# Patient Record
Sex: Female | Born: 1965 | Race: White | Hispanic: No | Marital: Married | Smoking: Never smoker
Health system: Southern US, Community
[De-identification: ages and names within clinical notes are randomized; demographics above are authoritative.]

## PROBLEM LIST (undated history)

## (undated) DIAGNOSIS — E785 Hyperlipidemia, unspecified: Secondary | ICD-10-CM

## (undated) DIAGNOSIS — F329 Major depressive disorder, single episode, unspecified: Secondary | ICD-10-CM

## (undated) DIAGNOSIS — J302 Other seasonal allergic rhinitis: Secondary | ICD-10-CM

## (undated) DIAGNOSIS — G43909 Migraine, unspecified, not intractable, without status migrainosus: Secondary | ICD-10-CM

## (undated) DIAGNOSIS — I10 Essential (primary) hypertension: Secondary | ICD-10-CM

## (undated) DIAGNOSIS — C801 Malignant (primary) neoplasm, unspecified: Secondary | ICD-10-CM

## (undated) DIAGNOSIS — F419 Anxiety disorder, unspecified: Secondary | ICD-10-CM

## (undated) DIAGNOSIS — F32A Depression, unspecified: Secondary | ICD-10-CM

## (undated) DIAGNOSIS — E119 Type 2 diabetes mellitus without complications: Secondary | ICD-10-CM

## (undated) DIAGNOSIS — T148XXA Other injury of unspecified body region, initial encounter: Secondary | ICD-10-CM

## (undated) DIAGNOSIS — K219 Gastro-esophageal reflux disease without esophagitis: Secondary | ICD-10-CM

## (undated) HISTORY — DX: Other seasonal allergic rhinitis: J30.2

## (undated) HISTORY — DX: Type 2 diabetes mellitus without complications: E11.9

## (undated) HISTORY — PX: EYE SURGERY: SHX253

## (undated) HISTORY — DX: Gastro-esophageal reflux disease without esophagitis: K21.9

## (undated) HISTORY — DX: Anxiety disorder, unspecified: F41.9

## (undated) HISTORY — DX: Migraine, unspecified, not intractable, without status migrainosus: G43.909

## (undated) HISTORY — DX: Depression, unspecified: F32.A

## (undated) HISTORY — DX: Essential (primary) hypertension: I10

## (undated) HISTORY — DX: Hyperlipidemia, unspecified: E78.5

## (undated) HISTORY — DX: Major depressive disorder, single episode, unspecified: F32.9

## (undated) HISTORY — DX: Malignant (primary) neoplasm, unspecified: C80.1

## (undated) HISTORY — DX: Other injury of unspecified body region, initial encounter: T14.8XXA

---
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
## 2001-07-13 ENCOUNTER — Other Ambulatory Visit: Admission: RE | Admit: 2001-07-13 | Discharge: 2001-07-13 | Payer: Self-pay | Admitting: Obstetrics and Gynecology

## 2002-06-11 ENCOUNTER — Ambulatory Visit (HOSPITAL_COMMUNITY): Admission: RE | Admit: 2002-06-11 | Discharge: 2002-06-11 | Payer: Self-pay | Admitting: Gastroenterology

## 2002-10-03 HISTORY — PX: ABDOMINAL HYSTERECTOMY: SHX81

## 2003-09-09 ENCOUNTER — Encounter: Admission: RE | Admit: 2003-09-09 | Discharge: 2003-12-08 | Payer: Self-pay | Admitting: Internal Medicine

## 2004-01-22 ENCOUNTER — Encounter: Admission: RE | Admit: 2004-01-22 | Discharge: 2004-04-21 | Payer: Self-pay | Admitting: Internal Medicine

## 2007-08-09 ENCOUNTER — Encounter: Admission: RE | Admit: 2007-08-09 | Discharge: 2007-08-09 | Payer: Self-pay | Admitting: Gastroenterology

## 2010-12-24 ENCOUNTER — Other Ambulatory Visit: Payer: Self-pay | Admitting: Family Medicine

## 2010-12-24 ENCOUNTER — Ambulatory Visit (HOSPITAL_COMMUNITY)
Admission: RE | Admit: 2010-12-24 | Discharge: 2010-12-24 | Disposition: A | Discharge status: Home / Self Care | Payer: BC Managed Care – PPO | Source: Ambulatory Visit | Attending: Family Medicine | Admitting: Family Medicine

## 2010-12-24 DIAGNOSIS — M7989 Other specified soft tissue disorders: Secondary | ICD-10-CM

## 2011-02-18 NOTE — Op Note (Signed)
   NAME:  Natalie Richard, Natalie Richard                      ACCOUNT NO.:  [id number]   MEDICAL RECORD NO.:  [id number]                   PATIENT TYPE:  AMB   LOCATION:  ENDO                                 FACILITY:  [facility]   PHYSICIAN:  Juanita Craver, M.D.                   DATE OF BIRTH:  Jul 09, 1966   DATE OF PROCEDURE:  06/11/2002  DATE OF DISCHARGE:                                 OPERATIVE REPORT   PROCEDURE:  Screening colonoscopy.   ENDOSCOPIST:  Juanita Craver, M.D.   INSTRUMENT USED:  Olympus video colonoscope.   INDICATIONS FOR PROCEDURE:  A 45 year old white female with a female history  of colon cancer in her mother and a family history of breast cancer in her  maternal grandmother, rule out colonic polyps, masses, hemorrhoids, etc.   PREPROCEDURE PREPARATION:  Informed consent was procured from the patient.  The patient fasted for eight hours prior to the procedure and prepped with a  bottle of magnesium citrate and a gallon of NuLytely the night prior to the  procedure.   PREPROCEDURE PHYSICAL:  The patient had stable vital signs. Neck supple.  Chest clear to auscultation. S1, S2 regular. Abdomen soft with normal bowel  sounds.   DESCRIPTION OF PROCEDURE:  The patient was placed in the left lateral  decubitus position, sedated with 100 mg of Demerol and 10 mg of Versed  intravenously. Once the patient was adequately sedated and maintained on low  flow oxygen and continuous cardia monitoring, the Olympus video colonoscope  was advanced from the rectum to the cecum and terminal ileum without  difficulty. The entire exam showed no evidence of masses, polyps or  diverticulosis. No erosions, ulcerations, etc were seen. The patient  tolerated the procedure well without complications. There was no evidence of  hemorrhoids.   IMPRESSION:  Normal colonoscopy of the terminal ileum.   RECOMMENDATIONS:  1. Repeat colorectal cancer screening was recommended in the next five years  unless the patient develops any     abnormal symptoms in the interim.  2. High fiber diet with liberal fluid intake has been recommended.  3. Outpatient follow-up on a p.r.n. basis.                                               Juanita Craver, M.D.    JM/MEDQ  D:  06/11/2002  T:  06/12/2002  Job:  [id number]   cc:   Beckey Downing, M.D.  [address] [place], [place] [address]  Fax: [phone]

## 2011-09-15 ENCOUNTER — Ambulatory Visit (INDEPENDENT_AMBULATORY_CARE_PROVIDER_SITE_OTHER): Payer: BC Managed Care – PPO

## 2011-09-15 DIAGNOSIS — F458 Other somatoform disorders: Secondary | ICD-10-CM

## 2011-09-15 DIAGNOSIS — R0602 Shortness of breath: Secondary | ICD-10-CM

## 2011-09-15 DIAGNOSIS — J029 Acute pharyngitis, unspecified: Secondary | ICD-10-CM

## 2011-09-23 ENCOUNTER — Ambulatory Visit (INDEPENDENT_AMBULATORY_CARE_PROVIDER_SITE_OTHER): Payer: BC Managed Care – PPO

## 2011-09-23 DIAGNOSIS — R059 Cough, unspecified: Secondary | ICD-10-CM

## 2011-09-23 DIAGNOSIS — R05 Cough: Secondary | ICD-10-CM

## 2011-09-23 DIAGNOSIS — J019 Acute sinusitis, unspecified: Secondary | ICD-10-CM

## 2011-10-19 ENCOUNTER — Ambulatory Visit (INDEPENDENT_AMBULATORY_CARE_PROVIDER_SITE_OTHER): Payer: BC Managed Care – PPO

## 2011-10-19 DIAGNOSIS — S300XXA Contusion of lower back and pelvis, initial encounter: Secondary | ICD-10-CM

## 2011-10-22 ENCOUNTER — Ambulatory Visit (INDEPENDENT_AMBULATORY_CARE_PROVIDER_SITE_OTHER): Payer: BC Managed Care – PPO

## 2011-10-22 DIAGNOSIS — S300XXA Contusion of lower back and pelvis, initial encounter: Secondary | ICD-10-CM

## 2011-10-22 DIAGNOSIS — E7800 Pure hypercholesterolemia, unspecified: Secondary | ICD-10-CM

## 2011-10-27 ENCOUNTER — Ambulatory Visit (INDEPENDENT_AMBULATORY_CARE_PROVIDER_SITE_OTHER): Payer: BC Managed Care – PPO | Admitting: General Surgery

## 2011-10-27 ENCOUNTER — Encounter (INDEPENDENT_AMBULATORY_CARE_PROVIDER_SITE_OTHER): Payer: Self-pay | Admitting: General Surgery

## 2011-10-27 VITALS — BP 128/88 | HR 88 | Temp 97.2°F | Resp 20 | Ht 66.0 in | Wt 228.8 lb

## 2011-10-27 DIAGNOSIS — T148XXA Other injury of unspecified body region, initial encounter: Secondary | ICD-10-CM | POA: Insufficient documentation

## 2011-10-27 MED ORDER — HYDROCODONE-ACETAMINOPHEN 5-325 MG PO TABS: 1.0000 | ORAL_TABLET | ORAL | Status: AC | PRN

## 2011-10-27 NOTE — Patient Instructions (Signed)
Warm compresses to area Activity as tolerated

## 2011-10-27 NOTE — Progress Notes (Signed)
Subjective:     Patient ID: Natalie Richard, female   DOB: Jun 28, 1966, 46 y.o.   MRN: [id number]  HPI We're asked to see the patient in consultation by Dr. Edilia Bo to evaluate her for a hematoma on her right buttock. The patient is a 46 year old white female who fell on January 15 and landed on her right buttock area. She developed a large area of swelling and bruising on her right buttock. She denies any drainage. She denies any fevers or chills. She still has a lot of soreness in the area and finds it difficult to sit on that side. Her bowels are working normally.  Review of Systems  Constitutional: Negative.   HENT: Negative.   Eyes: Negative.   Respiratory: Negative.   Cardiovascular: Negative.   Gastrointestinal: Negative.   Genitourinary: Negative.   Musculoskeletal: Negative.   Skin: Negative.   Neurological: Negative.   Hematological: Negative.   Psychiatric/Behavioral: Negative.        Objective:   Physical Exam  Constitutional: She is oriented to person, place, and time. She appears well-developed and well-nourished.  HENT:  Head: Normocephalic and atraumatic.  Eyes: Conjunctivae and EOM are normal. Pupils are equal, round, and reactive to light.  Neck: Normal range of motion. Neck supple.  Cardiovascular: Normal rate, regular rhythm and normal heart sounds.   Pulmonary/Chest: Effort normal and breath sounds normal.  Abdominal: Soft. Bowel sounds are normal.  Musculoskeletal:       The patient has a large bruise with a hematoma in her right posterior buttock area. The skin is intact. There is no evidence of cellulitis  Neurological: She is alert and oriented to person, place, and time.  Skin: Skin is warm and dry.  Psychiatric: She has a normal mood and affect. Her behavior is normal.       Assessment:     The patient has a fairly large hematoma on her right posterior buttock area. There is no sign of infection or skin necrosis.    Plan:     At this point I  do not think this area needs to be surgically opened and drained. I recommended warm compresses to the area and good pain control. We will plan to see her back in 2 weeks to check her progress.

## 2011-11-01 ENCOUNTER — Ambulatory Visit (INDEPENDENT_AMBULATORY_CARE_PROVIDER_SITE_OTHER): Payer: BC Managed Care – PPO | Admitting: General Surgery

## 2011-11-08 ENCOUNTER — Encounter (INDEPENDENT_AMBULATORY_CARE_PROVIDER_SITE_OTHER): Payer: Self-pay | Admitting: General Surgery

## 2011-11-08 ENCOUNTER — Telehealth (INDEPENDENT_AMBULATORY_CARE_PROVIDER_SITE_OTHER): Payer: Self-pay | Admitting: General Surgery

## 2011-11-08 ENCOUNTER — Ambulatory Visit (INDEPENDENT_AMBULATORY_CARE_PROVIDER_SITE_OTHER): Payer: BC Managed Care – PPO | Admitting: General Surgery

## 2011-11-08 VITALS — BP 134/92 | HR 80 | Temp 97.8°F | Resp 18 | Ht 66.0 in | Wt 226.2 lb

## 2011-11-08 DIAGNOSIS — T148XXA Other injury of unspecified body region, initial encounter: Secondary | ICD-10-CM

## 2011-11-08 NOTE — Progress Notes (Addendum)
Subjective:     Patient ID: Natalie Richard, female   DOB: 06-20-1966, 46 y.o.   MRN: [id number]  HPI The patient is a 46 year old white female who we saw recently for a hematoma on her right buttock and gluteal cleft area. This apparently occurred when she fell off of a deck at a tavern. She continues to complain of some pain across her low back area. She feels as though the discoloration is improving. She does state that it feels a little bit better than it did originally. Occasionally she will get some pain down the back of her right leg. She states she has trouble sitting for long periods of time.  Review of Systems     Objective:   Physical Exam On exam the bruising and discoloration in her right buttock and gluteal cleft area is definitely improving. She still has evidence of a hematoma in the subcutaneous tissue. There is no sign of infection or ischemia of the overlying skin.    Assessment:     Large hematoma of the right buttock area after a fall    Plan:     At this point I would be willing to put her at work a couple more weeks and she still having some pain. I have offered to refer her also to a neurosurgeon to evaluate the radiating pain in her right leg but she has declined for now. It is likely that the hematoma simply compressing some of the nerves in that area and this should get better as the hematoma resolves. We will plan to see her back in about 2-3 weeks to check her progress

## 2011-11-08 NOTE — Telephone Encounter (Signed)
Patient called after receiving a copy of her note from her visit today 11/08/2011. She states where it says "This apparently occurred when she fell off of a deck at a bar." She would like this amended. She states she was at a [employer] and went to walk out of the back door and there was not a step, so she fell. She wants her record to state this and wants the part about falling off a deck at a bar removed. Please let patient know when this happens. Thanks.

## 2011-11-08 NOTE — Patient Instructions (Signed)
Continue warm compresses and rest

## 2011-11-08 NOTE — Telephone Encounter (Signed)
DR TOTH WILL BE OUT OF OFFICE UNTIL NEXT WEEK/ I WILL REMIND HIM TO REVIEW THIS MESSAGE NEXT WEEK/GY. I WILL ALSO NOTIFY PT./GY

## 2011-11-09 ENCOUNTER — Telehealth (INDEPENDENT_AMBULATORY_CARE_PROVIDER_SITE_OTHER): Payer: Self-pay | Admitting: General Surgery

## 2011-11-09 NOTE — Telephone Encounter (Signed)
i tried to call pt re pain med and voicfryare mess said [facility] not accetting calls at this time. i will try tomorrow/gy

## 2011-11-09 NOTE — Telephone Encounter (Signed)
I RECEIVED A MESSAGE FROM FRONT DESK FROM Natalie Richard RE DIFFERENT PAIN MEDICATION/ I TRIED TO CONTACT PT WITH # GIVEN AND SHE WAS NOT ACCEPTING MESSAGES AT THIS TIME. I WILL TRY IN AM/GY

## 2011-11-10 NOTE — Telephone Encounter (Signed)
I TRIED TO CONTACT PT THIS AM AT [facility] PHONE # AND RECORDING SAYS SHE IS NOT ACCEPTING CALLS AT THIS TIME/GY

## 2011-11-13 ENCOUNTER — Telehealth: Payer: Self-pay

## 2011-11-13 NOTE — Telephone Encounter (Signed)
Pt would like to request a copy of the picture we took of her hematoma to give to her attorney.

## 2011-11-14 ENCOUNTER — Telehealth (INDEPENDENT_AMBULATORY_CARE_PROVIDER_SITE_OTHER): Payer: Self-pay | Admitting: General Surgery

## 2011-11-14 NOTE — Telephone Encounter (Signed)
I CALLED MS [place] AT 12:15PM AND SHE SAID SHE HAS BEEN SCHEDULED TO SEE DR. Donne Hazel TOMORROW IN URGENT OFFICE FOR HEMATOMA PAIN/GY

## 2011-11-14 NOTE — Telephone Encounter (Signed)
i don't know the difference between a bar and a tavern.

## 2011-11-15 ENCOUNTER — Ambulatory Visit (INDEPENDENT_AMBULATORY_CARE_PROVIDER_SITE_OTHER): Payer: BC Managed Care – PPO | Admitting: General Surgery

## 2011-11-15 ENCOUNTER — Encounter (INDEPENDENT_AMBULATORY_CARE_PROVIDER_SITE_OTHER): Payer: Self-pay | Admitting: General Surgery

## 2011-11-15 VITALS — BP 130/70 | HR 60 | Resp 16 | Ht 66.0 in | Wt 227.0 lb

## 2011-11-15 DIAGNOSIS — T148XXA Other injury of unspecified body region, initial encounter: Secondary | ICD-10-CM

## 2011-11-15 NOTE — Progress Notes (Signed)
Subjective:     Patient ID: Natalie Richard, female   DOB: December 11, 1965, 46 y.o.   MRN: [id number]  HPI The patient is a 46 year old white female who has been seen by Dr. Marlou Starks for a hematoma on her right buttock and gluteal cleft area. This apparently occurred when she fell off of a deck at a bar. She continues to complain of some pain across her low back area. She has some pain at this area and complains of some leg pain also.  She does not like the bump being there and comes in today wanting this aspirated.   Review of Systems     Objective:   Physical Exam Lower back/upper gluteal cleft hematoma that appears resolving with 10x10 cm mass effect, skin all intact and not tense    Assessment:     Buttock/lower back hematoma    Plan:     There is no acute indication for drainage right now. Will have her follow up with Dr. Marlou Starks for plan

## 2011-11-15 NOTE — Telephone Encounter (Signed)
Notified pt that copy is ready for p/up. Pt requested 2nd copy - copied and both copies in drawer for p/up.

## 2011-11-15 NOTE — Patient Instructions (Signed)
Follow up with Dr. Marlou Starks as planned

## 2011-11-18 ENCOUNTER — Telehealth (INDEPENDENT_AMBULATORY_CARE_PROVIDER_SITE_OTHER): Payer: Self-pay

## 2011-11-18 NOTE — Telephone Encounter (Signed)
Patient call at 5:00 PM wanting to be seen. Urgent office is now completed / finished.  She has a follow up Monday with Dr. Marlou Starks for buttock pain. She was advised to see an urgent care center or possible the OR for treatment in the meantime. If she could not wait until [facility] appointment. She saw Dr. Donne Hazel 11/15/2011 for the same problems. The office notes where discussed in the conversation.

## 2011-11-21 ENCOUNTER — Encounter (INDEPENDENT_AMBULATORY_CARE_PROVIDER_SITE_OTHER): Payer: Self-pay | Admitting: General Surgery

## 2011-11-21 ENCOUNTER — Ambulatory Visit (INDEPENDENT_AMBULATORY_CARE_PROVIDER_SITE_OTHER): Payer: BC Managed Care – PPO | Admitting: General Surgery

## 2011-11-21 VITALS — BP 142/94 | HR 78 | Temp 97.6°F | Resp 12 | Ht 66.0 in | Wt 225.6 lb

## 2011-11-21 DIAGNOSIS — T148XXA Other injury of unspecified body region, initial encounter: Secondary | ICD-10-CM

## 2011-11-21 NOTE — Patient Instructions (Signed)
Continue warm compresses Baggy clothes are ok if it is more comfortable

## 2011-11-21 NOTE — Telephone Encounter (Signed)
FYI- DR. TOTH CHANGED BAR TO TAVERN IN HIS DICTATION/GY

## 2011-11-21 NOTE — Progress Notes (Signed)
Subjective:     Patient ID: Natalie Richard, female   DOB: [birth year], 46 y.o.   MRN: [id number]  HPI The patient is a 46 year old white female who to clarify was walking out the back door of a tavern onto a deck and there was apparently a large step off and she fell onto the deck on her backside. She has had a bruising and hematoma on her right gluteal cleft area. This is slowly improving. She still has a lot of pain when she wears clothing that is a little bit tight.  Review of Systems     Objective:   Physical Exam On exam the area of the hematoma actually feels a little bit softer to me today. A lot of the bruising and soft tissue is gradually fading. There is no sign of infection or necrosis of the skin.    Assessment:     Status post fall with hematoma of her right buttock area    Plan:     I believe this will gradually resolve with time. I've encouraged to warm compresses and comfortable closing. We will see her back in about 2 months to check her progress.

## 2011-11-24 ENCOUNTER — Telehealth: Payer: Self-pay

## 2011-11-24 ENCOUNTER — Telehealth (INDEPENDENT_AMBULATORY_CARE_PROVIDER_SITE_OTHER): Payer: Self-pay

## 2011-11-24 NOTE — Telephone Encounter (Signed)
Please contact patient concerning medications

## 2011-11-24 NOTE — Telephone Encounter (Signed)
Pt called to request more/stronger pain medication.  She states the pain is radiating from her right buttock down her right leg.  She is using the moist heat.  Dr. Marlou Starks denied the request for pain medication and instructed me to relay to the patient that this hematoma will take months to completely resolve.  I advised her to rest completely, continue with the pain medication and moist heat until she returns to work this coming Monday.  She agreed.

## 2011-11-25 ENCOUNTER — Telehealth (INDEPENDENT_AMBULATORY_CARE_PROVIDER_SITE_OTHER): Payer: Self-pay | Admitting: General Surgery

## 2011-11-25 NOTE — Telephone Encounter (Signed)
Natalie Richard CALLED TO ASK IF SHE COULD RETURN TO WORK ON Monday 11-28-11 WITH I/2 DAY RESTRICTION FOR 1 WEEK TO SEE HOW SHE TOLERATES SITTING FOR I/2 DAY. SHE STATES" SHE DOES NOT HAVE THE OPTION TO LEAVE IF SCHEDULED FOR FULL DAY AND SHE IS EXPERIENCING PAIN". DR. Marlou Starks OK'D I/2 DAYY FOR NEXT WEEK AND I FAXED NOTE TO THAT EFFECT TO BARBARA REECE/ [phone] PER PT REQUEST/GY

## 2011-11-26 NOTE — Telephone Encounter (Signed)
Spoke with patient, she states that her surgeon is out of town and she wants her pain meds refilled.  Advised that we could not do this, it must be obtained from surgeon on call from their office.  Patient understood.

## 2011-11-26 NOTE — Telephone Encounter (Signed)
LMOM to CB. 

## 2011-11-27 ENCOUNTER — Telehealth: Payer: Self-pay

## 2011-11-27 MED ORDER — BUPROPION HCL ER (XL) 150 MG PO TB24: 150.0000 mg | ORAL_TABLET | Freq: Every day | ORAL | Status: DC

## 2011-11-27 MED ORDER — PANTOPRAZOLE SODIUM 40 MG PO TBEC: 40.0000 mg | DELAYED_RELEASE_TABLET | Freq: Every day | ORAL | Status: DC

## 2011-11-27 NOTE — Telephone Encounter (Signed)
Chart was sent to med records, but no one is currently in med records...  So I was able to get the chart and review it with Judson Roch.  Sarah ok'd rxing 3 months of Protonix and Wellbutrin XL.  [facility] notifying patient meds sent in.

## 2011-11-27 NOTE — Telephone Encounter (Signed)
Pt is losing her health insurance tomorrow.  Would like to refill prescriptions on 90 day supply today.  Well Butrin XL 150 mg  AND  She would like to substitute for nexium because it is so expensive even with insurance.

## 2011-11-27 NOTE — Telephone Encounter (Signed)
Spoke with patient, she states she is only needing refills on her Wellbutrin and GERD med (requests cheaper med than Nexium).  Would like 90 day supply of these to carry her through until her insurance resumes.  Ok on other meds.  She plans to RTC in next month or two for  Recheck/labs.  Can we rx?

## 2011-11-27 NOTE — Telephone Encounter (Signed)
Need chart, Wellbutrin not listed on her list of meds.

## 2011-11-27 NOTE — Telephone Encounter (Signed)
Pt calling to say she needs refill on her nexium but needs something cheaper would like someone to call her as soon as possible

## 2011-12-01 ENCOUNTER — Ambulatory Visit (INDEPENDENT_AMBULATORY_CARE_PROVIDER_SITE_OTHER): Payer: BC Managed Care – PPO | Admitting: Family Medicine

## 2011-12-01 VITALS — BP 120/89 | HR 81 | Temp 98.1°F | Resp 16 | Ht 65.0 in | Wt 222.0 lb

## 2011-12-01 DIAGNOSIS — J329 Chronic sinusitis, unspecified: Secondary | ICD-10-CM

## 2011-12-01 DIAGNOSIS — T148XXA Other injury of unspecified body region, initial encounter: Secondary | ICD-10-CM

## 2011-12-01 MED ORDER — AMOXICILLIN 500 MG PO CAPS: 1000.0000 mg | ORAL_CAPSULE | Freq: Two times a day (BID) | ORAL | Status: AC

## 2011-12-01 NOTE — Progress Notes (Signed)
Patient Name: Natalie Richard Date of Birth: [birth year] Medical Record Number: [id number] Gender: female Date of Encounter: 12/01/2011  History of Present Illness:  Natalie Richard is a 46 y.o. very pleasant female patient who presents with the following:  See last several OV- she did fall in January and suffered a large bruise/ hematoma on her behind.  She consulted with Dr. Marlou Starks and ended up needing to be out of work for several weeks.  In the end she did lose her job and her FMLA was denied.  She has sought legal counsel and plans to fight this decision.  Her hematoma is persistent but is being treated conservatively.    She is here today due to a "sinus problem or cold."  Notes symptoms for about one week- started with a ST which resolved- then developed a runny nose, discolored nasal mucus, facial pain and pressure.  Has used mucinex for aboiut 3 days.  Cough at night only.  Mild ear discomfort which comes and goes. No GI symptoms except for intermittent nausea.    Patient Active Problem List  Diagnoses  . Hematoma   Past Medical History  Diagnosis Date  . Cancer     skin  . Diabetes mellitus   . GERD (gastroesophageal reflux disease)   . Hyperlipidemia   . Hypertension   . Hematoma     on buttock   Past Surgical History  Procedure Date  . Abdominal hysterectomy 2004   History  Substance Use Topics  . Smoking status: Never Smoker   . Smokeless tobacco: Never Used  . Alcohol Use: Yes   Family History  Problem Relation Age of Onset  . Cancer Mother     colon  . Cancer Father     bone and prostate  . Cancer Maternal Grandmother     breast   Allergies  Allergen Reactions  . Peridin-C Rash   As per HPI- otherwise negative.  Medication list has been reviewed and updated.  Review of Systems: GEN: WDWN, NAD, Non-toxic, A & O x 3, obese HEENT: Atraumatic, Normocephalic. Neck supple. No masses, No LAD. TM wnl, oropharynx wnl Ears and Nose: No external  deformity. CV: RRR, No M/G/R. No JVD. No thrill. No extra heart sounds. PULM: CTA B, no wheezes, crackles, rhonchi. No retractions. No resp. distress. No accessory muscle use. ABD: S, NT, ND, +BS. No rebound. No HSM. Still has non- tender hematoma above her gluteal cleft but it is a lot better! EXTR: No c/c/e NEURO Normal gait.  PSYCH: Normally interactive. Conversant. Not depressed or anxious appearing.  Calm demeanor.    Physical Examination: Filed Vitals:   12/01/11 1545  BP: 120/89  Pulse: 81  Temp: 98.1 F (36.7 C)  TempSrc: Oral  Resp: 16  Height: 5' 5"  (1.651 m)  Weight: 222 lb (100.699 kg)    Body mass index is 36.94 kg/(m^2).  GEN: WDWN, NAD, Non-toxic, A & O x 3, obesity HEENT: Atraumatic, Normocephalic. Neck supple. No masses, No LAD. TM wnl, oropharynx wnl, frontal sinuses tender and nasal congestion Ears and Nose: No external deformity. CV: RRR, No M/G/R. No JVD. No thrill. No extra heart sounds. PULM: CTA B, no wheezes, crackles, rhonchi. No retractions. No resp. distress. No accessory muscle use. ABD: S, NT, ND, +BS. No rebound. No HSM. EXTR: No c/c/e NEURO Normal gait.  PSYCH: Normally interactive. Conversant. Not depressed or anxious appearing.  Calm demeanor.    Assessment and Plan: 1. Sinusitis  amoxicillin (AMOXIL) 500  MG capsule  2. Hematoma     Cover for sinusitis as above- Patient (or parent if minor) instructed to return to clinic or call if not better in 3-4 day(s). She is working on resolving her issue with her former employer.  Let us know if we can help.

## 2011-12-03 ENCOUNTER — Other Ambulatory Visit: Payer: Self-pay

## 2011-12-03 MED ORDER — PROMETHAZINE-DM 6.25-15 MG/5ML PO SYRP: 5.0000 mL | ORAL_SOLUTION | Freq: Four times a day (QID) | ORAL | Status: AC | PRN

## 2011-12-03 NOTE — Telephone Encounter (Signed)
ADVISED PT THAT RX WAS SENT IN

## 2011-12-03 NOTE — Telephone Encounter (Signed)
Pt is requesting cough meds

## 2011-12-14 ENCOUNTER — Telehealth: Payer: Self-pay

## 2011-12-14 NOTE — Telephone Encounter (Signed)
Pt calling to see if we can look in her chart and let her know where it said her injury occurred before everthing gets sent to her attorney

## 2011-12-15 NOTE — Telephone Encounter (Signed)
Gave pt info she needed as written in her OV notes in chart.

## 2011-12-16 DIAGNOSIS — Z0271 Encounter for disability determination: Secondary | ICD-10-CM

## 2011-12-22 ENCOUNTER — Telehealth: Payer: Self-pay

## 2011-12-22 MED ORDER — AMOXICILLIN 875 MG PO TABS: 875.0000 mg | ORAL_TABLET | Freq: Two times a day (BID) | ORAL | Status: AC

## 2011-12-22 NOTE — Telephone Encounter (Signed)
Pt completed the first round of her antibodics and believes that she might need more-the symptoms have returned  Best number [phone]   [facility] [address]

## 2011-12-22 NOTE — Telephone Encounter (Signed)
12/01/11 she requesting something called in. Please advise

## 2011-12-22 NOTE — Telephone Encounter (Signed)
Patient finished amoxicillin and about a week later sxs started to flare back up again and now full force. Cough/productive/green, head congestion/green mucous. Doesn't think amoxicillian worked so requesting something else. She doesn't have insurance and cant afford to come back in for the same thing was here for

## 2011-12-22 NOTE — Telephone Encounter (Signed)
Sent in Amox at higher dose.  Notify pt.

## 2011-12-22 NOTE — Telephone Encounter (Signed)
Pt.notified

## 2011-12-22 NOTE — Telephone Encounter (Signed)
Pt called back and wanted to know if we could call in some more cough syrup as well

## 2011-12-24 NOTE — Telephone Encounter (Signed)
PT STATES THAT SHE IS ACUTALLY TAKING SOMETHING OTC THAT IS HELPING

## 2011-12-24 NOTE — Telephone Encounter (Signed)
Pt seen 2/28 - if still having a cough needs OV.

## 2011-12-28 ENCOUNTER — Telehealth (INDEPENDENT_AMBULATORY_CARE_PROVIDER_SITE_OTHER): Payer: Self-pay

## 2011-12-28 NOTE — Telephone Encounter (Signed)
Patient called: Cell phone not accepting calls at this time.

## 2012-01-04 ENCOUNTER — Other Ambulatory Visit: Payer: Self-pay | Admitting: Emergency Medicine

## 2012-01-04 NOTE — Telephone Encounter (Signed)
montelukast (SINGULAIR) 10 MG tablet   Pt calling we have not responded [facility] [address]   Call pt

## 2012-01-04 NOTE — Telephone Encounter (Signed)
Rx sent in, patient plans to recheck Monday with Dr. Laney Pastor.

## 2012-01-09 ENCOUNTER — Ambulatory Visit (INDEPENDENT_AMBULATORY_CARE_PROVIDER_SITE_OTHER): Payer: BC Managed Care – PPO | Admitting: Internal Medicine

## 2012-01-09 ENCOUNTER — Telehealth: Payer: Self-pay

## 2012-01-09 ENCOUNTER — Other Ambulatory Visit: Payer: Self-pay | Admitting: Internal Medicine

## 2012-01-09 VITALS — BP 116/81 | HR 82 | Temp 98.4°F | Resp 16 | Ht 66.0 in | Wt 226.0 lb

## 2012-01-09 DIAGNOSIS — F32A Depression, unspecified: Secondary | ICD-10-CM | POA: Insufficient documentation

## 2012-01-09 DIAGNOSIS — F329 Major depressive disorder, single episode, unspecified: Secondary | ICD-10-CM | POA: Insufficient documentation

## 2012-01-09 DIAGNOSIS — M8580 Other specified disorders of bone density and structure, unspecified site: Secondary | ICD-10-CM | POA: Insufficient documentation

## 2012-01-09 DIAGNOSIS — F341 Dysthymic disorder: Secondary | ICD-10-CM

## 2012-01-09 DIAGNOSIS — I10 Essential (primary) hypertension: Secondary | ICD-10-CM

## 2012-01-09 DIAGNOSIS — J309 Allergic rhinitis, unspecified: Secondary | ICD-10-CM | POA: Insufficient documentation

## 2012-01-09 DIAGNOSIS — E785 Hyperlipidemia, unspecified: Secondary | ICD-10-CM

## 2012-01-09 DIAGNOSIS — G4700 Insomnia, unspecified: Secondary | ICD-10-CM | POA: Insufficient documentation

## 2012-01-09 DIAGNOSIS — E119 Type 2 diabetes mellitus without complications: Secondary | ICD-10-CM

## 2012-01-09 DIAGNOSIS — G43909 Migraine, unspecified, not intractable, without status migrainosus: Secondary | ICD-10-CM | POA: Insufficient documentation

## 2012-01-09 DIAGNOSIS — F419 Anxiety disorder, unspecified: Secondary | ICD-10-CM

## 2012-01-09 LAB — POCT CBC
MCH, POC: 29.6 pg (ref 27–31.2)
MCHC: 35.2 g/dL (ref 31.8–35.4)
MCV: 84.2 fL (ref 80–97)
MPV: 7.3 fL (ref 0–99.8)
POC Granulocyte: 4.4 (ref 2–6.9)
POC LYMPH PERCENT: 31 %L (ref 10–50)
Platelet Count, POC: 385 10*3/uL (ref 142–424)
RBC: 4.22 M/uL (ref 4.04–5.48)
RDW, POC: 16.5 %
WBC: 7.1 10*3/uL (ref 4.6–10.2)

## 2012-01-09 LAB — POCT GLYCOSYLATED HEMOGLOBIN (HGB A1C): Hemoglobin A1C: 5.7

## 2012-01-09 MED ORDER — CITALOPRAM HYDROBROMIDE 40 MG PO TABS: 40.0000 mg | ORAL_TABLET | Freq: Every day | ORAL | Status: DC

## 2012-01-09 NOTE — Telephone Encounter (Signed)
Pt called wanting her bloodwork sent to [facility] but blood is already en route to [facility]. Unable to get it back. So per Chelle ok to cancel the order at [facility] and pt can come back later on this week (because her ins runs out on Sat) and get her blood re drawn and sent to [facility] no charge

## 2012-01-09 NOTE — Progress Notes (Signed)
  Subjective:    Patient ID: Natalie Richard, female    DOB: 1966/08/06, 46 y.o.   MRN: [id number]  HPIHere for followup of diabetes and hyperlipidemia to see if a change in medication has been effective. She had to discontinue Crestor and change to Lipitor due to insurance. We also are checking the effective reducing Actos to 15 mg  Following her injury from a fall in January she was fired even though she was out of work on [employer]. She is pursuing this legally.  She has been treated with 2 rounds of antibiotics for sinus infection and now and just has clear rhinorrhea with sneezing and itching of eyes-she is on her full allergy regimen at this point   Review of SystemsHer insomnia and anxiety are surprisingly well controlled at this point without other symptoms/she denies recent migraines/no chest pain or palpitations/no myalgias/reflex seems controlled even after a change from Nexium to Protonix She has not been able to lose weight as directed  Family history: Her husband has just been laid off from his job again     Objective:   Physical Exam Vital signs are stable except for weight HEENT reveals boggy turbinates with clear rhinorrhea Heart is regular There is no peripheral edema and no peripheral sensory loss     Results for orders placed in visit on 01/09/12  POCT CBC      Component Value Range   WBC 7.1  4.6 - 10.2 (K/uL)   Lymph, poc 2.2  0.6 - 3.4    POC LYMPH PERCENT 31.0  10 - 50 (%L)   MID (cbc) 0.5  0 - 0.9    POC MID % 6.8  0 - 12 (%M)   POC Granulocyte 4.4  2 - 6.9    Granulocyte percent 62.2  37 - 80 (%G)   RBC 4.22  4.04 - 5.48 (M/uL)   Hemoglobin 12.5  12.2 - 16.2 (g/dL)   HCT, POC 35.5 (*) 37.7 - 47.9 (%)   MCV 84.2  80 - 97 (fL)   MCH, POC 29.6  27 - 31.2 (pg)   MCHC 35.2  31.8 - 35.4 (g/dL)   RDW, POC 16.5     Platelet Count, POC 385  142 - 424 (K/uL)   MPV 7.3  0 - 99.8 (fL)  POCT GLYCOSYLATED HEMOGLOBIN (HGB A1C)      Component Value Range   Hemoglobin A1C 5.7      Assessment & Plan:  Problem #1 diabetes-A1c is very good Continue Actos 15/if she can lose weight we may be able to discontinue medication  Problem #2 hyperlipidemia Check labs and call  Problem #3 anxiety and depression with insomnia Celexa 40 mg refilled/May call for help isolate and Wellbutrin   problem #4Allergic rhinitis No change in meds may call for refills/Singulair Veramyst Claritin  Problem #5 hypertension Call for hydrochlorothiazide 12.5 mg

## 2012-01-10 ENCOUNTER — Other Ambulatory Visit (INDEPENDENT_AMBULATORY_CARE_PROVIDER_SITE_OTHER): Payer: BC Managed Care – PPO

## 2012-01-10 DIAGNOSIS — J309 Allergic rhinitis, unspecified: Secondary | ICD-10-CM

## 2012-01-10 DIAGNOSIS — F341 Dysthymic disorder: Secondary | ICD-10-CM

## 2012-01-10 DIAGNOSIS — E785 Hyperlipidemia, unspecified: Secondary | ICD-10-CM

## 2012-01-10 DIAGNOSIS — I10 Essential (primary) hypertension: Secondary | ICD-10-CM

## 2012-01-10 DIAGNOSIS — E119 Type 2 diabetes mellitus without complications: Secondary | ICD-10-CM

## 2012-01-10 LAB — COMPREHENSIVE METABOLIC PANEL
AST: 22 U/L (ref 0–37)
BUN: 16 mg/dL (ref 6–23)
CO2: 28 mEq/L (ref 19–32)
Calcium: 9.5 mg/dL (ref 8.4–10.5)
Glucose, Bld: 94 mg/dL (ref 70–99)
Potassium: 3.7 mEq/L (ref 3.5–5.3)
Sodium: 139 mEq/L (ref 135–145)

## 2012-01-10 LAB — LIPID PANEL: HDL: 63 mg/dL (ref >39)

## 2012-01-11 ENCOUNTER — Encounter: Payer: Self-pay | Admitting: Internal Medicine

## 2012-01-15 ENCOUNTER — Telehealth: Payer: Self-pay

## 2012-01-15 NOTE — Telephone Encounter (Signed)
  PT SAYS SHE JUST RECEIVED RESULTS FROM [facility] BUT THEY WERE SUPPOSED TO COME FROM [facility] ....  BEST NUMBER IS [phone]

## 2012-01-23 ENCOUNTER — Encounter (INDEPENDENT_AMBULATORY_CARE_PROVIDER_SITE_OTHER): Payer: Self-pay | Admitting: General Surgery

## 2012-01-23 ENCOUNTER — Ambulatory Visit (INDEPENDENT_AMBULATORY_CARE_PROVIDER_SITE_OTHER): Payer: Self-pay | Admitting: General Surgery

## 2012-01-23 VITALS — BP 114/81 | HR 94 | Temp 96.4°F | Ht 66.0 in | Wt 230.4 lb

## 2012-01-23 DIAGNOSIS — T148XXA Other injury of unspecified body region, initial encounter: Secondary | ICD-10-CM

## 2012-01-23 NOTE — Progress Notes (Signed)
Subjective:     Patient ID: Natalie Richard, female   DOB: Jun 17, 1966, 46 y.o.   MRN: [id number]  HPI The patient is a 46 year old white female who sustained a fall approximately 3 months ago and developed a hematoma in her right gluteal cleft area. Over time the hematoma has gradually resolved and she is actually feeling much better now. She is able to move around more comfortably and where some regular clothes. She denies any fevers or chills.The patient states that she would like me to go ahead and discharge her so she can start her law suits against the eating establishment in the surgical center.  Review of Systems She denies any nausea, vomiting, fevers, chills, chest pain, shortness of breath, or diarrhea     Objective:   Physical Exam  Constitutional: She is oriented to person, place, and time. She appears well-developed and well-nourished.  HENT:  Head: Normocephalic and atraumatic.  Eyes: Conjunctivae and EOM are normal. Pupils are equal, round, and reactive to light.  Musculoskeletal: Normal range of motion.       The hematoma in her right gluteal cleft area is significantly smaller than it was at her last visit and much softer. She appears to have minimal tenderness associated with the area now.  Neurological: She is alert and oriented to person, place, and time.  Skin: Skin is warm and dry.       Assessment:     Status post hematoma in her right gluteal cleft area after a fall    Plan:     The hematoma is gradually resolving and she is tolerating this very well. At this point I think she can return to her normal activities without any restrictions and we will plan to see her back on a p.r.n. basis

## 2012-01-23 NOTE — Patient Instructions (Signed)
May resume activity as tolerated

## 2012-01-31 ENCOUNTER — Telehealth: Payer: Self-pay

## 2012-01-31 NOTE — Telephone Encounter (Signed)
Patient states that she is without insurance and is having panic attacks and cries all the time. Already on welbutrin, xanax two in the morning and two at night and celexa.  Would like to see if she needs to increase her meds or what?

## 2012-01-31 NOTE — Telephone Encounter (Signed)
PT WOULD LIKE A CALL BACK REGARDING SOME NEVOUS PANIC ATTACK SHE IS HAVING AND REALLY UPSET SINCE THIS IS THE FIRST TIME SHE HAVE BEEN WITHOUT INSURANCE PLEASE CALL [phone]

## 2012-02-01 NOTE — Telephone Encounter (Signed)
LMOM TO CB. UNABLE TO REACH PT ON CELL NUMBER.

## 2012-02-01 NOTE — Telephone Encounter (Signed)
It would be okay to send him prescriptions for Wellbutrin 300 XL, #30, 1 daily, 1 refill And Xanax 1.0 mg, #30, one 3 times a day, one refill

## 2012-02-01 NOTE — Telephone Encounter (Signed)
our chart says she takes Xanax once a day at 0.5 mg She is on maximum dose of Celexa/Could increase her Wellbutrin to 300 mg/could increase her Xanax to 1 mg 3 times a day when necessary If she wants to do this I can send this in the computer

## 2012-02-02 MED ORDER — BUPROPION HCL ER (XL) 300 MG PO TB24: 300.0000 mg | ORAL_TABLET | Freq: Every day | ORAL | Status: DC

## 2012-02-02 MED ORDER — ALPRAZOLAM 1 MG PO TABS: 1.0000 mg | ORAL_TABLET | Freq: Three times a day (TID) | ORAL | Status: DC | PRN

## 2012-02-02 NOTE — Telephone Encounter (Signed)
Patient notified and medication called in.    Dr. Laney Pastor,  Did you want patient to only have 10 day rx with 1 refill of Xanax?

## 2012-02-14 ENCOUNTER — Other Ambulatory Visit: Payer: Self-pay | Admitting: Internal Medicine

## 2012-03-01 ENCOUNTER — Ambulatory Visit (INDEPENDENT_AMBULATORY_CARE_PROVIDER_SITE_OTHER): Payer: BC Managed Care – PPO | Admitting: Family Medicine

## 2012-03-01 ENCOUNTER — Encounter: Payer: Self-pay | Admitting: Family Medicine

## 2012-03-01 ENCOUNTER — Other Ambulatory Visit: Payer: Self-pay | Admitting: Internal Medicine

## 2012-03-01 VITALS — BP 119/76 | HR 99 | Temp 98.3°F | Ht 66.0 in | Wt 230.0 lb

## 2012-03-01 DIAGNOSIS — E119 Type 2 diabetes mellitus without complications: Secondary | ICD-10-CM

## 2012-03-01 DIAGNOSIS — I10 Essential (primary) hypertension: Secondary | ICD-10-CM

## 2012-03-01 DIAGNOSIS — F419 Anxiety disorder, unspecified: Secondary | ICD-10-CM

## 2012-03-01 DIAGNOSIS — F32A Depression, unspecified: Secondary | ICD-10-CM

## 2012-03-01 DIAGNOSIS — J329 Chronic sinusitis, unspecified: Secondary | ICD-10-CM

## 2012-03-01 DIAGNOSIS — F341 Dysthymic disorder: Secondary | ICD-10-CM

## 2012-03-01 MED ORDER — AMOXICILLIN 500 MG PO CAPS: 500.0000 mg | ORAL_CAPSULE | Freq: Three times a day (TID) | ORAL | Status: AC

## 2012-03-01 NOTE — Patient Instructions (Signed)
It was great to meet you today! I am sorry you are not feeling well today.  I have sent in a prescription to the pharmacy for Amoxicillin. If you are still having pain after finishing antibiotic, please come back to see [place].  Also, make sure you see Clarice Pole to get the orange card.  Your ear may feel sore today and tomorrow, I am sorry about that!  Come back and see me in 3 months, or sooner if you need anything.  Take care, Galadriel Shroff M. Mckenlee Mangham, M.D.
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
## 2012-03-02 DIAGNOSIS — J329 Chronic sinusitis, unspecified: Secondary | ICD-10-CM | POA: Insufficient documentation

## 2012-03-02 NOTE — Assessment & Plan Note (Signed)
She is having facial pressure and exam consistent with acute sinusitis. She is prone to having sinus infections. Will treat with Amox 539m TID x 10 days. If she gets worse, starts having high fevers, or does not improve after treatment, she will return for evaluation.

## 2012-03-02 NOTE — Assessment & Plan Note (Signed)
Currently on Wellbutrin, Celexa and Xanax. Continue these for now. Discussed that I would like to eventually get her off Xanax. Will follow up with her in 2 months to see how she is doing. Hopefully social stressors will have improved by then and we can begin Xanax taper. Would also like to consider referring her to Dr. Gwenlyn Saran for evaluation once [facility] card is approved.

## 2012-03-02 NOTE — Assessment & Plan Note (Signed)
Stable. BP at goal in office today. She has had recent labs. Continue HCTZ 12.94m for now. If BP remains stable and she is able to lose some weight, she can come off her HCTZ. No changes made today.

## 2012-03-02 NOTE — Assessment & Plan Note (Signed)
Stable. Continue Actos for now. Encouraged her to exercise, and to diet as much as she can.

## 2012-03-19 ENCOUNTER — Ambulatory Visit (INDEPENDENT_AMBULATORY_CARE_PROVIDER_SITE_OTHER): Payer: Self-pay | Admitting: Internal Medicine

## 2012-03-19 VITALS — BP 117/79 | HR 86 | Temp 98.1°F | Resp 18 | Ht 66.5 in | Wt 227.8 lb

## 2012-03-19 DIAGNOSIS — T148XXA Other injury of unspecified body region, initial encounter: Secondary | ICD-10-CM

## 2012-03-19 DIAGNOSIS — Z6839 Body mass index (BMI) 39.0-39.9, adult: Secondary | ICD-10-CM | POA: Insufficient documentation

## 2012-03-19 DIAGNOSIS — E119 Type 2 diabetes mellitus without complications: Secondary | ICD-10-CM

## 2012-03-19 DIAGNOSIS — F341 Dysthymic disorder: Secondary | ICD-10-CM

## 2012-03-19 DIAGNOSIS — Z6836 Body mass index (BMI) 36.0-36.9, adult: Secondary | ICD-10-CM

## 2012-03-19 LAB — POCT CBC
Granulocyte percent: 62.1 %G (ref 37–80)
HCT, POC: 44.3 % (ref 37.7–47.9)
Hemoglobin: 14.2 g/dL (ref 12.2–16.2)
Lymph, poc: 2.1 (ref 0.6–3.4)
MCV: 86.9 fL (ref 80–97)
MID (cbc): 0.4 (ref 0–0.9)
MPV: 7.9 fL (ref 0–99.8)
POC Granulocyte: 4.2 (ref 2–6.9)
POC LYMPH PERCENT: 31.4 %L (ref 10–50)
Platelet Count, POC: 500 10*3/uL — AB (ref 142–424)
RBC: 5.1 M/uL (ref 4.04–5.48)
RDW, POC: 17.1 %

## 2012-03-19 LAB — POCT GLYCOSYLATED HEMOGLOBIN (HGB A1C): Hemoglobin A1C: 5.5

## 2012-03-19 MED ORDER — AMOXICILLIN 500 MG PO CAPS: 1000.0000 mg | ORAL_CAPSULE | Freq: Two times a day (BID) | ORAL | Status: AC

## 2012-03-19 MED ORDER — BUPROPION HCL ER (XL) 300 MG PO TB24: 300.0000 mg | ORAL_TABLET | Freq: Every day | ORAL | Status: DC

## 2012-03-19 MED ORDER — CLONAZEPAM 1 MG PO TABS: 1.0000 mg | ORAL_TABLET | Freq: Three times a day (TID) | ORAL | Status: DC | PRN

## 2012-03-19 MED ORDER — MELOXICAM 15 MG PO TABS: 15.0000 mg | ORAL_TABLET | Freq: Every day | ORAL | Status: DC
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
## 2012-04-06 ENCOUNTER — Telehealth: Payer: Self-pay | Admitting: Internal Medicine

## 2012-04-06 DIAGNOSIS — E785 Hyperlipidemia, unspecified: Secondary | ICD-10-CM

## 2012-04-06 MED ORDER — ATORVASTATIN CALCIUM 40 MG PO TABS: 40.0000 mg | ORAL_TABLET | Freq: Every day | ORAL | Status: DC

## 2012-04-06 NOTE — Telephone Encounter (Signed)
Dr. Laney Pastor  I sent copy of patient's labs from [facility] to patient and tried to call her, but unable to leave a messsage on her voicemail. Natalie Richard, Natalie Richard

## 2012-04-06 NOTE — Telephone Encounter (Signed)
Labs done at [facility] diagnostic= cholesterol 249/triglycerides264/HDL 67/LDL 129/alkaline phosphatase 131 with the upper limits of normal 115/ALT 41 with the upper limits of normal 29/ remainder ok  Restart meds Lipitor 40 mg and recheck in 3 months

## 2012-04-15 ENCOUNTER — Telehealth: Payer: Self-pay

## 2012-04-15 NOTE — Telephone Encounter (Signed)
PT WOULD LIKE TO LET DR.DOOLITTLE KNOW THAT SHE NOW HAS INSURANCE AND WILL NO LONGER BE TAKING THE LIPITOR. SHE WILL REFILL THE CRESTOR THAT WAS TOO EXPENSIVE WHEN SHE DID NOT HAVE INSURANCE.  BEST# [phone]

## 2012-04-20 ENCOUNTER — Encounter (INDEPENDENT_AMBULATORY_CARE_PROVIDER_SITE_OTHER): Payer: Self-pay | Admitting: General Surgery

## 2012-04-20 ENCOUNTER — Ambulatory Visit (INDEPENDENT_AMBULATORY_CARE_PROVIDER_SITE_OTHER): Payer: BC Managed Care – PPO | Admitting: General Surgery

## 2012-04-20 VITALS — BP 106/77 | HR 85 | Temp 97.8°F | Ht 66.0 in | Wt 225.6 lb

## 2012-04-20 DIAGNOSIS — T148XXA Other injury of unspecified body region, initial encounter: Secondary | ICD-10-CM

## 2012-04-23 ENCOUNTER — Telehealth (INDEPENDENT_AMBULATORY_CARE_PROVIDER_SITE_OTHER): Payer: Self-pay

## 2012-04-23 NOTE — Telephone Encounter (Signed)
Tammy at [employer] received encounter in work q to schedule aspiration for this pt. Per Tammy needs to be sent to [facility]. [facility] will not be able to do this until next week and it is for and abscess. Tammy advised I will send msg to assistant following Dr Marlou Starks with this info.

## 2012-04-24 ENCOUNTER — Encounter (INDEPENDENT_AMBULATORY_CARE_PROVIDER_SITE_OTHER): Payer: Self-pay | Admitting: General Surgery

## 2012-04-24 NOTE — Progress Notes (Signed)
Subjective:     Patient ID: Natalie Richard, female   DOB: 06/22/1966, 46 y.o.   MRN: [id number]  HPI The patient is a 46 year old white female who suffered a fall several months ago. She developed a hematoma on her right buttock area. This has been gradually improving. She returns today to she still notes some swelling at the area. Her pain has almost resolved. She is able to wear normal close again. She notices the area mostly when she sits for long periods of time.  Review of Systems  Constitutional: Negative.   HENT: Negative.   Eyes: Negative.   Respiratory: Negative.   Cardiovascular: Negative.   Gastrointestinal: Negative.   Genitourinary: Negative.   Musculoskeletal: Positive for back pain.  Skin: Negative.   Neurological: Negative.   Hematological: Negative.   Psychiatric/Behavioral: Negative.        Objective:   Physical Exam  Constitutional: She is oriented to person, place, and time. She appears well-developed and well-nourished.  HENT:  Head: Normocephalic and atraumatic.  Eyes: Conjunctivae and EOM are normal. Pupils are equal, round, and reactive to light.  Neck: Normal range of motion. Neck supple.  Cardiovascular: Normal rate, regular rhythm and normal heart sounds.   Pulmonary/Chest: Breath sounds normal.  Abdominal: Soft. Bowel sounds are normal.  Musculoskeletal: Normal range of motion.       She does have an area on her right buttock where there is a small amount of swelling compared to the opposite side. There is no redness or sign of infection. The bruising has pretty much resolved.  Neurological: She is alert and oriented to person, place, and time.  Skin: Skin is warm and dry.  Psychiatric: She has a normal mood and affect. Her behavior is normal.       Assessment:     At this point I think she has made significant improvement from the hematoma on her right buttock. In my opinion I would leave it alone. She would like to have the hematoma fluid  evacuated. Because of this we will send her to interventional radiology for an ultrasound. If there is any fluid then they may be able to aspirated under ultrasound guidance.    Plan:     We will plan to see her back on a when necessary basis

## 2012-04-27 ENCOUNTER — Ambulatory Visit (HOSPITAL_COMMUNITY): Admission: RE | Admit: 2012-04-27 | Payer: BC Managed Care – PPO | Source: Ambulatory Visit

## 2012-04-27 ENCOUNTER — Other Ambulatory Visit (INDEPENDENT_AMBULATORY_CARE_PROVIDER_SITE_OTHER): Payer: Self-pay | Admitting: General Surgery

## 2012-04-27 ENCOUNTER — Ambulatory Visit (HOSPITAL_COMMUNITY)
Admission: RE | Admit: 2012-04-27 | Discharge: 2012-04-27 | Disposition: A | Discharge status: Home / Self Care | Payer: BC Managed Care – PPO | Source: Ambulatory Visit | Attending: General Surgery | Admitting: General Surgery

## 2012-04-27 DIAGNOSIS — S300XXA Contusion of lower back and pelvis, initial encounter: Secondary | ICD-10-CM | POA: Insufficient documentation

## 2012-04-27 DIAGNOSIS — X58XXXA Exposure to other specified factors, initial encounter: Secondary | ICD-10-CM | POA: Insufficient documentation

## 2012-04-27 DIAGNOSIS — T148XXA Other injury of unspecified body region, initial encounter: Secondary | ICD-10-CM

## 2012-05-02 ENCOUNTER — Telehealth: Payer: Self-pay

## 2012-05-02 ENCOUNTER — Other Ambulatory Visit: Payer: Self-pay | Admitting: Internal Medicine

## 2012-05-02 DIAGNOSIS — F32A Depression, unspecified: Secondary | ICD-10-CM

## 2012-05-02 DIAGNOSIS — F329 Major depressive disorder, single episode, unspecified: Secondary | ICD-10-CM

## 2012-05-02 MED ORDER — BUPROPION HCL ER (XL) 300 MG PO TB24: 300.0000 mg | ORAL_TABLET | Freq: Every day | ORAL | Status: DC

## 2012-05-02 NOTE — Telephone Encounter (Signed)
Patient was advised.  

## 2012-05-02 NOTE — Telephone Encounter (Signed)
meds sent in for her with #90 quantity have called patient to have her call me back.

## 2012-05-02 NOTE — Telephone Encounter (Signed)
PT STATES THAT SHE HAS ALREADY CONTACTED HER PHARMACY REGARDING REFILLS ON HER WELBUTRIN, PT WOULD LIKE TO HAVE THIS REFILLED WITH 90 INSTEAD OF 30. BEST# [id number]

## 2012-05-03 ENCOUNTER — Other Ambulatory Visit: Payer: Self-pay | Admitting: Internal Medicine

## 2012-05-04 ENCOUNTER — Encounter: Payer: Self-pay | Admitting: Emergency Medicine

## 2012-05-06 ENCOUNTER — Other Ambulatory Visit: Payer: Self-pay | Admitting: Internal Medicine

## 2012-05-09 ENCOUNTER — Telehealth: Payer: Self-pay

## 2012-05-09 ENCOUNTER — Ambulatory Visit (INDEPENDENT_AMBULATORY_CARE_PROVIDER_SITE_OTHER): Payer: BC Managed Care – PPO | Admitting: Family Medicine

## 2012-05-09 VITALS — BP 116/88 | HR 93 | Temp 98.1°F | Resp 16 | Ht 65.58 in | Wt 223.4 lb

## 2012-05-09 DIAGNOSIS — J329 Chronic sinusitis, unspecified: Secondary | ICD-10-CM

## 2012-05-09 DIAGNOSIS — E119 Type 2 diabetes mellitus without complications: Secondary | ICD-10-CM

## 2012-05-09 LAB — POCT GLYCOSYLATED HEMOGLOBIN (HGB A1C): Hemoglobin A1C: 5.9

## 2012-05-09 MED ORDER — DOXYCYCLINE HYCLATE 100 MG PO TABS: 100.0000 mg | ORAL_TABLET | Freq: Two times a day (BID) | ORAL | Status: DC

## 2012-05-09 NOTE — Telephone Encounter (Signed)
Amox on 03/02/12 for 10 days, need more information.

## 2012-05-09 NOTE — Telephone Encounter (Signed)
Pt states she has another sinus infection and would like antibotics [facility] [address]  [phone]

## 2012-05-09 NOTE — Progress Notes (Signed)
[facility] [address] [place] [address] [phone]  Date:  05/09/2012   Name:  Natalie Richard   DOB:  11-16-1965   MRN:  [id number]  PCP:  Leandrew Koyanagi, MD    Chief Complaint: Nausea, Anxiety and Otalgia   History of Present Illness:  Natalie Richard is a 46 y.o. very pleasant female patient who presents with the following:  She has noted nausea for a couple of days, and left ear pain for about 5 days.  Taking a deep breath causes her to cough.  She has not noted a fever, but has felt clammy.  No ST.   She "feels real nervous inside" but is not sure why.   She also notes sinus pressure and pain.  Her "eyes feel heavy."   History of hysterectomy.    Dr. Laney Pastor saw her regarding her depression and anxiety in June.  At that time they increased her wellbutrin, continued celexa and changed her from xanax to klonopin.  She is not sure if these changes have helped her much.  She is trying to exercise and stay positive.   She is having to go to court regarding her recent "being let go" that came around the time of an injury which took her out of work. This is very stressful to her- she thinks that she will feel better once the trial is over.    Patient Active Problem List  Diagnosis  . HTN (hypertension), benign  . Osteopenia  . Migraines  . Insomnia  . Anxiety and depression  . Other and unspecified hyperlipidemia  . Diabetes mellitus  . Allergic rhinitis  . Sinusitis  . BMI 36.0-36.9,adult  . Hematoma    Past Medical History  Diagnosis Date  . Cancer     skin- Right shin squamous cell  . Diabetes mellitus   . GERD (gastroesophageal reflux disease)   . Hyperlipidemia   . Hypertension   . Hematoma     on buttock  . Migraine   . Seasonal allergies     Past Surgical History  Procedure Date  . Abdominal hysterectomy 2004    PCOS    History  Substance Use Topics  . Smoking status: Never Smoker   . Smokeless tobacco:  Never Used  . Alcohol Use: Yes     Socially    Family History  Problem Relation Age of Onset  . Cancer Mother     colon  . Cancer Father     bone and prostate  . Cancer Maternal Grandmother     breast  . Diabetes Mother   . Rheum arthritis Mother     Allergies  Allergen Reactions  . Augmentin (Amoxicillin-Pot Clavulanate) Nausea And Vomiting  . Ciprofloxacin     Developed itching with IV form in the hospital, but has since taken PO without a problem  . Metformin And Related Nausea And Vomiting  . Pyridium (Phenazopyridine Hcl)     Does not remember reaction  . Sulfa Antibiotics     As a younger person had itching, but has taken more recently and did ok  . Lorabid (Loracarbef) Rash    Medication list has been reviewed and updated.  Current Outpatient Prescriptions on File Prior to Visit  Medication Sig Dispense Refill  . buPROPion (WELLBUTRIN XL) 300 MG 24 hr tablet Take 1 tablet (300 mg total) by mouth daily.  90 tablet  0  . citalopram (CELEXA) 40 MG tablet Take  1 tablet (40 mg total) by mouth daily.  90 tablet  3  . clonazePAM (KLONOPIN) 1 MG tablet Take 1 mg by mouth 3 (three) times daily as needed.      . CRESTOR 40 MG tablet       . fluticasone (VERAMYST) 27.5 MCG/SPRAY nasal spray Place 2 sprays into the nose daily.      Marland Kitchen loratadine (CLARITIN) 10 MG tablet Take 10 mg by mouth daily.      . montelukast (SINGULAIR) 10 MG tablet TAKE 1 TABLET EVERY DAY  90 tablet  1  . pantoprazole (PROTONIX) 40 MG tablet TAKE 1 TABLET (40 MG TOTAL) BY MOUTH DAILY.  90 tablet  0  . SUMAtriptan (IMITREX) 100 MG tablet Take 100 mg by mouth every 2 (two) hours as needed.        Review of Systems:  As per HPI- otherwise negative.   Physical Examination: Filed Vitals:   05/09/12 1438  BP: 116/88  Pulse: 93  Temp: 98.1 F (36.7 C)  Resp: 16   Filed Vitals:   05/09/12 1438  Height: 5' 5.58" (1.666 m)  Weight: 223 lb 6.4 oz (101.334 kg)   Body mass index is 36.52  kg/(m^2). Ideal Body Weight: Weight in (lb) to have BMI = 25: 152.6   GEN: WDWN, NAD, Non-toxic, A & O x 3, obese HEENT: Atraumatic, Normocephalic. Neck supple. No masses, No LAD.  TM and IAC wnl bilaterally, normal oropharynx.  PEERL, EOMI Ears and Nose: No external deformity. CV: RRR, No M/G/R. No JVD. No thrill. No extra heart sounds. PULM: CTA B, no wheezes, crackles, rhonchi. No retractions. No resp. distress. No accessory muscle use. EXTR: No c/c/e NEURO Normal gait.  PSYCH: Normally interactive. Conversant. Not depressed or anxious appearing.  Calm demeanor.   Results for orders placed in visit on 05/09/12  POCT GLYCOSYLATED HEMOGLOBIN (HGB A1C)      Component Value Range   Hemoglobin A1C 5.9      Assessment and Plan: 1. Diabetes mellitus type II  POCT glycosylated hemoglobin (Hb A1C)  2. Sinusitis  doxycycline (VIBRA-TABS) 100 MG tablet   Suspect that Jatoria has a viral URI.  Did give her an rx for doxycycline to use if her cough and other symptoms continue, but encouraged her to try conservative therapy for a few days more prior to filling her rx.  Let me know if not better in a week or so- Sooner if worse.     Her A1c looks fine even without any oral glucose control medications in 2 months.  She is encouraged by this news.    We are sorry that she is feeling low and cannot see her counselor anymore- she used to see a counselor through her job.  Aleni does think that she will feel a lot better once her case is settled.  She plans to touch base with Dr. Laney Pastor regarding her mood treatment soon.    Lamar Blinks, MD

## 2012-05-10 NOTE — Telephone Encounter (Signed)
Pt came in office yesterday for OV.

## 2012-05-13 ENCOUNTER — Telehealth: Payer: Self-pay

## 2012-05-13 NOTE — Telephone Encounter (Signed)
PT CALLED STATING DOXYCYLINE IS MAKING HER ITCH. SPOKE TO HEATHER MARTE WHO SUGGESTED THAT, SINCE SYMPTOMS ARE GETTING BETTER, SHE SHOULD DISCONTINUE TAKING DOXYCYCLINE. IF SYMPTOMS WORSEN PT WILL NEED TO CALL IN FOR DIFFERENT RX. I PASSED MESSAGE ALONG TO PT WHO WILL COMPLY.  NO NEED TO CALL HER BACK.

## 2012-05-14 ENCOUNTER — Telehealth: Payer: Self-pay

## 2012-05-14 DIAGNOSIS — J329 Chronic sinusitis, unspecified: Secondary | ICD-10-CM

## 2012-05-14 MED ORDER — AZITHROMYCIN 250 MG PO TABS: ORAL_TABLET | ORAL | Status: AC

## 2012-05-14 NOTE — Telephone Encounter (Signed)
Phone message from yesterday indicates patient was having trouble with Doxy and wanted to d/c , was d/c now requesting something else patient has stated allergy to Cipro Sulfa and Augmentin.

## 2012-05-14 NOTE — Telephone Encounter (Signed)
Pt reports that she has no fever, but is still coughing up greenish mucus and also has nasal cong w/mucus that is sometimes green as well. Her ears (bilateral) are still feeling clogged intermittently. Pt stated she didn't start the Doxy right away, but took for about 2 1/2 days and saw some slight improvement on it, but then had to DC d/t itching. Dr Lorelei Pont, do you want to send in another Abx (see list of allergies to Abxs)? Noted reaction to Doxy in chart.

## 2012-05-14 NOTE — Telephone Encounter (Addendum)
Will use azithromycin as she has allergies to many other antibiotic classes.  Let her know that this could possibly cause palpitations in combination with her celexa- if this occurs stop the medication.

## 2012-05-14 NOTE — Telephone Encounter (Signed)
PT STATES SHE IS STILL HAVING HEAD AND CHEST CONGESTION AND WAS TOLD WE WOULD CALL HER SOMETHING ELSE IN. PLEASE CALL [phone]   [facility] [address]

## 2012-05-14 NOTE — Telephone Encounter (Signed)
What has patient been using OTC - what are her main symptoms, fever? Colored mucus? - I might try Mucinex (plain) 1281m bid for congestion if she has not been using this regularly.

## 2012-05-22 ENCOUNTER — Telehealth: Payer: Self-pay

## 2012-05-22 NOTE — Telephone Encounter (Signed)
LMOM to CB.  What med is patient needing?  Looks like Nexium, but we have patient taking Protonix.Marland KitchenMarland Kitchen

## 2012-05-22 NOTE — Telephone Encounter (Signed)
Pt is requesting mexium 90 day supply  [facility] [address]   cbn [phone]

## 2012-05-23 MED ORDER — ESOMEPRAZOLE MAGNESIUM 40 MG PO CPDR: 40.0000 mg | DELAYED_RELEASE_CAPSULE | Freq: Every day | ORAL | Status: DC

## 2012-05-23 MED ORDER — HYDROCHLOROTHIAZIDE 12.5 MG PO CAPS: 12.5000 mg | ORAL_CAPSULE | Freq: Every day | ORAL | Status: DC

## 2012-05-23 NOTE — Telephone Encounter (Signed)
Spoke with patient-- insurance has changed and she would like to be put back on Nexium, since it works better than Protonix.  Also needs refill on her HCTZ 12.5.  Will refill both x 90 days and advised patient to followup in October.

## 2012-05-24 ENCOUNTER — Telehealth: Payer: Self-pay

## 2012-05-24 MED ORDER — PANTOPRAZOLE SODIUM 40 MG PO TBEC: 40.0000 mg | DELAYED_RELEASE_TABLET | Freq: Every day | ORAL | Status: DC

## 2012-05-24 NOTE — Telephone Encounter (Signed)
PT STATES SHE WAS GIVEN NEXIUM AND THOUGHT MAYBE IT WOULD BE CHEAPER, HOWEVER IT STILL WAS OVER $100,00 SO NOW SHE WOULD LIKE TO GO BACK TO THE PROTONIX  SHE USE TO TAKE. PLEASE CALL [phone]     [facility]

## 2012-05-24 NOTE — Telephone Encounter (Signed)
I have called patient to advise and she needed #90 so quantity changed from 30 with refills to #90

## 2012-05-24 NOTE — Telephone Encounter (Signed)
Refill sent in

## 2012-05-28 ENCOUNTER — Telehealth: Payer: Self-pay

## 2012-05-28 MED ORDER — LORAZEPAM 1 MG PO TABS: 1.0000 mg | ORAL_TABLET | Freq: Three times a day (TID) | ORAL | Status: AC

## 2012-05-28 NOTE — Telephone Encounter (Signed)
We can try Ativan which i'll print and sign later today. BUT, she may get little response to meds at this point without resolving issues through counseling. She could access servives without much expense if any by goint to [facility] or [facility].

## 2012-05-28 NOTE — Telephone Encounter (Signed)
PT STATES DR DOOLITTLE WANTED HER TO TRY KLONOPIN AND SHE CAN'T TELL THAT MUCH DIFFERENCE. NEITHER IS HELPING PLEASE CALL [phone]  T

## 2012-05-28 NOTE — Telephone Encounter (Signed)
Left message for her to call me back. 

## 2012-05-28 NOTE — Telephone Encounter (Signed)
According to Dr Laney Pastor last note, he wanted to follow up with patient about this after she was on this med x3-4 weeks has been since June, she is advised she needs to discuss. I have spoken with her, and patient asks if Dr Laney Pastor can advise on this by phone since she is without medical insurance currently. I told her I will ask you, if you need to see her I will call her back to advise. The following is from the office visit on June 17th 2013  Problem #2 depression with anxiety worsened by situational stress  Referred to [facility]. 4 more specific counseling  Increase Wellbutrin to 300 XL daily/continue Celexa 40/change to Klonopin 1 mg 3 times a day #90 no refills/recheck in 3-4 weeks

## 2012-05-28 NOTE — Telephone Encounter (Signed)
I have advised her of this, she has looked into this but can not afford it. She is very appreciative of your help. Amy

## 2012-06-11 ENCOUNTER — Telehealth: Payer: Self-pay | Admitting: Radiology

## 2012-06-11 ENCOUNTER — Telehealth: Payer: Self-pay

## 2012-06-11 NOTE — Telephone Encounter (Signed)
The patient called with complaints of profuse sweating, chest pain and heart palpitations.  The patient stated that she did not think she was able to drive herself to [facility] or the hospital.  The patient was advised by this writer to seek emergency care by dialing 911 or going to the hospital as soon as possible.  This writer wanted to pass this information on to clinical staff as FYI.

## 2012-06-11 NOTE — Telephone Encounter (Signed)
Patient has called advising she is having chest pain and palpatations have tried calling her back at both numbers left message for her. I hope she has gone to Emergency room as directed.

## 2012-06-15 ENCOUNTER — Ambulatory Visit (INDEPENDENT_AMBULATORY_CARE_PROVIDER_SITE_OTHER): Payer: BC Managed Care – PPO | Admitting: Internal Medicine

## 2012-06-15 VITALS — BP 130/90 | HR 91 | Temp 98.1°F | Resp 18 | Ht 65.5 in | Wt 219.0 lb

## 2012-06-15 DIAGNOSIS — F411 Generalized anxiety disorder: Secondary | ICD-10-CM

## 2012-06-15 NOTE — Progress Notes (Signed)
  Subjective:    Patient ID: Natalie Richard, female    DOB: [birth year], 46 y.o.   MRN: [id number]  HPIjust d/ced after admit for chest pain/anxiety HPR Low K but heart OK tho stress echo set next wed Dr Jake Samples called in due to crying-changed wellbutr to buspar and got "loopy" Then itching so stopped it/started deplin 12m but too expensive so otc folic acid Trying to set up couns Restarted wellbutr Can't find job unempl out now Mom helping Husband thinks she is attention seeking /husband goes to bar every night Dr. FJake Samplesonly does inpatient psychiatry and that is not available to see her as an outpatient   Review of Systems     Objective:   Physical Exam Stable today       Assessment & Plan:  Problem #1 generalized anxiety disorder Problem #2 depression  #3Unstable marriage Problem #4 unemployed Multiple other medical problems  Restarted Wellbutrin Continue other medications [facility] Followup is planned

## 2012-06-17 ENCOUNTER — Telehealth: Payer: Self-pay

## 2012-06-17 NOTE — Telephone Encounter (Signed)
Back at 4 tomorrow Please call and troubleshoot symptoms

## 2012-06-17 NOTE — Telephone Encounter (Signed)
Back at 4 PM tomorrow please call / troubleshoot symptoms

## 2012-06-17 NOTE — Telephone Encounter (Signed)
PT IS REQUESTING DR. Laney Pastor TO CALL HER,  SHE WAS RELEASED FROM THE HOSPITAL AND IS STILL HAVING DIFFICULTY.   CALL [phone]

## 2012-06-18 NOTE — Telephone Encounter (Signed)
Spoke with patient, she states that the Buspar is still causing her to feel weird.  She took her last dose on Wednesday afternoon, and wonders how long it stays in your system?  Yesterday all day she felt like her "skin was melting"--but feels back to normal now and did not know how long to expect this?  Please advise.

## 2012-06-18 NOTE — Telephone Encounter (Signed)
BuSpar should be out of her system by today if her symptoms continue we should recheck

## 2012-06-19 NOTE — Telephone Encounter (Signed)
I have called her to advise she is feeling better today. If she gets worse or feelings return she should let me know.

## 2012-07-08 ENCOUNTER — Other Ambulatory Visit: Payer: Self-pay | Admitting: Internal Medicine

## 2012-07-08 ENCOUNTER — Other Ambulatory Visit: Payer: Self-pay | Admitting: Physician Assistant

## 2012-07-10 ENCOUNTER — Other Ambulatory Visit: Payer: Self-pay | Admitting: Internal Medicine

## 2012-07-11 ENCOUNTER — Telehealth: Payer: Self-pay | Admitting: Internal Medicine

## 2012-07-11 NOTE — Telephone Encounter (Signed)
At tl desk

## 2012-07-11 NOTE — Telephone Encounter (Signed)
Pt states that her pharmacy has not yet received her rx refill for ativan, pt would like to have this called in asap. Best# [phone]

## 2012-07-11 NOTE — Telephone Encounter (Signed)
lmom that rx was sent to pharmacy

## 2012-07-24 ENCOUNTER — Other Ambulatory Visit: Payer: Self-pay | Admitting: Internal Medicine

## 2012-07-24 NOTE — Telephone Encounter (Signed)
Please call patient.  What does she take the meloxicam for?

## 2012-07-25 ENCOUNTER — Other Ambulatory Visit: Payer: Self-pay | Admitting: Physician Assistant

## 2012-08-09 ENCOUNTER — Other Ambulatory Visit: Payer: Self-pay | Admitting: Physician Assistant

## 2012-08-10 ENCOUNTER — Other Ambulatory Visit: Payer: Self-pay | Admitting: Internal Medicine

## 2012-08-11 ENCOUNTER — Other Ambulatory Visit: Payer: Self-pay | Admitting: *Deleted

## 2012-08-15 NOTE — Telephone Encounter (Signed)
Patient takes the meloxicam for her headaches.

## 2012-08-23 ENCOUNTER — Other Ambulatory Visit: Payer: Self-pay | Admitting: Physician Assistant

## 2012-09-07 ENCOUNTER — Ambulatory Visit: Payer: BC Managed Care – PPO

## 2012-09-07 ENCOUNTER — Ambulatory Visit (INDEPENDENT_AMBULATORY_CARE_PROVIDER_SITE_OTHER): Payer: BC Managed Care – PPO | Admitting: Internal Medicine

## 2012-09-07 VITALS — BP 125/83 | HR 88 | Temp 98.5°F | Resp 17 | Ht 66.0 in | Wt 220.0 lb

## 2012-09-07 DIAGNOSIS — M545 Low back pain, unspecified: Secondary | ICD-10-CM

## 2012-09-07 DIAGNOSIS — M79605 Pain in left leg: Secondary | ICD-10-CM

## 2012-09-07 DIAGNOSIS — M79609 Pain in unspecified limb: Secondary | ICD-10-CM

## 2012-09-07 DIAGNOSIS — F411 Generalized anxiety disorder: Secondary | ICD-10-CM

## 2012-09-07 DIAGNOSIS — I10 Essential (primary) hypertension: Secondary | ICD-10-CM

## 2012-09-07 DIAGNOSIS — M4317 Spondylolisthesis, lumbosacral region: Secondary | ICD-10-CM

## 2012-09-07 MED ORDER — PREDNISONE 20 MG PO TABS: ORAL_TABLET | ORAL | Status: DC

## 2012-09-07 MED ORDER — CYCLOBENZAPRINE HCL 10 MG PO TABS: 10.0000 mg | ORAL_TABLET | Freq: Every day | ORAL | Status: DC

## 2012-09-07 MED ORDER — HYDROCODONE-ACETAMINOPHEN 10-325 MG PO TABS: 1.0000 | ORAL_TABLET | Freq: Every evening | ORAL | Status: DC | PRN

## 2012-09-07 MED ORDER — LORAZEPAM 1 MG PO TABS: 1.0000 mg | ORAL_TABLET | Freq: Three times a day (TID) | ORAL | Status: DC | PRN

## 2012-09-07 MED ORDER — HYDROCHLOROTHIAZIDE 12.5 MG PO CAPS: 12.5000 mg | ORAL_CAPSULE | Freq: Every day | ORAL | Status: DC

## 2012-09-07 NOTE — Progress Notes (Deleted)
  Subjective:    Patient ID: Natalie Richard, female    DOB: 1965-11-24, 46 y.o.   MRN: [id number]  Back Pain    46 yr old female c/o recurrent for years, left lower back pain x 3 days, burning, numbness in left outer thigh area, pain radiating to left buttocks and left leg.  Pt is on her feet all day at her current position. Pain is worse after work.  Review of Systems  Musculoskeletal: Positive for back pain.       Objective:   Physical Exam        Assessment & Plan:

## 2012-09-08 NOTE — Progress Notes (Signed)
  Subjective:    Patient ID: Natalie Richard, female    DOB: 04-18-1966, 46 y.o.   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 Has history of back pain for the last 6-8 months with a wax wane pattern but never radicular symptoms/has responded to anti-inflammatory medications and muscle relaxers very easily in the past/no history of prior back injury Denies genitourinary symptoms/no stool incontinence Could not sleep last night due to pain  Recent medical evaluation in the last 6 months by psychiatry and cardiology/no cardiac problems/psychology problems stabilized-dr farah (on Gabapentin)  Also needs refill medication for hypertension and anxiety  Review of Systems No fever chills or night sweats No new fatigue No other symptoms of infection No constipation or diarrhea No sensory complaints in the lower extremities    Objective:   Physical Exam Vital signs stable except overweight Tender over the lumbosacral area bilaterally to palpation Tender over the left sacroiliac area Straight leg raise negative at 90 bilaterally No peripheral sensory losses DTRs symmetrical and 2+ in the lower extremities No clonus Pain worse with forward bend or twist       [facility] reading (PRIMARY) by  Dr. Doolittle=Normal LS spine   Assessment & Plan:   1. Lower back pain    2. Left leg pain    3. GAD (generalized anxiety disorder)    4. HTN (hypertension)       Low back pain booklet Meds ordered this encounter  Medications  . hydrochlorothiazide (MICROZIDE) 12.5 MG capsule    Sig: Take 1 capsule (12.5 mg total) by mouth daily.    Dispense:  90 capsule    Refill:  1  . LORazepam (ATIVAN) 1 MG tablet    Sig: Take 1 tablet (1 mg total) by mouth every 8 (eight) hours as needed for anxiety.    Dispense:  90 tablet    Refill:   2  . cyclobenzaprine (FLEXERIL) 10 MG tablet    Sig: Take 1 tablet (10 mg total) by mouth at bedtime.    Dispense:  30 tablet    Refill:  0  . predniSONE (DELTASONE) 20 MG tablet    Sig: 3/3/2/2/1/1 single daily dose for 6 days    Dispense:  12 tablet    Refill:  0  . HYDROcodone-acetaminophen (NORCO) 10-325 MG per tablet    Sig: Take 1 tablet by mouth at bedtime as needed for pain.    Dispense:  14 tablet    Refill:  0   Discussed possible need for physical therapy Followup 2-4 weeks if not well

## 2012-09-20 ENCOUNTER — Telehealth: Payer: Self-pay

## 2012-09-20 NOTE — Telephone Encounter (Signed)
Pt is requesting a copy of her x-rays faxed to Dr. Rhona Raider office [facility]. Any question please contact patient at [phone].

## 2012-09-20 NOTE — Telephone Encounter (Signed)
Left message for her to call me back about this.

## 2012-09-20 NOTE — Telephone Encounter (Signed)
xrays can not be faxed. She can pick up disk of them. Or Dr Rhona Raider can pull them up on computer, he has access to [facility] system.

## 2012-09-20 NOTE — Telephone Encounter (Signed)
Spoke with pt advised to pick up disc.

## 2012-09-20 NOTE — Progress Notes (Unsigned)
Set up referral to [facility]

## 2012-09-21 ENCOUNTER — Telehealth: Payer: Self-pay

## 2012-09-21 DIAGNOSIS — M549 Dorsalgia, unspecified: Secondary | ICD-10-CM

## 2012-09-21 MED ORDER — HYDROCODONE-ACETAMINOPHEN 10-325 MG PO TABS: 1.0000 | ORAL_TABLET | Freq: Three times a day (TID) | ORAL | Status: DC | PRN

## 2012-09-21 NOTE — Telephone Encounter (Signed)
Pt has an appt with Dr Rhona Raider but is requesting a rx refill on meds to get her thru till she can see Dr Latanya Maudlin, she states the aleve and ibuprofen its not helping with the pain, [phone] her cell [phone]

## 2012-09-21 NOTE — Telephone Encounter (Signed)
No orders of the defined types were placed in this encounter.   Meds ordered this encounter  Medications  . HYDROcodone-acetaminophen (NORCO) 10-325 MG per tablet    Sig: Take 1 tablet by mouth every 8 (eight) hours as needed for pain.    Dispense:  20 tablet    Refill:  0

## 2012-09-22 NOTE — Telephone Encounter (Signed)
Pt notified that rx was sent in 

## 2012-09-22 NOTE — Telephone Encounter (Signed)
Pt notified that rx is ready for pickup

## 2012-10-09 ENCOUNTER — Other Ambulatory Visit: Payer: Self-pay | Admitting: Internal Medicine

## 2012-10-14 ENCOUNTER — Other Ambulatory Visit: Payer: Self-pay | Admitting: Internal Medicine

## 2012-10-17 ENCOUNTER — Ambulatory Visit (INDEPENDENT_AMBULATORY_CARE_PROVIDER_SITE_OTHER): Payer: BC Managed Care – PPO | Admitting: Family Medicine

## 2012-10-17 VITALS — BP 122/86 | HR 80 | Temp 98.6°F | Resp 18 | Wt 220.0 lb

## 2012-10-17 DIAGNOSIS — J320 Chronic maxillary sinusitis: Secondary | ICD-10-CM

## 2012-10-17 DIAGNOSIS — R51 Headache: Secondary | ICD-10-CM

## 2012-10-17 DIAGNOSIS — S0003XA Contusion of scalp, initial encounter: Secondary | ICD-10-CM

## 2012-10-17 DIAGNOSIS — S0990XA Unspecified injury of head, initial encounter: Secondary | ICD-10-CM

## 2012-10-17 DIAGNOSIS — S1093XA Contusion of unspecified part of neck, initial encounter: Secondary | ICD-10-CM

## 2012-10-17 MED ORDER — AMOXICILLIN 500 MG PO CAPS: 1000.0000 mg | ORAL_CAPSULE | Freq: Three times a day (TID) | ORAL | Status: DC

## 2012-10-17 NOTE — Progress Notes (Signed)
Subjective:    Patient ID: Natalie Richard, female    DOB: [birth year], 47 y.o.   MRN: [id number]  HPI Natalie Richard is a 47 y.o. female 2 concerns today.   Thick mucus in back of throat - in the morning past 1 week. Discolored nasal discharge past week as well. No fever. Slight nausea, no vomiting - past week with drainage. Tx: saline nasal washes 1-2 times per day. mucinex D for sinus congestion for past 3-4 days.  Has hx of recurrent sinusitis in past  - followed by ENT prior, but not seen this episode.  Last treated with antibiotic about 4 months ago. Has taken Biaxin or amoxicillin in past.  Didn't tolerate augementin due to gi side effects.   Working part time at [employer].  Dusting shelf yesterday at about 3pm.  Banged L forehead on glass shelf. Did not break glass. No LOC, no wound or bleeding. No prior eval. Slight soreness in area of injury.  Slight headache, no difference in nausea. Minimal dizziness- but had with sinus sx's before head injury. Constant headache today, but no worsening. Not as bad as migraine, and no aura.  Feels like eyes heavy".  No blood thinners.    Review of Systems  Constitutional: Negative for fever and chills.  HENT: Positive for postnasal drip and sinus pressure.   Respiratory: Positive for cough (min with drainage. ).   Neurological: Positive for dizziness and headaches. Negative for seizures, syncope, facial asymmetry, speech difficulty and weakness.       Objective:   Physical Exam  Vitals reviewed. Constitutional: She is oriented to person, place, and time. She appears well-developed and well-nourished. No distress.  HENT:  Head: Normocephalic and atraumatic. Head is without raccoon's eyes and without Battle's sign.    Right Ear: Hearing, tympanic membrane, external ear and ear canal normal. No hemotympanum.  Left Ear: Hearing, tympanic membrane, external ear and ear canal normal. No hemotympanum.  Nose: Mucosal edema present. No  rhinorrhea, nasal deformity or nasal septal hematoma. Right sinus exhibits maxillary sinus tenderness. Left sinus exhibits maxillary sinus tenderness (minimal bilateral ttp. ).  Mouth/Throat: Oropharynx is clear and moist. No oropharyngeal exudate.  Eyes: Conjunctivae normal and EOM are normal. Pupils are equal, round, and reactive to light. Right eye exhibits no nystagmus. Left eye exhibits no nystagmus.  Cardiovascular: Normal rate, regular rhythm, normal heart sounds and intact distal pulses.   No murmur heard. Pulmonary/Chest: Effort normal and breath sounds normal. No respiratory distress. She has no wheezes. She has no rhonchi.  Neurological: She is alert and oriented to person, place, and time. She has normal strength. No sensory deficit. Coordination and gait normal.  Reflex Scores:      Tricep reflexes are 2+ on the right side and 2+ on the left side.      Bicep reflexes are 2+ on the right side and 2+ on the left side.      Brachioradialis reflexes are 2+ on the right side and 2+ on the left side.      Patellar reflexes are 2+ on the right side and 2+ on the left side.      Achilles reflexes are 2+ on the right side and 2+ on the left side.      No pronator drift, normal heel to toe. No nystagmus.   Skin: Skin is warm and dry. No rash noted.  Psychiatric: She has a normal mood and affect. Her behavior is normal.  Assessment & Plan:  Natalie Richard is a 47 y.o. female 1. Maxillary sinusitis  amoxicillin (AMOXIL) 500 MG capsule - 1 gram tid for 10 days.  Hx of recurrent sinusitis.   2. Head injury   contusion.  Doubt concussion, but head injury warning sings discussed.   Note for work if needed for tomorrow, but ok to work if not any worsening of symptoms.  If worse - rtc or ER.  3. Headache    4. Contusion of scalp  Ice, sx care. Recheck in 5 days. Sooner if worse.     Patient Instructions  Start amoxicillin, increase frequency of saline washes.  You may want to change  mucinex to the blue container (without a decongestant as this can thicken the secretions).  Ok to take tylenol, apply ice to sore area as needed. If any worsening of headache - be evaluated here or the emergency room - see below. Follow up for recheck in the next 5 days.  Return to the clinic or go to the nearest emergency room if any of your symptoms worsen or new symptoms occur.  HEAD INJURY If any of the following occur notify your physician or go to the Hospital Emergency Department: . Increased drowsiness, stupor or loss of consciousness . Restlessness or convulsions (fits) . Paralysis in arms or legs . Temperature above 100 F . Vomiting . Severe headache . Blood or clear fluid dripping from the nose or ears . Stiffness of the neck . Dizziness or blurred vision . Pulsating pain in the eye . Unequal pupils of eye . Personality changes . Any other unusual symptoms PRECAUTIONS . Keep head elevated at all times for the first 24 hours (Elevate mattress if pillow is ineffective) . Do not take tranquilizers, sedatives, narcotics or alcohol . Avoid aspirin. Use only acetaminophen (e.g. Tylenol) or ibuprofen (e.g. Advil) for relief of pain. Follow directions on the bottle for dosage. . Use ice packs for comfort  MEDICATIONS Use medications only as directed by your physician

## 2012-10-17 NOTE — Patient Instructions (Signed)
Start amoxicillin, increase frequency of saline washes.  You may want to change mucinex to the blue container (without a decongestant as this can thicken the secretions).  Ok to take tylenol, apply ice to sore area as needed. If any worsening of headache - be evaluated here or the emergency room - see below. Follow up for recheck in the next 5 days.  Return to the clinic or go to the nearest emergency room if any of your symptoms worsen or new symptoms occur.  HEAD INJURY If any of the following occur notify your physician or go to the Hospital Emergency Department: . Increased drowsiness, stupor or loss of consciousness . Restlessness or convulsions (fits) . Paralysis in arms or legs . Temperature above 100 F . Vomiting . Severe headache . Blood or clear fluid dripping from the nose or ears . Stiffness of the neck . Dizziness or blurred vision . Pulsating pain in the eye . Unequal pupils of eye . Personality changes . Any other unusual symptoms PRECAUTIONS . Keep head elevated at all times for the first 24 hours (Elevate mattress if pillow is ineffective) . Do not take tranquilizers, sedatives, narcotics or alcohol . Avoid aspirin. Use only acetaminophen (e.g. Tylenol) or ibuprofen (e.g. Advil) for relief of pain. Follow directions on the bottle for dosage. . Use ice packs for comfort  MEDICATIONS Use medications only as directed by your physician

## 2012-10-26 ENCOUNTER — Other Ambulatory Visit: Payer: Self-pay | Admitting: Physician Assistant

## 2012-12-12 ENCOUNTER — Telehealth: Payer: Self-pay

## 2012-12-12 MED ORDER — ROSUVASTATIN CALCIUM 40 MG PO TABS: 40.0000 mg | ORAL_TABLET | Freq: Every day | ORAL | Status: DC

## 2012-12-12 NOTE — Telephone Encounter (Signed)
[facility] 40 mg, #90. Please advise

## 2012-12-12 NOTE — Telephone Encounter (Signed)
Rx sent to pharmacy. Needs OV and labs

## 2012-12-14 ENCOUNTER — Ambulatory Visit (INDEPENDENT_AMBULATORY_CARE_PROVIDER_SITE_OTHER): Payer: BC Managed Care – PPO | Admitting: Internal Medicine

## 2012-12-14 ENCOUNTER — Other Ambulatory Visit: Payer: Self-pay | Admitting: Internal Medicine

## 2012-12-14 VITALS — BP 132/84 | HR 80 | Temp 98.4°F | Resp 16 | Ht 66.5 in | Wt 216.0 lb

## 2012-12-14 DIAGNOSIS — F411 Generalized anxiety disorder: Secondary | ICD-10-CM

## 2012-12-14 DIAGNOSIS — I10 Essential (primary) hypertension: Secondary | ICD-10-CM

## 2012-12-14 DIAGNOSIS — E119 Type 2 diabetes mellitus without complications: Secondary | ICD-10-CM

## 2012-12-14 DIAGNOSIS — F341 Dysthymic disorder: Secondary | ICD-10-CM

## 2012-12-14 DIAGNOSIS — E785 Hyperlipidemia, unspecified: Secondary | ICD-10-CM

## 2012-12-14 DIAGNOSIS — R5381 Other malaise: Secondary | ICD-10-CM

## 2012-12-14 DIAGNOSIS — J301 Allergic rhinitis due to pollen: Secondary | ICD-10-CM

## 2012-12-14 LAB — COMPREHENSIVE METABOLIC PANEL
Alkaline Phosphatase: 148 U/L — ABNORMAL HIGH (ref 39–117)
BUN: 12 mg/dL (ref 6–23)
CO2: 25 mEq/L (ref 19–32)
Creat: 0.76 mg/dL (ref 0.50–1.10)
Glucose, Bld: 107 mg/dL — ABNORMAL HIGH (ref 70–99)
Sodium: 137 mEq/L (ref 135–145)
Total Bilirubin: 0.3 mg/dL (ref 0.3–1.2)

## 2012-12-14 LAB — POCT CBC
Lymph, poc: 2 (ref 0.6–3.4)
MCH, POC: 27 pg (ref 27–31.2)
MCHC: 32.2 g/dL (ref 31.8–35.4)
MCV: 84.1 fL (ref 80–97)
MID (cbc): 0.4 (ref 0–0.9)
MPV: 7.9 fL (ref 0–99.8)
POC LYMPH PERCENT: 29.5 %L (ref 10–50)
POC MID %: 6.3 %M (ref 0–12)
Platelet Count, POC: 461 10*3/uL — AB (ref 142–424)
RBC: 5.14 M/uL (ref 4.04–5.48)
WBC: 6.9 10*3/uL (ref 4.6–10.2)

## 2012-12-14 LAB — LIPID PANEL
HDL: 58 mg/dL (ref >39)
Total CHOL/HDL Ratio: 3 Ratio
Triglycerides: 158 mg/dL — ABNORMAL HIGH (ref <150)

## 2012-12-14 LAB — TSH: TSH: 1.217 u[IU]/mL (ref 0.350–4.500)

## 2012-12-14 MED ORDER — CITALOPRAM HYDROBROMIDE 40 MG PO TABS: 40.0000 mg | ORAL_TABLET | Freq: Every day | ORAL | Status: DC

## 2012-12-14 MED ORDER — LORAZEPAM 1 MG PO TABS: 1.0000 mg | ORAL_TABLET | Freq: Three times a day (TID) | ORAL | Status: DC | PRN

## 2012-12-14 MED ORDER — HYDROCHLOROTHIAZIDE 12.5 MG PO CAPS: 12.5000 mg | ORAL_CAPSULE | Freq: Every day | ORAL | Status: DC

## 2012-12-14 MED ORDER — ROSUVASTATIN CALCIUM 40 MG PO TABS: 40.0000 mg | ORAL_TABLET | Freq: Every day | ORAL | Status: DC

## 2012-12-14 NOTE — Progress Notes (Signed)
Subjective:    Patient ID: Natalie Richard, female    DOB: 07/07/1966, 47 y.o.   MRN: [id number]  HPI followup for medications and problems Patient Active Problem List  Diagnosis  . HTN (hypertension), benign  . Osteopenia  . Migraines  . Insomnia  . Anxiety and depression  . Other and unspecified hyperlipidemia  . Diabetes mellitus  . Allergic rhinitis  . Sinusitis  . BMI 36.0-36.9,adult  . Hematoma   Still out of work/mother desperately ill - in the hospital 4 times in the last 3 months Has gained some weight Dr. Erling Cruz started Neurontin-notes have not been scanned Off Wellbutrin/anxiety depression and insomnia stable on Celexa and Ativan which is needed 2-3 times a day   Review of Systems  Constitutional: Negative for activity change, appetite change, fatigue and unexpected weight change.  HENT: Negative for neck pain.        Allergies controlled by over-the-counter loratadine  Respiratory: Negative for cough, shortness of breath and wheezing.   Cardiovascular: Negative for chest pain, palpitations and leg swelling.  Gastrointestinal:       Reflux controlled by DEXAlant-Dr. Collene Mares Recent colonoscopy and? Endoscopy     Endocrine: Negative for cold intolerance, polydipsia and polyuria.  Genitourinary: Negative for difficulty urinating.  Musculoskeletal: Negative for myalgias, back pain and joint swelling.       Off meloxicam and Flexeril  Skin: Negative for rash.  Allergic/Immunologic: Positive for environmental allergies.  Neurological: Negative for light-headedness and headaches.       Objective:   Physical Exam BP 132/84  Pulse 80  Temp(Src) 98.4 F (36.9 C) (Oral)  Resp 16  Ht 5' 6.5" (1.689 m)  Wt 216 lb (97.977 kg)  BMI 34.35 kg/m2  SpO2 97% HEENT clear except for allergic nares Heart regular Lungs clear No edema Good peripheral pulses Neurological intact Psychological stable   Results for orders placed in visit on 12/14/12  POCT CBC   Result Value Range   WBC 6.9  4.6 - 10.2 K/uL   Lymph, poc 2.0  0.6 - 3.4   POC LYMPH PERCENT 29.5  10 - 50 %L   MID (cbc) 0.4  0 - 0.9   POC MID % 6.3  0 - 12 %M   POC Granulocyte 4.4  2 - 6.9   Granulocyte percent 64.2  37 - 80 %G   RBC 5.14  4.04 - 5.48 M/uL   Hemoglobin 13.9  12.2 - 16.2 g/dL   HCT, POC 43.2  37.7 - 47.9 %   MCV 84.1  80 - 97 fL   MCH, POC 27.0  27 - 31.2 pg   MCHC 32.2  31.8 - 35.4 g/dL   RDW, POC 15.8     Platelet Count, POC 461 (*) 142 - 424 K/uL   MPV 7.9  0 - 99.8 fL  POCT GLYCOSYLATED HEMOGLOBIN (HGB A1C)      Result Value Range   Hemoglobin A1C 5.9         Assessment & Plan:  Other and unspecified hyperlipidemia  Unspecified essential hypertension Anxiety state, unspecified  Allergic rhinitis due to pollen Other malaise and fatigue - Plan: Comprehensive metabolic panel Hyperglycemia/Borderline DM in past Type II or unspecified type diabetes mellitus without mention of complication Dysthymic disorder   Meds ordered this encounter  Medications  . citalopram (CELEXA) 40 MG tablet    Sig: Take 1 tablet (40 mg total) by mouth daily.    Dispense:  90 tablet  Refill:  3  . hydrochlorothiazide (MICROZIDE) 12.5 MG capsule    Sig: Take 1 capsule (12.5 mg total) by mouth daily.    Dispense:  90 capsule    Refill:  1  . LORazepam (ATIVAN) 1 MG tablet    Sig: Take 1 tablet (1 mg total) by mouth every 8 (eight) hours as needed for anxiety.    Dispense:  90 tablet    Refill:  5  . rosuvastatin (CRESTOR) 40 MG tablet    Sig: Take 1 tablet (40 mg total) by mouth daily. Needs office visit/labs    Dispense:  30 tablet    Refill:  0    Order Specific Question:  Supervising Provider    Answer:  DOOLITTLE, ROBERT P [[id number]]   11mo

## 2012-12-19 ENCOUNTER — Encounter: Payer: Self-pay | Admitting: Internal Medicine

## 2012-12-21 ENCOUNTER — Other Ambulatory Visit: Payer: Self-pay | Admitting: *Deleted

## 2012-12-21 ENCOUNTER — Telehealth: Payer: Self-pay

## 2012-12-21 DIAGNOSIS — R748 Abnormal levels of other serum enzymes: Secondary | ICD-10-CM

## 2012-12-21 DIAGNOSIS — R7989 Other specified abnormal findings of blood chemistry: Secondary | ICD-10-CM

## 2012-12-21 NOTE — Telephone Encounter (Signed)
Pt would like referral for an ultrasound [phone]

## 2012-12-21 NOTE — Telephone Encounter (Signed)
Referral was made for her and advised her she will get a call about this next week she wants call back about this on her cell

## 2012-12-27 ENCOUNTER — Ambulatory Visit
Admission: RE | Admit: 2012-12-27 | Discharge: 2012-12-27 | Disposition: A | Discharge status: Home / Self Care | Payer: BC Managed Care – PPO | Source: Ambulatory Visit | Attending: Internal Medicine | Admitting: Internal Medicine

## 2012-12-27 DIAGNOSIS — R7989 Other specified abnormal findings of blood chemistry: Secondary | ICD-10-CM

## 2012-12-27 DIAGNOSIS — R748 Abnormal levels of other serum enzymes: Secondary | ICD-10-CM

## 2012-12-28 ENCOUNTER — Telehealth: Payer: Self-pay | Admitting: Internal Medicine

## 2012-12-28 NOTE — Telephone Encounter (Signed)
Thanks I have called patient to advise.

## 2012-12-28 NOTE — Telephone Encounter (Signed)
Ultrasound shows fatty infiltration of the liver which is probably causing the abnormal lab tests/no evidence of gallbladder disease at this point Continued efforts at weight loss and we will recheck these labs in 3 months

## 2013-01-03 ENCOUNTER — Encounter: Payer: Self-pay | Admitting: Internal Medicine

## 2013-03-06 ENCOUNTER — Telehealth: Payer: Self-pay

## 2013-03-06 NOTE — Telephone Encounter (Signed)
Pt is needing to get her refill on her migraine medication   Best number [phone]

## 2013-03-07 NOTE — Telephone Encounter (Signed)
What medication does she take for her migranes? Left message for her to call me back.

## 2013-03-07 NOTE — Telephone Encounter (Signed)
Generic imitrex. Pended, however this is listed as historical, do you want to provide this for her?

## 2013-03-11 MED ORDER — SUMATRIPTAN SUCCINATE 100 MG PO TABS: 100.0000 mg | ORAL_TABLET | ORAL | Status: DC | PRN

## 2013-03-11 NOTE — Telephone Encounter (Signed)
Meds ordered this encounter  Medications  . SUMAtriptan (IMITREX) 100 MG tablet    Sig: Take 1 tablet (100 mg total) by mouth every 2 (two) hours as needed for migraine.    Dispense:  10 tablet    Refill:  0

## 2013-03-18 ENCOUNTER — Telehealth: Payer: Self-pay

## 2013-03-18 DIAGNOSIS — F411 Generalized anxiety disorder: Secondary | ICD-10-CM

## 2013-03-18 NOTE — Telephone Encounter (Signed)
Pt is needing to talk with dr Laney Pastor to get a referral to [facility] phone [phone] and fax [phone]  Best number [phone]

## 2013-03-19 NOTE — Telephone Encounter (Signed)
Get this info for me or she could use my chart

## 2013-03-20 NOTE — Telephone Encounter (Signed)
Called patient did not get answer. What is this referral for? She is not on mychart yet.

## 2013-03-21 NOTE — Telephone Encounter (Signed)
Spoke to her it is for [employer] [facility], [facility], referral made.

## 2013-04-24 ENCOUNTER — Telehealth: Payer: Self-pay

## 2013-04-24 NOTE — Telephone Encounter (Signed)
Pt wants to know if Natalie Richard will call her in an antibiotic for a sinus infection.  I explained to her that she would need to come in for that, but she states that he has done this for her before because he knows her history.  Says she just had back injections and that she cant sit at the walk in for hours.  Please call  [phone]

## 2013-04-25 NOTE — Telephone Encounter (Signed)
Rtn patient call- Advised she will need an office visit. Pt has an appt with her ENT next week and she will have this taken care of.

## 2013-05-08 ENCOUNTER — Telehealth: Payer: Self-pay

## 2013-05-08 MED ORDER — ROSUVASTATIN CALCIUM 40 MG PO TABS: ORAL_TABLET | ORAL | Status: DC

## 2013-05-08 NOTE — Telephone Encounter (Signed)
Pt states that she needs a 90 day supply of crestor. Please advise. Best#[phone]

## 2013-05-21 ENCOUNTER — Telehealth: Payer: Self-pay

## 2013-05-21 ENCOUNTER — Ambulatory Visit: Payer: BC Managed Care – PPO

## 2013-05-21 ENCOUNTER — Ambulatory Visit (INDEPENDENT_AMBULATORY_CARE_PROVIDER_SITE_OTHER): Payer: BC Managed Care – PPO | Admitting: Family Medicine

## 2013-05-21 VITALS — BP 110/80 | HR 83 | Temp 98.4°F | Resp 16 | Ht 66.0 in | Wt 218.6 lb

## 2013-05-21 DIAGNOSIS — Z01810 Encounter for preprocedural cardiovascular examination: Secondary | ICD-10-CM

## 2013-05-21 DIAGNOSIS — E7800 Pure hypercholesterolemia, unspecified: Secondary | ICD-10-CM

## 2013-05-21 DIAGNOSIS — IMO0001 Reserved for inherently not codable concepts without codable children: Secondary | ICD-10-CM

## 2013-05-21 LAB — POCT CBC
Lymph, poc: 1.4 (ref 0.6–3.4)
MCH, POC: 27.5 pg (ref 27–31.2)
MCHC: 32.9 g/dL (ref 31.8–35.4)
MCV: 83.5 fL (ref 80–97)
MID (cbc): 0.5 (ref 0–0.9)
MPV: 7.9 fL (ref 0–99.8)
POC LYMPH PERCENT: 15.5 %L (ref 10–50)
POC MID %: 5.4 %M (ref 0–12)
Platelet Count, POC: 450 10*3/uL — AB (ref 142–424)
RBC: 5.16 M/uL (ref 4.04–5.48)
RDW, POC: 15.8 %
WBC: 9.1 10*3/uL (ref 4.6–10.2)

## 2013-05-21 LAB — LIPID PANEL: HDL: 61 mg/dL (ref >39)

## 2013-05-21 LAB — COMPREHENSIVE METABOLIC PANEL
ALT: 45 U/L — ABNORMAL HIGH (ref 0–35)
BUN: 12 mg/dL (ref 6–23)
CO2: 23 mEq/L (ref 19–32)
Calcium: 9.6 mg/dL (ref 8.4–10.5)
Chloride: 104 mEq/L (ref 96–112)
Creat: 0.73 mg/dL (ref 0.50–1.10)

## 2013-05-21 LAB — POCT GLYCOSYLATED HEMOGLOBIN (HGB A1C): Hemoglobin A1C: 6.3

## 2013-05-21 LAB — TSH: TSH: 0.746 u[IU]/mL (ref 0.350–4.500)

## 2013-05-21 MED ORDER — ROSUVASTATIN CALCIUM 40 MG PO TABS: ORAL_TABLET | ORAL | Status: DC

## 2013-05-21 NOTE — Telephone Encounter (Signed)
PT WAS JUST SEEN TODAY AND FORGOT TO GET A WORK NOTE FOR TODAY. PLEASE FAX TO [phone] ATTN: BRITTA AND YOU MAY REACH PT AT [phone] IF NEEDED

## 2013-05-21 NOTE — Patient Instructions (Addendum)
I will send a letter to your ENT doctor today or tomorrow, depending on when your labs come in.  Your A1c is acceptable, but we do want you to work on diet and exercise  I will be in touch with the rest of your labs.

## 2013-05-21 NOTE — Progress Notes (Signed)
[facility] [address], [place] [address] [phone]  Date:  05/21/2013   Name:  Natalie Richard   DOB:  08/31/1966   MRN:  [id number]  PCP:  Leandrew Koyanagi, MD    Chief Complaint: Labs, EKG for surgery 05/24/13 and Medication Refill   History of Present Illness:  Natalie Richard is a 47 y.o. very pleasant female patient who presents with the following:  Here today for pre- op clearance.  She does have a history of DM, HTN, obesity.   She is going to have sinus surgery for chronic sinusitis.   This is scheduled for Friday.  Her ENT is with [facility] in [place]-   William A. Meredith Leeds, MD  She needs her routine labs today, and an EKG. She also needs a refill of her crestor. Admits she has been stretching out her crestor and not taking it every day lately.  She is fasting today.   Her diabetes is diet controlled, no current meds.   She has felt tired lately.  Otherwise she has felt well.  No CP, no SOB.  She is able to climb stairs, do housework, have intercourse.    She did have a treadmill test about one year ago when she was having CP due to anxiety.  This was done at [facility] in [facility], per Dr. Quillian Quince.  It looked ok per her recollection.    Patient Active Problem List   Diagnosis Date Noted  . BMI 36.0-36.9,adult 03/19/2012  . Hematoma 03/19/2012  . Sinusitis 03/02/2012  . HTN (hypertension), benign 01/09/2012  . Osteopenia 01/09/2012  . Migraines 01/09/2012  . Insomnia 01/09/2012  . Anxiety and depression 01/09/2012  . Other and unspecified hyperlipidemia 01/09/2012  . Diabetes mellitus 01/09/2012  . Allergic rhinitis 01/09/2012    Past Medical History  Diagnosis Date  . Cancer     skin- Right shin squamous cell  . Diabetes mellitus   . GERD (gastroesophageal reflux disease)   . Hyperlipidemia   . Hypertension   . Hematoma     on buttock  . Migraine   . Seasonal allergies   . Depression   . Anxiety     Past  Surgical History  Procedure Laterality Date  . Abdominal hysterectomy  2004    PCOS  . Eye surgery      History  Substance Use Topics  . Smoking status: Never Smoker   . Smokeless tobacco: Never Used  . Alcohol Use: No     Comment: Socially    Family History  Problem Relation Age of Onset  . Cancer Mother     colon  . Diabetes Mother   . Rheum arthritis Mother   . Cancer Father     bone and prostate  . Cancer Maternal Grandmother     breast  . Diabetes Maternal Grandfather     Allergies  Allergen Reactions  . Buspar [Buspirone] Other (See Comments)    Lip swelling and rash numbness   . Augmentin [Amoxicillin-Pot Clavulanate] Nausea And Vomiting  . Ciprofloxacin     Developed itching with IV form in the hospital, but has since taken PO without a problem  . Doxycycline Itching  . Metformin And Related Nausea And Vomiting  . Pyridium [Phenazopyridine Hcl]     Does not remember reaction  . Sulfa Antibiotics     As a younger person had itching, but has taken more recently and did ok  . Lorabid [  Loracarbef] Rash    Medication list has been reviewed and updated.  Current Outpatient Prescriptions on File Prior to Visit  Medication Sig Dispense Refill  . citalopram (CELEXA) 40 MG tablet Take 1 tablet (40 mg total) by mouth daily.  90 tablet  3  . fluticasone (FLONASE) 50 MCG/ACT nasal spray USE 2 SPRAYS IN EACH NOSTRIL ONCE DAILY  16 g  5  . hydrochlorothiazide (MICROZIDE) 12.5 MG capsule Take 1 capsule (12.5 mg total) by mouth daily.  90 capsule  1  . loratadine (CLARITIN) 10 MG tablet Take 10 mg by mouth daily.      Marland Kitchen LORazepam (ATIVAN) 1 MG tablet Take 1 tablet (1 mg total) by mouth every 8 (eight) hours as needed for anxiety.  90 tablet  5  . rosuvastatin (CRESTOR) 40 MG tablet TAKE 1 TABLET (40 MG TOTAL) BY MOUTH DAILY. NEEDS OFFICE VISIT/LABS  90 tablet  0  . SUMAtriptan (IMITREX) 100 MG tablet Take 1 tablet (100 mg total) by mouth every 2 (two) hours as needed for  migraine.  10 tablet  0  . amoxicillin (AMOXIL) 500 MG capsule Take 2 capsules (1,000 mg total) by mouth 3 (three) times daily.  60 capsule  0  . oxyCODONE-acetaminophen (PERCOCET/ROXICET) 5-325 MG per tablet Take 1 tablet by mouth every 4 (four) hours as needed.      . pantoprazole (PROTONIX) 40 MG tablet Take 1 tablet (40 mg total) by mouth daily.  90 tablet  0   No current facility-administered medications on file prior to visit.    Review of Systems:  As per HPI- otherwise negative.   Physical Examination: Filed Vitals:   05/21/13 0925  BP: 110/80  Pulse: 83  Temp: 98.4 F (36.9 C)  Resp: 16   Filed Vitals:   05/21/13 0925  Height: 5' 6"  (1.676 m)  Weight: 218 lb 9.6 oz (99.156 kg)   Body mass index is 35.3 kg/(m^2). Ideal Body Weight: Weight in (lb) to have BMI = 25: 154.6  GEN: WDWN, NAD, Non-toxic, A & O x 3, obese HEENT: Atraumatic, Normocephalic. Neck supple. No masses, No LAD. Ears and Nose: No external deformity. CV: RRR, No M/G/R. No JVD. No thrill. No extra heart sounds. PULM: CTA B, no wheezes, crackles, rhonchi. No retractions. No resp. distress. No accessory muscle use. ABD: S, NT, ND, +BS. No rebound. No HSM. EXTR: No c/c/e NEURO Normal gait.  PSYCH: Normally interactive. Conversant. Not depressed or anxious appearing.  Calm demeanor.   [facility] reading (PRIMARY) by  Dr. Lorelei Pont. CXR: negative  EKG:  NSR, no ST elevation or depression *RADIOLOGY REPORT*  Clinical Data: Preoperative evaluation  CHEST - 2 VIEW  Comparison: None.  Findings: Lungs clear.  Heart size and pulmonary vascularity are normal.  No adenopathy.  No bone lesions.  IMPRESSION: No abnormality noted.   Original Report Authenticated By: Lowella Grip, M.D.     Last Resulted: 05/21/13 12:19 PM                Results for orders placed in visit on 05/21/13  COMPREHENSIVE METABOLIC PANEL      Result Value Range   Sodium 136  135 - 145 mEq/L   Potassium 3.8  3.5 - 5.3  mEq/L   Chloride 104  96 - 112 mEq/L   CO2 23  19 - 32 mEq/L   Glucose, Bld 128 (*) 70 - 99 mg/dL   BUN 12  6 - 23 mg/dL   Creat 0.73  0.50 - 1.10 mg/dL   Total Bilirubin 0.3  0.3 - 1.2 mg/dL   Alkaline Phosphatase 128 (*) 39 - 117 U/L   AST 28  0 - 37 U/L   ALT 45 (*) 0 - 35 U/L   Total Protein 7.0  6.0 - 8.3 g/dL   Albumin 4.3  3.5 - 5.2 g/dL   Calcium 9.6  8.4 - 10.5 mg/dL  TSH      Result Value Range   TSH 0.746  0.350 - 4.500 uIU/mL  LIPID PANEL      Result Value Range   Cholesterol 270 (*) 0 - 200 mg/dL   Triglycerides 223 (*) <150 mg/dL   HDL 61  >39 mg/dL   Total CHOL/HDL Ratio 4.4     VLDL 45 (*) 0 - 40 mg/dL   LDL Cholesterol 164 (*) 0 - 99 mg/dL  POCT CBC      Result Value Range   WBC 9.1  4.6 - 10.2 K/uL   Lymph, poc 1.4  0.6 - 3.4   POC LYMPH PERCENT 15.5  10 - 50 %L   MID (cbc) 0.5  0 - 0.9   POC MID % 5.4  0 - 12 %M   POC Granulocyte 7.2 (*) 2 - 6.9   Granulocyte percent 79.1  37 - 80 %G   RBC 5.16  4.04 - 5.48 M/uL   Hemoglobin 14.2  12.2 - 16.2 g/dL   HCT, POC 43.1  37.7 - 47.9 %   MCV 83.5  80 - 97 fL   MCH, POC 27.5  27 - 31.2 pg   MCHC 32.9  31.8 - 35.4 g/dL   RDW, POC 15.8     Platelet Count, POC 450 (*) 142 - 424 K/uL   MPV 7.9  0 - 99.8 fL  POCT GLYCOSYLATED HEMOGLOBIN (HGB A1C)      Result Value Range   Hemoglobin A1C 6.3     Called and tried to have her recent stress test report faxed, but there is a problem with the fax system.  Called her cardiology ofice and received verbal report: Stress test performed on 07/02/2012, read by [facility] Treadmill echo Poor functional capacity, 2 minute bruce.  EF 60%.  Normal stress echo and EKG, no evidence of ischemia at adequate peak HR.   Assessment and Plan: Pre-operative cardiovascular examination - Plan: DG Chest 2 View, EKG 12-Lead  High cholesterol - Plan: Comprehensive metabolic panel, Lipid panel, rosuvastatin (CRESTOR) 40 MG tablet  Type II or unspecified type diabetes mellitus without  mention of complication, uncontrolled - Plan: POCT CBC, POCT glycosylated hemoglobin (Hb A1C), TSH  Here for a pre- op evaluation today.  She is to have sinus surgery.  I will fax a letter to her ENT when her results come in.   Negative stress echo one year ago.    Signed Lamar Blinks, MD  Called both numbers- cell did not have VM set up, home number seems to be a fax.  Asked Pamala Hurry to fax her clearance letter.

## 2013-05-22 ENCOUNTER — Encounter: Payer: Self-pay | Admitting: *Deleted

## 2013-05-22 NOTE — Telephone Encounter (Signed)
Note faxed to pt employer.

## 2013-05-23 ENCOUNTER — Encounter: Payer: Self-pay | Admitting: Family Medicine

## 2013-05-23 ENCOUNTER — Telehealth: Payer: Self-pay

## 2013-05-23 NOTE — Telephone Encounter (Signed)
[facility] called to inform Dr. Lorelei Pont that they have received medical clearance for this patient.

## 2013-05-26 ENCOUNTER — Encounter: Payer: Self-pay | Admitting: Family Medicine

## 2013-06-16 ENCOUNTER — Other Ambulatory Visit: Payer: Self-pay | Admitting: Internal Medicine

## 2013-07-14 ENCOUNTER — Ambulatory Visit (INDEPENDENT_AMBULATORY_CARE_PROVIDER_SITE_OTHER): Payer: BC Managed Care – PPO | Admitting: Internal Medicine

## 2013-07-14 DIAGNOSIS — E785 Hyperlipidemia, unspecified: Secondary | ICD-10-CM

## 2013-07-14 DIAGNOSIS — M899 Disorder of bone, unspecified: Secondary | ICD-10-CM

## 2013-07-14 DIAGNOSIS — I10 Essential (primary) hypertension: Secondary | ICD-10-CM

## 2013-07-14 DIAGNOSIS — E119 Type 2 diabetes mellitus without complications: Secondary | ICD-10-CM

## 2013-07-14 DIAGNOSIS — M8580 Other specified disorders of bone density and structure, unspecified site: Secondary | ICD-10-CM

## 2013-07-14 LAB — POCT CBC
Hemoglobin: 14 g/dL (ref 12.2–16.2)
Lymph, poc: 2.6 (ref 0.6–3.4)
MCH, POC: 27.3 pg (ref 27–31.2)
MCHC: 31.7 g/dL — AB (ref 31.8–35.4)
MCV: 86.2 fL (ref 80–97)
MID (cbc): 0.5 (ref 0–0.9)
MPV: 7.8 fL (ref 0–99.8)
POC Granulocyte: 4.8 (ref 2–6.9)
POC MID %: 6.2 %M (ref 0–12)
Platelet Count, POC: 362 10*3/uL (ref 142–424)
RBC: 5.12 M/uL (ref 4.04–5.48)
WBC: 7.9 10*3/uL (ref 4.6–10.2)

## 2013-07-14 LAB — LIPID PANEL
LDL Cholesterol: 101 mg/dL — ABNORMAL HIGH (ref 0–99)
Total CHOL/HDL Ratio: 3.4 Ratio

## 2013-07-14 LAB — COMPREHENSIVE METABOLIC PANEL
ALT: 31 U/L (ref 0–35)
AST: 23 U/L (ref 0–37)
Albumin: 3.9 g/dL (ref 3.5–5.2)
Alkaline Phosphatase: 131 U/L — ABNORMAL HIGH (ref 39–117)
BUN: 10 mg/dL (ref 6–23)
Calcium: 9.1 mg/dL (ref 8.4–10.5)
Chloride: 103 mEq/L (ref 96–112)
Creat: 0.76 mg/dL (ref 0.50–1.10)
Potassium: 3.7 mEq/L (ref 3.5–5.3)
Total Bilirubin: 0.3 mg/dL (ref 0.3–1.2)

## 2013-07-14 LAB — POCT GLYCOSYLATED HEMOGLOBIN (HGB A1C): Hemoglobin A1C: 6.3
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
## 2013-07-16 ENCOUNTER — Encounter: Payer: Self-pay | Admitting: Internal Medicine

## 2013-07-21 ENCOUNTER — Other Ambulatory Visit: Payer: Self-pay | Admitting: Internal Medicine

## 2013-07-21 NOTE — Telephone Encounter (Signed)
Pt is on the phone requesting refill on Ativan today. She states Dr Laney Pastor was supposed to refill this on the 12th. Please advise.

## 2013-07-23 ENCOUNTER — Other Ambulatory Visit: Payer: Self-pay

## 2013-09-20 ENCOUNTER — Other Ambulatory Visit: Payer: Self-pay | Admitting: Internal Medicine

## 2013-12-11 ENCOUNTER — Other Ambulatory Visit: Payer: Self-pay | Admitting: Physician Assistant

## 2013-12-16 ENCOUNTER — Other Ambulatory Visit: Payer: Self-pay | Admitting: Internal Medicine

## 2014-01-21 ENCOUNTER — Other Ambulatory Visit: Payer: Self-pay | Admitting: Internal Medicine

## 2014-01-23 NOTE — Telephone Encounter (Signed)
Called in.

## 2014-01-28 ENCOUNTER — Ambulatory Visit (INDEPENDENT_AMBULATORY_CARE_PROVIDER_SITE_OTHER): Payer: BC Managed Care – PPO | Admitting: Internal Medicine

## 2014-01-28 VITALS — BP 126/82 | HR 94 | Temp 98.3°F | Resp 14 | Ht 66.0 in | Wt 225.2 lb

## 2014-01-28 DIAGNOSIS — E119 Type 2 diabetes mellitus without complications: Secondary | ICD-10-CM

## 2014-01-28 DIAGNOSIS — M8580 Other specified disorders of bone density and structure, unspecified site: Secondary | ICD-10-CM

## 2014-01-28 DIAGNOSIS — M949 Disorder of cartilage, unspecified: Secondary | ICD-10-CM

## 2014-01-28 DIAGNOSIS — I10 Essential (primary) hypertension: Secondary | ICD-10-CM

## 2014-01-28 DIAGNOSIS — M899 Disorder of bone, unspecified: Secondary | ICD-10-CM

## 2014-01-28 DIAGNOSIS — F329 Major depressive disorder, single episode, unspecified: Secondary | ICD-10-CM

## 2014-01-28 DIAGNOSIS — F3289 Other specified depressive episodes: Secondary | ICD-10-CM

## 2014-01-28 DIAGNOSIS — E785 Hyperlipidemia, unspecified: Secondary | ICD-10-CM

## 2014-01-28 DIAGNOSIS — Z6836 Body mass index (BMI) 36.0-36.9, adult: Secondary | ICD-10-CM

## 2014-01-28 DIAGNOSIS — E7800 Pure hypercholesterolemia, unspecified: Secondary | ICD-10-CM

## 2014-01-28 DIAGNOSIS — K219 Gastro-esophageal reflux disease without esophagitis: Secondary | ICD-10-CM

## 2014-01-28 LAB — POCT CBC
Granulocyte percent: 56.2 %G (ref 37–80)
HCT, POC: 44.2 % (ref 37.7–47.9)
HEMOGLOBIN: 14.5 g/dL (ref 12.2–16.2)
Lymph, poc: 2.2 (ref 0.6–3.4)
MCH, POC: 27 pg (ref 27–31.2)
MCHC: 32.8 g/dL (ref 31.8–35.4)
MCV: 82.4 fL (ref 80–97)
MID (CBC): 0.5 (ref 0–0.9)
MPV: 8 fL (ref 0–99.8)
PLATELET COUNT, POC: 418 10*3/uL (ref 142–424)
POC Granulocyte: 3.5 (ref 2–6.9)
POC LYMPH PERCENT: 36.2 %L (ref 10–50)
POC MID %: 7.6 % (ref 0–12)
RBC: 5.37 M/uL (ref 4.04–5.48)
RDW, POC: 16.8 %
WBC: 6.2 10*3/uL (ref 4.6–10.2)

## 2014-01-28 LAB — LIPID PANEL
CHOL/HDL RATIO: 5.5 ratio
Cholesterol: 282 mg/dL — ABNORMAL HIGH (ref 0–200)
HDL: 51 mg/dL (ref >39)
LDL CALC: 161 mg/dL — AB (ref 0–99)
Triglycerides: 352 mg/dL — ABNORMAL HIGH (ref <150)
VLDL: 70 mg/dL — ABNORMAL HIGH (ref 0–40)

## 2014-01-28 LAB — COMPREHENSIVE METABOLIC PANEL
ALBUMIN: 4.3 g/dL (ref 3.5–5.2)
ALK PHOS: 143 U/L — AB (ref 39–117)
ALT: 96 U/L — AB (ref 0–35)
AST: 83 U/L — AB (ref 0–37)
BUN: 10 mg/dL (ref 6–23)
CALCIUM: 9.2 mg/dL (ref 8.4–10.5)
CO2: 27 mEq/L (ref 19–32)
Chloride: 100 mEq/L (ref 96–112)
Creat: 0.78 mg/dL (ref 0.50–1.10)
Glucose, Bld: 107 mg/dL — ABNORMAL HIGH (ref 70–99)
POTASSIUM: 3.6 meq/L (ref 3.5–5.3)
Sodium: 138 mEq/L (ref 135–145)
Total Bilirubin: 0.3 mg/dL (ref 0.2–1.2)
Total Protein: 7 g/dL (ref 6.0–8.3)

## 2014-01-28 LAB — POCT GLYCOSYLATED HEMOGLOBIN (HGB A1C): Hemoglobin A1C: 7

## 2014-01-28 MED ORDER — LORAZEPAM 1 MG PO TABS: ORAL_TABLET | ORAL | Status: DC

## 2014-01-28 MED ORDER — GABAPENTIN 300 MG PO CAPS: 300.0000 mg | ORAL_CAPSULE | Freq: Three times a day (TID) | ORAL | Status: DC

## 2014-01-28 MED ORDER — ROSUVASTATIN CALCIUM 40 MG PO TABS: ORAL_TABLET | ORAL | Status: DC

## 2014-01-28 MED ORDER — HYDROCHLOROTHIAZIDE 12.5 MG PO CAPS: ORAL_CAPSULE | ORAL | Status: DC

## 2014-01-28 MED ORDER — DEXLANSOPRAZOLE 60 MG PO CPDR: 60.0000 mg | DELAYED_RELEASE_CAPSULE | Freq: Every day | ORAL | Status: DC

## 2014-01-28 NOTE — Progress Notes (Signed)
Subjective:    Patient ID: Natalie Richard, female    DOB: 1966/08/29, 48 y.o.   MRN: [id number]  HPI This chart was scribed for [facility], by Lovena Le Day, Scribe. This patient was seen in room 12 and the patient's care was started at 9:40 AM.  HPI Comments: Natalie Richard is a 48 y.o. female who [facility] for regular 38-monthblood work and hgb A1C  She also [facility] [place] She reports that she is doing well w/Gabapentin. Discontinued and weaned celexa. She also reports that she recently began taking Nortripyline and began with 25 mg, then increased to 50 mg but it has been giving her a dry mouth, making her feel dizzy and have a HA. She has not yet been able to get back with her prescribing psychiatrist to talk about changing the dose or medicine for her and these side effects which she does not like.   She recently got a new job and was hired at [employer] full time work. She reports that she has 2 months of training before beginning her regular schedule. She has been using tramadol for her back pain but noticed that this also alleviates her migraine HAs when she takes a tramadol. She reports that she has not been taking her cholesterol medicine regularly recently. Dr. LUbaldo Glassingalso had her sing for small skin cancers that were removed from her hands and over the anterior aspect of both her thighs. She has already had a colonoscopy last year and had a polyp removed in her stomach; she had this done before age of [age]because of risk with her family hx.    Past Medical History  Diagnosis Date  . Cancer     skin- Right shin squamous cell  . Diabetes mellitus   . GERD (gastroesophageal reflux disease)   . Hyperlipidemia   . Hypertension   . Hematoma     on buttock  . Migraine   . Seasonal allergies   . Depression   . Anxiety     Allergies  Allergen Reactions  . Buspar [Buspirone] Other (See  Comments)    Lip swelling and rash numbness   . Augmentin [Amoxicillin-Pot Clavulanate] Nausea And Vomiting  . Ciprofloxacin     Developed itching with IV form in the hospital, but has since taken PO without a problem  . Doxycycline Itching  . Metformin And Related Nausea And Vomiting  . Pyridium [Phenazopyridine Hcl]     Does not remember reaction  . Sulfa Antibiotics     As a younger person had itching, but has taken more recently and did ok  . Lorabid [Loracarbef] Rash    Meds ordered this encounter  Medications  . TRAMADOL HCL PO    Sig: Take by mouth. As needed   Review of Systems  Constitutional: Negative for fever and chills.  Respiratory: Negative for cough and shortness of breath.   Cardiovascular: Negative for chest pain.  Gastrointestinal: Negative for abdominal pain.  Musculoskeletal: Negative for back pain.  depr stable off celexa    Objective:   Physical Exam Nursing note and vitals reviewed. Constitutional: Patient is oriented to person, place, and time. Patient appears well-developed and well-nourished. No distress.  HENT:  Head: Normocephalic and atraumatic.  Neck: Neck supple. No tracheal deviation present.  Cardiovascular: Normal rate, regular rhythm and normal heart sounds.  Good distal pulses. No murmur heard. Pulmonary/Chest:  Effort normal and breath sounds normal. No respiratory distress. Patient has no wheezes. Patient has no rales.  Musculoskeletal: Normal range of motion.  Neurological: Patient is alert and oriented to person, place, and time.  Skin: Skin is warm and dry.  Psychiatric: Patient has a normal mood and affect. Patient's behavior is normal.   Results for orders placed in visit on 01/28/14  POCT CBC      Result Value Ref Range   WBC 6.2  4.6 - 10.2 K/uL   Lymph, poc 2.2  0.6 - 3.4   POC LYMPH PERCENT 36.2  10 - 50 %L   MID (cbc) 0.5  0 - 0.9   POC MID % 7.6  0 - 12 %M   POC Granulocyte 3.5  2 - 6.9   Granulocyte percent 56.2  37  - 80 %G   RBC 5.37  4.04 - 5.48 M/uL   Hemoglobin 14.5  12.2 - 16.2 g/dL   HCT, POC 44.2  37.7 - 47.9 %   MCV 82.4  80 - 97 fL   MCH, POC 27.0  27 - 31.2 pg   MCHC 32.8  31.8 - 35.4 g/dL   RDW, POC 16.8     Platelet Count, POC 418  142 - 424 K/uL   MPV 8.0  0 - 99.8 fL  POCT GLYCOSYLATED HEMOGLOBIN (HGB A1C)      Result Value Ref Range   Hemoglobin A1C 7.0     Triage Vitals: BP 126/82  Pulse 94  Temp(Src) 98.3 F (36.8 C) (Oral)  Resp 14  Ht 5' 6"  (1.676 m)  Wt 225 lb 3.2 oz (102.15 kg)  BMI 36.37 kg/m2  SpO2 98%     Assessment & Plan:  Other and unspecified hyperlipidemia - Plan: Lipid panel  Osteopenia - Plan: Vit D  25 hydroxy (rtn osteoporosis monitoring)  HTN (hypertension), benign - Plan: POCT CBC, Comprehensive metabolic panel, hydrochlorothiazide (MICROZIDE) 12.5 MG capsule  Diabetes mellitus - Plan:Weight loss!!!!!  BMI 36.0-36.9,adult  High cholesterol - Plan: rosuvastatin (CRESTOR) 40 MG tablet  Depressive disorder, not elsewhere classified - Plan: gabapentin (NEURONTIN) 300 MG capsule, LORazepam (ATIVAN) 1 MG tablet, DISCONTINUED: LORazepam (ATIVAN) 1 MG tablet  GERD (gastroesophageal reflux disease) - Plan: dexlansoprazole (DEXILANT) 60 MG capsule  Meds ordered this encounter  Medications  . TRAMADOL HCL PO    Sig: Take by mouth. As needed  . dexlansoprazole (DEXILANT) 60 MG capsule    Sig: Take 1 capsule (60 mg total) by mouth daily.    Dispense:  90 capsule    Refill:  3  . gabapentin (NEURONTIN) 300 MG capsule    Sig: Take 1 capsule (300 mg total) by mouth 3 (three) times daily.    Dispense:  270 capsule    Refill:  1  . hydrochlorothiazide (MICROZIDE) 12.5 MG capsule    Sig: TAKE ONE CAPSULE BY MOUTH DAILY    Dispense:  90 capsule    Refill:  3  . rosuvastatin (CRESTOR) 40 MG tablet    Sig: TAKE 1 TABLET (40 MG TOTAL) BY MOUTH DAILY.    Dispense:  90 tablet    Refill:  3    PT NEEDS 90 DAY SUPPLY FOR INS COPAY  . LORazepam (ATIVAN) 1 MG  tablet    Sig: TAKE 1 TABLET EVERY 8 HOURS AS NEEDED FOR ANXIETY    Dispense:  90 tablet    Refill:  5    COORDINATION OF CARE: At 940 AM Discussed treatment  plan with patient which includes regular blood work. Patient agrees.  I personally performed the services described in this documentation, which was scribed in my presence. The recorded information has been reviewed and is accurate.  I have completed the patient encounter in its entirety as documented by the scribe, with editing by me where necessary. Kaylani Fromme P. Laney Pastor, M.D.

## 2014-01-29 LAB — VITAMIN D 25 HYDROXY (VIT D DEFICIENCY, FRACTURES): Vit D, 25-Hydroxy: 17 ng/mL — ABNORMAL LOW (ref 30–89)

## 2014-01-31 ENCOUNTER — Encounter: Payer: Self-pay | Admitting: Internal Medicine

## 2014-02-01 ENCOUNTER — Telehealth: Payer: Self-pay | Admitting: Internal Medicine

## 2014-02-01 DIAGNOSIS — E559 Vitamin D deficiency, unspecified: Secondary | ICD-10-CM

## 2014-02-01 NOTE — Telephone Encounter (Signed)
Ok to take crestor anytime Her psych made this change for a reason that might be a good reason so she should discuss the alternatives with him---taking a half dose makes good sense even if for 1-2 doses

## 2014-02-01 NOTE — Telephone Encounter (Signed)
Left message to return call 

## 2014-02-01 NOTE — Telephone Encounter (Signed)
Patient has questions about a medication that she and Dr. Laney Pastor discussed during her last OV. Patient received a call from [facility] as she was leaving this message.   [phone]

## 2014-02-01 NOTE — Telephone Encounter (Signed)
Dr. Laney Pastor spoke with pt about labs. --she is on Crestor but she doesn't take it every night. She will try to get in the habit of taking it everyday, but wanted to know if she could take it mid afternoon instead of at night. She can remember that better --pt is ok with being on Vit D --pt said she spoke with her psychiatrist. They told her to take the Nortriptyline at night and to cut the dose some but she is scared too. Doesn't want to take this drug anymore. She is very emotional and depressed with things that are going on right now and wants to know what you think about her starting the med you guys talked about (starts with an "R"??) or if she should go back on the Celexa. Please advise. Thanks

## 2014-02-02 ENCOUNTER — Other Ambulatory Visit: Payer: Self-pay | Admitting: Internal Medicine

## 2014-02-02 MED ORDER — VITAMIN D (ERGOCALCIFEROL) 1.25 MG (50000 UNIT) PO CAPS: 50000.0000 [IU] | ORAL_CAPSULE | ORAL | Status: DC

## 2014-02-02 MED ORDER — CITALOPRAM HYDROBROMIDE 40 MG PO TABS: 20.0000 mg | ORAL_TABLET | Freq: Every day | ORAL | Status: DC

## 2014-02-02 NOTE — Telephone Encounter (Signed)
Meds ordered this encounter  Medications  . Vitamin D, Ergocalciferol, (DRISDOL) 50000 UNITS CAPS capsule    Sig: Take 1 capsule (50,000 Units total) by mouth every 7 (seven) days.    Dispense:  12 capsule    Refill:  0  . citalopram (CELEXA) 40 MG tablet    Sig: Take 0.5 tablets (20 mg total) by mouth daily.    Dispense:  30 tablet    Refill:  0  ok to refill celexa since she has restarted it but still needs to f/u w/ her psychiatrist for furthe management

## 2014-02-02 NOTE — Telephone Encounter (Signed)
Patient notified and voiced understanding. She does and will follow up with psychiatrist.

## 2014-02-02 NOTE — Telephone Encounter (Signed)
Patient returning call from yesterday. Vit D sent to pharmacy. She has stopped taking Nortriptyline as of last Tuesday. She d/c because dry mouth, "spacy" feeling, feeling "out of her element", and dizziness. She did take 1/2 tab Celexa this am and dizziness resolved. She would like refills on Celexa. Please advise

## 2014-02-09 ENCOUNTER — Telehealth: Payer: Self-pay

## 2014-02-09 NOTE — Telephone Encounter (Signed)
PATIENT WANTED TO LET DR Laney Pastor KNOW THAT SHE TALKED WITH HER PSYCIATRIST AND WAS ADVISED THAT IT IS OK TO TAKE CITALOPRAM 20 MG.   SHE ASKED IF DR DOOLITTLE WILL FILL THE PRESCRIPTION

## 2014-02-10 NOTE — Telephone Encounter (Signed)
Ok to call in celexa 20 # 30 1 qd 5 refills

## 2014-02-10 NOTE — Telephone Encounter (Signed)
RX called to pharmacy.  LMVM (cell) [facility] [address]. Please call back with any further questions.

## 2014-02-11 ENCOUNTER — Telehealth: Payer: Self-pay

## 2014-02-11 NOTE — Telephone Encounter (Signed)
PATIENT WOULD LIKE TO KNOW IF DR. DOOLITTLE WOULD PRESCRIBE HER A DIABETIC ORAL MEDICATION UNTIL SHE CAN BECOME MORE FOCUSED. HE WANTS HER TO GET HER A1-C DOWN, BUT SHE IS HAVING A LOT OF STRESS AND SHE CAN NOT CONCENTRATE ON EXERCISE AND OTHER THINGS. SHE HAS STARTED A NEW JOB AND HER MOTHER IS NOT WELL. SHE FEELS VERY TIRED AND FATIGUED.  BEST PHONE [phone] ([place])  OR   [phone] (HOME)   [place].   [place]

## 2014-02-12 NOTE — Telephone Encounter (Signed)
Metformin would be 1st choice---did she have trouble with this in the past?

## 2014-02-12 NOTE — Telephone Encounter (Signed)
Actos 63m qd

## 2014-02-12 NOTE — Telephone Encounter (Signed)
Metformin--GI upset  The last med she tried was Actos and she did well on this med

## 2014-02-13 MED ORDER — PIOGLITAZONE HCL 15 MG PO TABS: 15.0000 mg | ORAL_TABLET | Freq: Every day | ORAL | Status: DC

## 2014-02-21 ENCOUNTER — Encounter: Payer: Self-pay | Admitting: Internal Medicine

## 2014-02-22 ENCOUNTER — Telehealth: Payer: Self-pay

## 2014-02-22 NOTE — Telephone Encounter (Signed)
DOOLITTLE - Pt said the citalopram had been reduced down to 20 mg, but she says its not working, so she has increased it back to 40 mg.  Wants to know if we can call in a refill of the 40 mg.  Says she has enough left to last a week or two.  [phone]

## 2014-02-23 NOTE — Telephone Encounter (Signed)
PATIENT CALLED BACK ABOUT MED REFILL. NEEDS 90 DAY REFILL, 40 MG [facility]XA [facility] [address] PATIENT PHONE: [phone]

## 2014-02-23 NOTE — Telephone Encounter (Signed)
Pended Rx please advise

## 2014-02-26 MED ORDER — CITALOPRAM HYDROBROMIDE 40 MG PO TABS: 20.0000 mg | ORAL_TABLET | Freq: Every day | ORAL | Status: DC

## 2014-03-20 ENCOUNTER — Telehealth: Payer: Self-pay

## 2014-03-20 NOTE — Telephone Encounter (Signed)
Pt of Dr. Laney Pastor is at home with a migraine ( which she is treated for here at out facility), and would like to know if we could do a Dr.'s note for her. Please advice pt at her home number

## 2014-03-20 NOTE — Telephone Encounter (Signed)
Pt states she just needs a note stating that she gets migraine headaches and she needs it to say that she is treated by Dr Laney Pastor for this condition. It does not need to have any other information on it.

## 2014-03-21 ENCOUNTER — Ambulatory Visit (INDEPENDENT_AMBULATORY_CARE_PROVIDER_SITE_OTHER): Payer: BC Managed Care – PPO | Admitting: Internal Medicine

## 2014-03-21 VITALS — BP 124/82 | HR 75 | Temp 98.3°F | Resp 16 | Ht 66.0 in | Wt 230.4 lb

## 2014-03-21 DIAGNOSIS — J019 Acute sinusitis, unspecified: Secondary | ICD-10-CM

## 2014-03-21 MED ORDER — AMOXICILLIN 500 MG PO CAPS: 1000.0000 mg | ORAL_CAPSULE | Freq: Two times a day (BID) | ORAL | Status: AC

## 2014-03-21 NOTE — Progress Notes (Signed)
Subjective:  This chart was scribed for [name], MD by Roxan Diesel, Scribe.  This patient was seen in [place] [facility] and the patient's care was started at 12:22 PM.   Patient ID: Natalie Richard, female    DOB: January 20, 1966, 48 y.o.   MRN: [id number]  Chief Complaint  Patient presents with   Headache    started Wednesday   Sinus Problem    bloody nasal discharge    HPI  HPI Comments: Natalie Richard is a 48 y.o. female who presents to [facility] complaining of a gradually-worsening headache that began yesterday.  She states she thought she was getting a migraine yesterday but her headache has since moved forward to the bridge of her nose.  Today while she was taking a shower she became lightheaded.  She also reports some nasal congestion.  She denies sneezing, eye itching, or visual changes.  She denies productive cough.  She denies any other recent allergy symptoms.    Patient Active Problem List   Diagnosis Date Noted   BMI 36.0-36.9,adult 03/19/2012   Hematoma 03/19/2012   Sinusitis 03/02/2012   HTN (hypertension), benign 01/09/2012   Osteopenia 01/09/2012   Migraines 01/09/2012   Insomnia 01/09/2012   Anxiety and depression 01/09/2012   Other and unspecified hyperlipidemia 01/09/2012   Diabetes mellitus 01/09/2012   Allergic rhinitis 01/09/2012    Past Medical History  Diagnosis Date   Cancer     skin- Right shin squamous cell   Diabetes mellitus    GERD (gastroesophageal reflux disease)    Hyperlipidemia    Hypertension    Hematoma     on buttock   Migraine    Seasonal allergies    Depression    Anxiety     Past Surgical History  Procedure Laterality Date   Abdominal hysterectomy  2004    [facility]   Eye surgery      Prior to Admission medications   Medication Sig Start Date End Date Taking? Authorizing Provider  citalopram (CELEXA) 40 MG tablet Take 0.5 tablets (20 mg total) by mouth daily.   Yes Leandrew Koyanagi, MD    dexlansoprazole (DEXILANT) 60 MG capsule Take 1 capsule (60 mg total) by mouth daily. 01/28/14  Yes Leandrew Koyanagi, MD  fluticasone [facility]) 50 MCG/ACT nasal spray USE 2 SPRAYS IN EACH NOSTRIL ONCE DAILY 12/11/13  Yes Leandrew Koyanagi, MD  gabapentin (NEURONTIN) 300 MG capsule Take 1 capsule (300 mg total) by mouth 3 (three) times daily. 01/28/14  Yes Leandrew Koyanagi, MD  hydrochlorothiazide (MICROZIDE) 12.5 MG capsule TAKE ONE CAPSULE BY MOUTH DAILY 01/28/14  Yes Leandrew Koyanagi, MD  loratadine (CLARITIN) 10 MG tablet Take 10 mg by mouth daily.   Yes Historical Provider, MD  LORazepam (ATIVAN) 1 MG tablet TAKE 1 TABLET EVERY 8 HOURS AS NEEDED FOR ANXIETY 01/28/14  Yes Leandrew Koyanagi, MD  pioglitazone (ACTOS) 15 MG tablet Take 1 tablet (15 mg total) by mouth daily. 02/13/14  Yes Leandrew Koyanagi, MD  rosuvastatin (CRESTOR) 40 MG tablet TAKE 1 TABLET (40 MG TOTAL) BY MOUTH DAILY. 01/28/14  Yes Leandrew Koyanagi, MD  SUMAtriptan (IMITREX) 100 MG tablet Take 1 tablet (100 mg total) by mouth every 2 (two) hours as needed for migraine. 03/07/13  Yes Leandrew Koyanagi, MD  TRAMADOL HCL PO Take by mouth. As needed   Yes Historical Provider, MD  Vitamin D, Ergocalciferol, (DRISDOL) 50000 UNITS CAPS capsule Take 1 capsule (50,000 Units total) by  mouth every 7 (seven) days. 02/02/14  Yes Leandrew Koyanagi, MD     Review of Systems  HENT: Positive for congestion and sinus pressure. Negative for sneezing.   Eyes: Negative for itching and visual disturbance.  Neurological: Positive for light-headedness and headaches.       Objective:   Physical Exam  Nursing note and vitals reviewed. Constitutional: She is oriented to person, place, and time. She appears well-developed and well-nourished. No distress.  HENT:  Head: Normocephalic and atraumatic.  Mouth/Throat: Oropharynx is clear and moist. No oropharyngeal exudate.  Purulent discharge in both sides of nose  Eyes: EOM are normal. Pupils  are equal, round, and reactive to light.  Minor irritation in conjunctiva  Neck: Neck supple. No tracheal deviation present.  Cardiovascular: Normal rate, regular rhythm and normal heart sounds.   Pulmonary/Chest: Effort normal and breath sounds normal. No respiratory distress. She has no wheezes. She has no rales.  Musculoskeletal: Normal range of motion.  Lymphadenopathy:    She has no cervical adenopathy.  Neurological: She is alert and oriented to person, place, and time.  Skin: Skin is warm and dry.  Psychiatric: She has a normal mood and affect. Her behavior is normal.     BP 124/82   Pulse 75   Temp(Src) 98.3 F (36.8 C) (Oral)   Resp 16   Ht 5' 6"  (1.676 m)   Wt 230 lb 6.4 oz (104.509 kg)   BMI 37.21 kg/m2   SpO2 95%      Assessment & Plan:  Acute sinusitis, unspecified  Meds ordered this encounter  Medications   amoxicillin (AMOXIL) 500 MG capsule    Sig: Take 2 capsules (1,000 mg total) by mouth 2 (two) times daily.    Dispense:  40 capsule    Refill:  0       I have completed the patient encounter in its entirety as documented by the scribe, with editing by me where necessary. Robert P. Laney Pastor, M.D.

## 2014-04-02 ENCOUNTER — Telehealth: Payer: Self-pay

## 2014-04-02 NOTE — Telephone Encounter (Signed)
PT STATES SHE IS GETTING A COUGH AND WOULD LIKE TO HAVE SOMETHING CALLED IN PLEASE CALL [phone]    [facility] [address]

## 2014-04-02 NOTE — Telephone Encounter (Signed)
Left message on machine to call back and give more details. Was seen 03/21/14 for a sinusitis. May need to RTC

## 2014-04-03 ENCOUNTER — Telehealth: Payer: Self-pay | Admitting: Family Medicine

## 2014-04-03 MED ORDER — BENZONATATE 100 MG PO CAPS: 100.0000 mg | ORAL_CAPSULE | Freq: Three times a day (TID) | ORAL | Status: DC | PRN

## 2014-04-03 NOTE — Telephone Encounter (Signed)
Pt is already on an antibiotic. Is she using Clariton and Flonase? She can use Musinex to help with the congestion.

## 2014-04-03 NOTE — Telephone Encounter (Signed)
Pt returned call, she says she finished the antibiotics on Sunday. She says she woke up with sore throat, cough, headache and dizziness. She has tried sudafed, mucinex, and a few other medications. Patient states she blew her nose and it had blood in it as well. I advised that she may want to come back in, she wants [place] to check to see if something can be called in before she takes a trip back to the office.

## 2014-04-03 NOTE — Telephone Encounter (Signed)
Ok to print out of work note today. She needs to come in for a recheck if she is going to miss any more work.

## 2014-04-03 NOTE — Telephone Encounter (Signed)
lmom for pt to cb

## 2014-04-03 NOTE — Telephone Encounter (Signed)
Lm with instructions on pt cell vm

## 2014-04-03 NOTE — Telephone Encounter (Signed)
Continue Claritin and Flonase.  Add nasal saline spray or neti pot - blood tinged nasal drainage is likely due to dryness in her nasal passages.  Also recommend a humidifier.  I have sent a cough medicine to her pharmacy.  If not improving in the next several days she should RTC for recheck

## 2014-04-03 NOTE — Telephone Encounter (Signed)
Spoke to pt, she is aware that we can write a work note for today, but she will have to RTC for evaluation for any days other than today.

## 2014-04-03 NOTE — Telephone Encounter (Signed)
Patient called back. States Clarise Cruz called her on her cell phone but would like a call back at [phone] instead. Please return call. Thank you.

## 2014-04-03 NOTE — Telephone Encounter (Signed)
Spoke to pt, she understood the message. She states she has done all advised, and still feels terrible. I advised her to RTC for re-eval. She states she can not come in today.  She would like a work not for today. Please advise

## 2014-04-26 ENCOUNTER — Telehealth: Payer: Self-pay

## 2014-04-26 NOTE — Telephone Encounter (Signed)
Patient requesting a refill on "Citalopram 58m" 90 day supply. Per patient she was told by her pharmacy to contact our office to get it refilled-previously it was 279m Per patient her medical insurance will only cover it if it is a 90 day supply.  [facility] [address]patients call back number is [phone] after 1pm please call her on her cell at [phone]

## 2014-04-28 MED ORDER — CITALOPRAM HYDROBROMIDE 40 MG PO TABS: 20.0000 mg | ORAL_TABLET | Freq: Every day | ORAL | Status: DC

## 2014-04-28 NOTE — Telephone Encounter (Signed)
Sent in RF and notified pt on VM

## 2014-05-05 ENCOUNTER — Telehealth: Payer: Self-pay

## 2014-05-05 MED ORDER — CITALOPRAM HYDROBROMIDE 40 MG PO TABS: 40.0000 mg | ORAL_TABLET | Freq: Every day | ORAL | Status: DC

## 2014-05-05 NOTE — Telephone Encounter (Signed)
Prescription was changed to 25m tablets in May. Called in new script to the pharmacy. Pt advised.

## 2014-05-05 NOTE — Telephone Encounter (Signed)
Pt called in because we again sent her prescription in wrong. She said she is supposed to be on 40 mg one a day with a 90 day supply, not the 20 mg, and not breaking the 40 mg in half. Please advise pt.

## 2014-05-26 ENCOUNTER — Telehealth: Payer: Self-pay

## 2014-05-26 MED ORDER — PIOGLITAZONE HCL 15 MG PO TABS: 15.0000 mg | ORAL_TABLET | Freq: Every day | ORAL | Status: DC

## 2014-05-26 NOTE — Telephone Encounter (Signed)
Please advise patient that Rx was sent, and she needs an office visit and labs before she runs out.  Meds ordered this encounter  Medications  . pioglitazone (ACTOS) 15 MG tablet    Sig: Take 1 tablet (15 mg total) by mouth daily.    Dispense:  90 tablet    Refill:  0

## 2014-05-26 NOTE — Telephone Encounter (Signed)
Pt states she is out of her actos 61ms and would like to have a 90 day supply also would like to know when do she have to come back in for lab work Please call [phone]  [facility] [address]

## 2014-05-26 NOTE — Telephone Encounter (Signed)
Sent in refill Actos- pt requires 90 day supply- advised her I would forward for review. Labs due last month- she will come in this weekend when Dr. Laney Pastor is here.

## 2014-06-08 ENCOUNTER — Ambulatory Visit (INDEPENDENT_AMBULATORY_CARE_PROVIDER_SITE_OTHER): Payer: BC Managed Care – PPO | Admitting: Internal Medicine

## 2014-06-08 VITALS — BP 116/82 | HR 73 | Temp 97.6°F | Resp 16 | Ht 66.0 in | Wt 237.5 lb

## 2014-06-08 DIAGNOSIS — K219 Gastro-esophageal reflux disease without esophagitis: Secondary | ICD-10-CM

## 2014-06-08 DIAGNOSIS — R945 Abnormal results of liver function studies: Secondary | ICD-10-CM

## 2014-06-08 DIAGNOSIS — R7989 Other specified abnormal findings of blood chemistry: Secondary | ICD-10-CM

## 2014-06-08 DIAGNOSIS — E785 Hyperlipidemia, unspecified: Secondary | ICD-10-CM

## 2014-06-08 DIAGNOSIS — E119 Type 2 diabetes mellitus without complications: Secondary | ICD-10-CM

## 2014-06-08 DIAGNOSIS — I10 Essential (primary) hypertension: Secondary | ICD-10-CM

## 2014-06-08 DIAGNOSIS — Z6836 Body mass index (BMI) 36.0-36.9, adult: Secondary | ICD-10-CM

## 2014-06-08 LAB — POCT CBC
Granulocyte percent: 62 %G (ref 37–80)
HEMATOCRIT: 41.3 % (ref 37.7–47.9)
Hemoglobin: 13.3 g/dL (ref 12.2–16.2)
LYMPH, POC: 2.1 (ref 0.6–3.4)
MCH, POC: 25.5 pg — AB (ref 27–31.2)
MCHC: 32.1 g/dL (ref 31.8–35.4)
MCV: 79.6 fL — AB (ref 80–97)
MID (CBC): 0.2 (ref 0–0.9)
MPV: 6.7 fL (ref 0–99.8)
PLATELET COUNT, POC: 351 10*3/uL (ref 142–424)
POC GRANULOCYTE: 3.7 (ref 2–6.9)
POC LYMPH PERCENT: 35 %L (ref 10–50)
POC MID %: 3 %M (ref 0–12)
RBC: 5.19 M/uL (ref 4.04–5.48)
RDW, POC: 16.9 %
WBC: 6 10*3/uL (ref 4.6–10.2)

## 2014-06-08 LAB — LIPID PANEL
CHOL/HDL RATIO: 2.9 ratio
Cholesterol: 173 mg/dL (ref 0–200)
HDL: 59 mg/dL (ref >39)
LDL CALC: 87 mg/dL (ref 0–99)
TRIGLYCERIDES: 135 mg/dL (ref <150)
VLDL: 27 mg/dL (ref 0–40)

## 2014-06-08 LAB — MICROALBUMIN, URINE: MICROALB UR: 0.5 mg/dL (ref 0.00–1.89)

## 2014-06-08 LAB — COMPREHENSIVE METABOLIC PANEL
ALK PHOS: 130 U/L — AB (ref 39–117)
ALT: 58 U/L — ABNORMAL HIGH (ref 0–35)
AST: 50 U/L — AB (ref 0–37)
Albumin: 4.1 g/dL (ref 3.5–5.2)
BILIRUBIN TOTAL: 0.4 mg/dL (ref 0.2–1.2)
BUN: 12 mg/dL (ref 6–23)
CALCIUM: 9.2 mg/dL (ref 8.4–10.5)
CO2: 28 mEq/L (ref 19–32)
CREATININE: 0.85 mg/dL (ref 0.50–1.10)
Chloride: 103 mEq/L (ref 96–112)
Glucose, Bld: 123 mg/dL — ABNORMAL HIGH (ref 70–99)
Potassium: 3.8 mEq/L (ref 3.5–5.3)
Sodium: 140 mEq/L (ref 135–145)
Total Protein: 6.9 g/dL (ref 6.0–8.3)

## 2014-06-08 LAB — POCT GLYCOSYLATED HEMOGLOBIN (HGB A1C): Hemoglobin A1C: 6.8

## 2014-06-08 LAB — HEPATITIS C ANTIBODY: HCV Ab: NEGATIVE

## 2014-06-08 MED ORDER — PIOGLITAZONE HCL 15 MG PO TABS: 15.0000 mg | ORAL_TABLET | Freq: Every day | ORAL | Status: DC

## 2014-06-08 NOTE — Progress Notes (Addendum)
Subjective:    Patient ID: Natalie Richard, female    DOB: 03/31/1966, 48 y.o.   MRN: [id number]  HPI  Chief Complaint  Patient presents with  . Follow-up    Patient states she is behind on lab work    Patient Active Problem List   Diagnosis Date Noted  . BMI 36.0-36.9,adult 03/19/2012  . Hematoma 03/19/2012  . Sinusitis 03/02/2012  . HTN (hypertension), benign 01/09/2012  . Osteopenia 01/09/2012  . Migraines 01/09/2012  . Insomnia 01/09/2012  . Anxiety and depression 01/09/2012  . Other and unspecified hyperlipidemia 01/09/2012  . Diabetes mellitus 01/09/2012  . 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 Authorizing Provider  citalopram (CELEXA) 40 MG tablet Take 1 tablet (40 mg total) by mouth daily. 05/05/14  Yes Mancel Bale, PA-C  dexlansoprazole (DEXILANT) 60 MG capsule Take 1 capsule (60 mg total) by mouth daily. 01/28/14  Yes Leandrew Koyanagi, MD  fluticasone [facility]) 50 MCG/ACT nasal spray USE 2 SPRAYS IN EACH NOSTRIL ONCE DAILY 12/11/13  Yes Leandrew Koyanagi, MD  gabapentin (NEURONTIN) 300 MG capsule Take 1 capsule (300 mg total) by mouth 3 (three) times daily. 01/28/14  Yes Leandrew Koyanagi, MD  hydrochlorothiazide (MICROZIDE) 12.5 MG capsule TAKE ONE CAPSULE BY MOUTH DAILY 01/28/14  Yes Leandrew Koyanagi, MD  loratadine (CLARITIN) 10 MG tablet Take 10 mg by mouth daily.   Yes Historical Provider, MD  LORazepam  (ATIVAN) 1 MG tablet TAKE 1 TABLET EVERY 8 HOURS AS NEEDED FOR ANXIETY 01/28/14  Yes Leandrew Koyanagi, MD  pioglitazone (ACTOS) 15 MG tablet Take 1 tablet (15 mg total) by mouth daily. 05/26/14  Yes Chelle S Jeffery, PA-C  rosuvastatin (CRESTOR) 40 MG tablet TAKE 1 TABLET (40 MG TOTAL) BY MOUTH DAILY. 01/28/14  Yes Leandrew Koyanagi, MD  SUMAtriptan (IMITREX) 100 MG tablet Take 1 tablet (100 mg total) by mouth every 2 (two) hours as needed for migraine. 03/07/13  Yes Leandrew Koyanagi, MD  TRAMADOL HCL PO Take by mouth. As needed   Yes Historical Provider, MD  benzonatate (TESSALON) 100 MG capsule Take 1-2 capsules (100-200 mg total) by mouth 3 (three) times daily as needed for cough. 04/03/14   Eleanore Kurtis Bushman, PA-C  Vitamin D, Ergocalciferol, (DRISDOL) 50000 UNITS CAPS capsule Take 1 capsule (50,000 Units total) by mouth every 7 (seven) days. 02/02/14   Leandrew Koyanagi, MD      Review of Systems  Constitutional: Positive for fatigue.  Eyes: Negative for visual disturbance.  Respiratory: Negative for shortness of breath and wheezing.   Cardiovascular: Negative for chest pain, palpitations and leg swelling.  Gastrointestinal: Negative for abdominal pain.  Genitourinary: Negative for difficulty urinating.  Musculoskeletal: Negative for arthralgias.  Neurological: Negative for light-headedness.  Psychiatric/Behavioral: Negative for dysphoric mood.       Objective:   Physical Exam  Nursing note and vitals reviewed. Constitutional: She is oriented to person, place, and time.  She appears well-developed and well-nourished. No distress.  HENT:  Head: Normocephalic and atraumatic.  Eyes: Pupils are equal, round, and reactive to light.  Neck: Normal range of motion.  Cardiovascular: Normal rate and regular rhythm.   Pulmonary/Chest: Effort normal. No respiratory distress.  Musculoskeletal: Normal range of motion.  Neurological: She is alert and oriented to person, place, and time.  No  sensory losses in lower extremities  Skin: Skin is warm and dry.  Psychiatric: She has a normal mood and affect. Her behavior is normal.          Assessment & Plan:  BMI 36.0-36.9,adult - Plan: POCT CBC  Other and unspecified hyperlipidemia - Plan: Lipid panel, POCT CBC  Type 2 diabetes mellitus without complication - Plan: POCT glycosylated hemoglobin (Hb A1C), Microalbumin, urine, POCT CBC, CANCELED: CBC with Differential  Gastroesophageal reflux disease without esophagitis - Plan: POCT CBC  Essential hypertension - Plan: Comprehensive metabolic panel, POCT CBC  Abnormal LFTs - Plan: Comprehensive metabolic panel, Hepatitis C antibody, POCT CBC   4 pounds a month weight loss goal She will call for any med refills needed besides Actos which has been refilled Followup 6 months    Addend--- she continues with mild elevation of liver studies including alkaline phosphatase///abdominal ultrasound has been positive for hepatic steatosis///hepatitis C antibody negative///she remains asymptomatic and her laboratory work is not progressively abnormal At followup blood testing we will consider the following: ?Hepatitis A IgG ?Hepatitis B surface antigen, surface antibody, and core antibody ?Plasma iron, ferritin, and total iron binding capacity ?Serum gammaglobulin level, antinuclear antibody, antismooth muscle antibody, and anti-liver/kidney microsomal antibody-1  - Wilson disease - thyroid disorders have been excluded - celiac disease - alpha-1 antitrypsin deficiency - Budd-Chiari syndrome. Giving the clinical course and her obvious obesity hyperlipidemia and insulin resistance consistent with metabolic syndrome these other things are unlikely

## 2014-06-09 ENCOUNTER — Encounter: Payer: Self-pay | Admitting: Internal Medicine

## 2014-06-20 ENCOUNTER — Telehealth: Payer: Self-pay

## 2014-06-20 DIAGNOSIS — F3289 Other specified depressive episodes: Secondary | ICD-10-CM

## 2014-06-20 DIAGNOSIS — F329 Major depressive disorder, single episode, unspecified: Secondary | ICD-10-CM

## 2014-06-20 MED ORDER — GABAPENTIN 300 MG PO CAPS: 300.0000 mg | ORAL_CAPSULE | Freq: Three times a day (TID) | ORAL | Status: DC

## 2014-06-20 NOTE — Telephone Encounter (Signed)
Done

## 2014-06-20 NOTE — Telephone Encounter (Signed)
Patient requesting Dr. Laney Pastor to refill her "Gabapentin 300 mg", medication she usually gets from Dr Erling Cruz. Per patient Dr. Erling Cruz is out of town and she is unable to get it refilled. Patient uses [facility] call back number is [phone]

## 2014-07-21 ENCOUNTER — Ambulatory Visit (INDEPENDENT_AMBULATORY_CARE_PROVIDER_SITE_OTHER): Payer: BC Managed Care – PPO | Admitting: Internal Medicine

## 2014-07-21 VITALS — BP 112/80 | HR 79 | Temp 98.8°F | Resp 16 | Ht 66.0 in | Wt 245.0 lb

## 2014-07-21 DIAGNOSIS — H65192 Other acute nonsuppurative otitis media, left ear: Secondary | ICD-10-CM

## 2014-07-21 MED ORDER — AMOXICILLIN 500 MG PO CAPS: 1000.0000 mg | ORAL_CAPSULE | Freq: Two times a day (BID) | ORAL | Status: AC

## 2014-07-21 NOTE — Progress Notes (Signed)
   Subjective:  This chart was scribed for [name], MD by Randa Evens, ED Scribe. This Patient was seen in room 11 and the patients care was started at 6:57 PM   Patient ID: Natalie Richard, female    DOB: May 03, 1966, 48 y.o.   MRN: [id number]  Chief Complaint  Patient presents with  . Otalgia    both, x 1 week  . Nasal Congestion  . Headache    HPI HPI Comments: Natalie Richard is a 48 y.o. female who presents to the [facility] complaining of bilateral throbbing ear pain for 1 week the left worse than the right. She states she has associated nasal congestion, headache, productive cough mostly in the morning, nausea and fatigue. She states her symptoms feels similar to allergies. She states she hasn't taking any medications prior to arrival. She denies vomiting      Review of Systems  Constitutional: Positive for fatigue.  HENT: Positive for congestion and ear pain.   Respiratory: Positive for cough.   Gastrointestinal: Negative for vomiting.  Neurological: Positive for headaches.     Objective:   BP 112/80  Pulse 79  Temp(Src) 98.8 F (37.1 C)  Resp 16  Ht 5' 6"  (1.676 m)  Wt 245 lb (111.131 kg)  BMI 39.56 kg/m2  SpO2 95%   Physical Exam  Nursing note and vitals reviewed. Constitutional: She is oriented to person, place, and time. She appears well-developed and well-nourished. No distress.  HENT:  Head: Normocephalic and atraumatic.  Right Ear: Tympanic membrane normal.  Left Ear: Tympanic membrane is erythematous and bulging.  Mouth/Throat: Oropharynx is clear and moist.  Nares congested.   Eyes: Conjunctivae and EOM are normal.  Neck: Neck supple.  Cardiovascular: Normal rate.   Pulmonary/Chest: Effort normal and breath sounds normal. No respiratory distress.  Musculoskeletal: Normal range of motion.  Neurological: She is alert and oriented to person, place, and time.  Skin: Skin is warm and dry.  Psychiatric: She has a  normal mood and affect. Her behavior is normal.      Assessment & Plan:  Acute nonsuppurative otitis media of left ear  Meds ordered this encounter  Medications  . amoxicillin (AMOXIL) 500 MG capsule    Sig: Take 2 capsules (1,000 mg total) by mouth 2 (two) times daily.    Dispense:  40 capsule    Refill:  0       I have completed the patient encounter in its entirety as documented by the scribe, with editing by me where necessary. Geovanie Winnett P. Laney Pastor, M.D.

## 2014-07-23 ENCOUNTER — Telehealth: Payer: Self-pay

## 2014-07-23 NOTE — Telephone Encounter (Signed)
Spoke to pt- still having some ear pain and wanted to extend work note for today. Letter has been written- up front for pick up. Pt advised if needs another extension she would need to be seen.

## 2014-07-23 NOTE — Telephone Encounter (Signed)
[place] - Pt was seen for ear infection.  She was written out of work until today, but needs an extension on that.   Please call [phone]

## 2014-08-09 ENCOUNTER — Telehealth: Payer: Self-pay

## 2014-08-09 NOTE — Telephone Encounter (Signed)
Pt's rx pioglitazone (ACTOS)  was written incorrectly for 15 mg,  the pt needs 30 mg to be written [id number] [place] [place] [address]

## 2014-08-11 MED ORDER — PIOGLITAZONE HCL 30 MG PO TABS: 30.0000 mg | ORAL_TABLET | Freq: Every day | ORAL | Status: DC

## 2014-08-11 NOTE — Telephone Encounter (Signed)
Notified pt done

## 2014-08-11 NOTE — Telephone Encounter (Signed)
Meds ordered this encounter  Medications  . pioglitazone (ACTOS) 30 MG tablet    Sig: Take 1 tablet (30 mg total) by mouth daily.    Dispense:  90 tablet    Refill:  1    Order Specific Question:  Supervising Provider    Answer:  DOOLITTLE, ROBERT P [[id number]]

## 2014-08-11 NOTE — Telephone Encounter (Signed)
Pt states she was taking 2 15 mg tablets of the Actos. She states Dr. Laney Pastor was going to send in the script for 30 mg tablets- due to last blood work. Do not see documentation in regards to this. Please advise.

## 2014-08-16 ENCOUNTER — Other Ambulatory Visit: Payer: Self-pay | Admitting: Internal Medicine

## 2014-08-18 NOTE — Telephone Encounter (Signed)
Called in.

## 2014-09-24 ENCOUNTER — Other Ambulatory Visit: Payer: Self-pay | Admitting: Internal Medicine

## 2014-09-25 NOTE — Telephone Encounter (Signed)
Called in.

## 2014-10-04 ENCOUNTER — Other Ambulatory Visit: Payer: Self-pay | Admitting: Family Medicine

## 2014-10-04 ENCOUNTER — Ambulatory Visit (INDEPENDENT_AMBULATORY_CARE_PROVIDER_SITE_OTHER): Payer: BLUE CROSS/BLUE SHIELD | Admitting: Family Medicine

## 2014-10-04 ENCOUNTER — Ambulatory Visit (INDEPENDENT_AMBULATORY_CARE_PROVIDER_SITE_OTHER): Payer: BLUE CROSS/BLUE SHIELD

## 2014-10-04 VITALS — BP 112/78 | HR 81 | Temp 97.8°F | Resp 18 | Ht 65.5 in | Wt 245.4 lb

## 2014-10-04 DIAGNOSIS — J0110 Acute frontal sinusitis, unspecified: Secondary | ICD-10-CM

## 2014-10-04 DIAGNOSIS — Z658 Other specified problems related to psychosocial circumstances: Secondary | ICD-10-CM

## 2014-10-04 DIAGNOSIS — S0033XA Contusion of nose, initial encounter: Secondary | ICD-10-CM

## 2014-10-04 DIAGNOSIS — S060X0A Concussion without loss of consciousness, initial encounter: Secondary | ICD-10-CM

## 2014-10-04 MED ORDER — AMOXICILLIN 875 MG PO TABS: 875.0000 mg | ORAL_TABLET | Freq: Two times a day (BID) | ORAL | Status: DC

## 2014-10-04 NOTE — Patient Instructions (Signed)
Let me know if your sinuses do not feel better in the next few days.  You may still have a mild concussion.   Take it easy and rest as much as you can.  Avoid watching TV, reading, using computer or phone until you are feeling normal again Let me know if you do not feel back to normal in the next week or so

## 2014-10-04 NOTE — Progress Notes (Signed)
[facility] [address] [place] [address] [phone]  Date:  10/04/2014   Name:  Natalie Richard   DOB:  [birth year]   MRN:  [id number]  PCP:  Leandrew Koyanagi, MD    Chief Complaint: Ear Pain; Cough; Headache; Nausea; Fatigue; and Eye Pain   History of Present Illness:  Natalie Richard is a 49 y.o. very pleasant female patient who presents with the following:  Here today with illness.  On 12/24 she opened a car door- the car was was on a slant, the door fell open and hit her in the right side of her hose. She noted that she felt dizzy and "goofy feeling" for a couple of days, wondered if she had a concussion.  The area is still sore to touch.  She then noted that her sinuses seemed congested a couple of days later, felt "like pressure, I thought it was from the hit but it continued on."  Yesterday she coughed up some bloody sputum and today some green sputum.  She noted that she is tired and "streesed out."  Her left ear hurts and she has noted a ST in the am yesterday and today.   Nausea but no vomiting.  Thinks that she might have a concussion- she has felt tired and "slow," feels overwhelmed easily.  She does not have her usual energy She does not really have a HA but notes some pressure in her sinuses.  She has tried some saline to clean out her sinuses.    She is able to tolerate amoxicillin- augmentin just causes her to have GI effects.  She "felt dizzy" with lorabid in the past- this was "a long time ago- maybe 15 years" and she has taken amox successfully several timessince then.    Admits that she is under a lot of stress right now. She quit her job in order to care for her mother who is ill; the plan was for her to be paid by her mom.  However after 2 weeks "she fired me, so now I'm looking for a job again." Her mother was in the hospital, then in rehab.  She has a PEG tube. She really does need care but she "keeps firing the nurses that we hire."     Patient Active Problem List   Diagnosis Date Noted  . BMI 36.0-36.9,adult 03/19/2012  . Hematoma 03/19/2012  . Sinusitis 03/02/2012  . HTN (hypertension), benign 01/09/2012  . Osteopenia 01/09/2012  . Migraines 01/09/2012  . Insomnia 01/09/2012  . Anxiety and depression 01/09/2012  . Other and unspecified hyperlipidemia 01/09/2012  . Diabetes mellitus 01/09/2012  . Allergic rhinitis 01/09/2012    Past Medical History  Diagnosis Date  . Cancer     skin- Right shin squamous cell  . Diabetes mellitus   . GERD (gastroesophageal reflux disease)   . Hyperlipidemia   . Hypertension   . Hematoma     on buttock  . Migraine   . Seasonal allergies   . Depression   . Anxiety     Past Surgical History  Procedure Laterality Date  . Abdominal hysterectomy  2004    [facility]  . Eye surgery      History  Substance Use Topics  . Smoking status: Never Smoker   . Smokeless tobacco: Never Used  . Alcohol Use: No     Comment: Socially    Family History  Problem Relation Age of Onset  . Cancer Mother  colon  . Diabetes Mother   . Rheum arthritis Mother   . Cancer Father     bone and prostate  . Cancer Maternal Grandmother     breast  . Diabetes Maternal Grandfather     Allergies  Allergen Reactions  . Buspar [Buspirone] Other (See Comments)    Lip swelling and rash numbness   . Augmentin [Amoxicillin-Pot Clavulanate] Nausea And Vomiting  . Ciprofloxacin     Developed itching with IV form in the hospital, but has since taken PO without a problem  . Doxycycline Itching  . Metformin And Related Nausea And Vomiting  . Pyridium [Phenazopyridine Hcl]     Does not remember reaction  . Sulfa Antibiotics     As a younger person had itching, but has taken more recently and did ok  . Lorabid [Loracarbef] Rash    Medication list has been reviewed and updated.  Current Outpatient Prescriptions on File Prior to Visit  Medication Sig Dispense Refill  . citalopram (CELEXA)  40 MG tablet Take 1 tablet (40 mg total) by mouth daily. 90 tablet 1  . dexlansoprazole (DEXILANT) 60 MG capsule Take 1 capsule (60 mg total) by mouth daily. 90 capsule 3  . fluticasone (FLONASE) 50 MCG/ACT nasal spray USE 2 SPRAYS IN EACH NOSTRIL ONCE DAILY 16 g 6  . gabapentin (NEURONTIN) 300 MG capsule Take 1 capsule (300 mg total) by mouth 3 (three) times daily. 270 capsule 1  . hydrochlorothiazide (MICROZIDE) 12.5 MG capsule TAKE ONE CAPSULE BY MOUTH DAILY 90 capsule 3  . loratadine (CLARITIN) 10 MG tablet Take 10 mg by mouth daily.    Marland Kitchen LORazepam (ATIVAN) 1 MG tablet TAKE 1 TABLET BY MOUTH EVERY 8 HOURS AS NEEDED FOR ANXIETY 90 tablet 0  . pioglitazone (ACTOS) 30 MG tablet Take 1 tablet (30 mg total) by mouth daily. 90 tablet 1  . rosuvastatin (CRESTOR) 40 MG tablet TAKE 1 TABLET (40 MG TOTAL) BY MOUTH DAILY. 90 tablet 3  . SUMAtriptan (IMITREX) 100 MG tablet Take 1 tablet (100 mg total) by mouth every 2 (two) hours as needed for migraine. 10 tablet 0  . TRAMADOL HCL PO Take by mouth. As needed    . benzonatate (TESSALON) 100 MG capsule Take 1-2 capsules (100-200 mg total) by mouth 3 (three) times daily as needed for cough. (Patient not taking: Reported on 10/04/2014) 40 capsule 0  . Vitamin D, Ergocalciferol, (DRISDOL) 50000 UNITS CAPS capsule Take 1 capsule (50,000 Units total) by mouth every 7 (seven) days. (Patient not taking: Reported on 10/04/2014) 12 capsule 0   No current facility-administered medications on file prior to visit.    Review of Systems:  As per HPI- otherwise negative.   Physical Examination: Filed Vitals:   10/04/14 1423  BP: 112/78  Pulse: 81  Temp: 97.8 F (36.6 C)  Resp: 18   Filed Vitals:   10/04/14 1423  Height: 5' 5.5" (1.664 m)  Weight: 245 lb 6.4 oz (111.313 kg)   Body mass index is 40.2 kg/(m^2). Ideal Body Weight: Weight in (lb) to have BMI = 25: 152.2  GEN: WDWN, NAD, Non-toxic, A & O x 3, obese, looks well HEENT: Atraumatic, Normocephalic.  Neck supple. No masses, No LAD.  Bilateral TM wnl, oropharynx normal.  PEERL,EOMI.   No bruise or laceration on her nose.  Tenderness at the proximal right nasal bone.   Mild tenderness over the bilateral frontal sinuses   Ears and Nose: No external deformity. CV: RRR, No M/G/R.  No JVD. No thrill. No extra heart sounds. PULM: CTA B, no wheezes, crackles, rhonchi. No retractions. No resp. distress. No accessory muscle use. EXTR: No c/c/e NEURO Normal gait. Moves all her limbs normally and fluidly  PSYCH: Normally interactive. Conversant. Not depressed or anxious appearing.  Calm demeanor.   [facility] reading (PRIMARY) by  Dr. Lorelei Pont. Sinuses: negative for fracture, no fluid Nasal bones: negative  NASAL BONES - 3+ VIEW  COMPARISON: None.  FINDINGS: Two views study shows no evidence for nasal bone fracture. No evidence for air-fluid levels in the maxillary sinuses. Frontal sinuses are hypoplastic.  IMPRESSION: No evidence for nasal bone fracture.  PARANASAL SINUSES - 1-2 VIEW  COMPARISON: None.  FINDINGS: Frontal view of the phase shows the maxillary sinuses to be clear. Frontal sinuses are hypoplastic. Mastoid air cells are aerated.  IMPRESSION: No evidence for fluid levels within the maxillary sinuses.  Assessment and Plan: Contusion, nose, initial encounter - Plan: DG Nasal Bones, CANCELED: DG Sinuses Complete  Acute frontal sinusitis, recurrence not specified - Plan: amoxicillin (AMOXIL) 875 MG tablet  Concussion with no loss of consciousness, initial encounter  Psychosocial stressors   Contusion to nose.  No evidence for fracture of her nose or facial bones. However she does have pain and pressure with mucus suggestive of a sinus infection.  Will treat with amoxicillin  Concussion; discussed with her in detail.  Recommended rest, avoidance of "screens."  She will follow-up if not better in the next few days Stressors: encouraged her to allow her concussion to heal  prior to trying to solve the situation with her mother. Suggested the care patrol as a good way to look into SNF and home care options.    Signed Lamar Blinks, MD

## 2014-10-20 ENCOUNTER — Other Ambulatory Visit: Payer: Self-pay | Admitting: Internal Medicine

## 2014-11-11 ENCOUNTER — Other Ambulatory Visit: Payer: Self-pay | Admitting: Internal Medicine

## 2014-11-12 ENCOUNTER — Telehealth: Payer: Self-pay

## 2014-11-12 NOTE — Telephone Encounter (Signed)
RX for ativan at 102 for pickup, pt notified.

## 2014-12-07 ENCOUNTER — Ambulatory Visit (INDEPENDENT_AMBULATORY_CARE_PROVIDER_SITE_OTHER): Payer: BLUE CROSS/BLUE SHIELD | Admitting: Physician Assistant

## 2014-12-07 VITALS — BP 116/86 | HR 92 | Temp 97.9°F | Resp 16 | Ht 65.75 in | Wt 241.2 lb

## 2014-12-07 DIAGNOSIS — J0100 Acute maxillary sinusitis, unspecified: Secondary | ICD-10-CM

## 2014-12-07 MED ORDER — CLARITHROMYCIN ER 500 MG PO TB24: 1000.0000 mg | ORAL_TABLET | Freq: Every day | ORAL | Status: AC

## 2014-12-07 NOTE — Patient Instructions (Signed)
Get plenty of rest and drink at least 64 ounces of water daily. 

## 2014-12-07 NOTE — Progress Notes (Signed)
Patient ID: Natalie Richard, female    DOB: 05/02/1966, 49 y.o.   MRN: [id number]  PCP: Leandrew Koyanagi, MD  Subjective:   Chief Complaint  Patient presents with  . Cough  . Ear Pain  . Nasal Congestion  . Dizziness  . Fatigue    HPI  Several weeks of illness. She describes cough, nasal congestion, dizziness and fatigue, and ear pain. "Gunk in my throat that I've been coughing up." "I think it's all allergies." Treated with Amoxicillin for similar symptoms in January, but never got completely well. Tired and fatigued. Using nasal saline, and notes greenish and blood tinge to drainage.  Takes Claritin and Flonase daily. Feels intermittent nausea, which she thinks is due to the drainage. This am her ear has been hurting, and during her shower this am she felt dizzy and light-headed.  Had some constipation, thought due to recent new job. Improving with OTC pro-biotic.  Her multiple medication allergies are reviewed. She tolerates amoxicillin, but Augmentin causes GI upset. Developed rash with IV cipro, but has tolerated oral cipro since then. Had itching with sufla drugs as a child, but has since tolerated them without adverse effect. Felt dizzy with Lorabid. Itching with doxycycline.   Review of Systems Review of Systems As above.    Patient Active Problem List   Diagnosis Date Noted  . BMI 36.0-36.9,adult 03/19/2012  . Hematoma 03/19/2012  . Sinusitis 03/02/2012  . HTN (hypertension), benign 01/09/2012  . Osteopenia 01/09/2012  . Migraines 01/09/2012  . Insomnia 01/09/2012  . Anxiety and depression 01/09/2012  . Other and unspecified hyperlipidemia 01/09/2012  . Diabetes mellitus 01/09/2012  . Allergic rhinitis 01/09/2012     Prior to Admission medications   Medication Sig Start Date End Date Taking? Authorizing Provider  citalopram (CELEXA) 40 MG tablet TAKE 1 TABLET BY MOUTH EVERY DAY 10/21/14  Yes Gay Filler Copland, MD  dexlansoprazole (DEXILANT)  60 MG capsule Take 1 capsule (60 mg total) by mouth daily. 01/28/14  Yes Leandrew Koyanagi, MD  fluticasone [facility]) 50 MCG/ACT nasal spray USE 2 SPRAYS IN EACH NOSTRIL ONCE DAILY 12/11/13  Yes Leandrew Koyanagi, MD  gabapentin (NEURONTIN) 300 MG capsule Take 1 capsule (300 mg total) by mouth 3 (three) times daily. 06/20/14  Yes Leandrew Koyanagi, MD  hydrochlorothiazide (MICROZIDE) 12.5 MG capsule TAKE ONE CAPSULE BY MOUTH DAILY 01/28/14  Yes Leandrew Koyanagi, MD  loratadine (CLARITIN) 10 MG tablet Take 10 mg by mouth daily.   Yes Historical Provider, MD  LORazepam (ATIVAN) 1 MG tablet TAKE 1 TABLET BY MOUTH EVERY 8 HOURS AS NEEDED FOR ANXIETY 11/12/14  Yes Leandrew Koyanagi, MD  pioglitazone (ACTOS) 30 MG tablet Take 1 tablet (30 mg total) by mouth daily. 08/11/14  Yes Reylene Stauder S Cassia Fein, PA-C  rosuvastatin (CRESTOR) 40 MG tablet TAKE 1 TABLET (40 MG TOTAL) BY MOUTH DAILY. 01/28/14  Yes Leandrew Koyanagi, MD  TRAMADOL HCL PO Take by mouth. As needed   Yes Historical Provider, MD     Allergies  Allergen Reactions  . Buspar [Buspirone] Other (See Comments)    Lip swelling and rash numbness   . Augmentin [Amoxicillin-Pot Clavulanate] Nausea And Vomiting  . Ciprofloxacin     Developed itching with IV form in the hospital, but has since taken PO without a problem  . Doxycycline Itching  . Metformin And Related Nausea And Vomiting  . Pyridium [Phenazopyridine Hcl]     Does not remember reaction  . Sulfa Antibiotics  As a younger person had itching, but has taken more recently and did ok  . Lorabid [Loracarbef] Rash       Objective:  Physical Exam  Physical Exam  Constitutional: She is oriented to person, place, and time. Vital signs are normal. She appears well-developed and well-nourished. No distress.  BP 116/86 mmHg  Pulse 92  Temp(Src) 97.9 F (36.6 C) (Oral)  Resp 16  Ht 5' 5.75" (1.67 m)  Wt 241 lb 4 oz (109.43 kg)  BMI 39.24 kg/m2  SpO2 97%   HENT:  Head:  Normocephalic and atraumatic.  Right Ear: Hearing, tympanic membrane, external ear and ear canal normal.  Left Ear: Hearing, tympanic membrane, external ear and ear canal normal.  Nose: Mucosal edema present. No rhinorrhea or nose lacerations.  No foreign bodies. Right sinus exhibits no maxillary sinus tenderness and no frontal sinus tenderness. Left sinus exhibits no maxillary sinus tenderness and no frontal sinus tenderness.  Mouth/Throat: Uvula is midline, oropharynx is clear and moist and mucous membranes are normal. No uvula swelling. No oropharyngeal exudate.  Eyes: Conjunctivae and EOM are normal. Pupils are equal, round, and reactive to light. Right eye exhibits no discharge. Left eye exhibits no discharge. No scleral icterus.  Neck: Trachea normal, normal range of motion and full passive range of motion without pain. Neck supple. No thyroid mass and no thyromegaly present.  Cardiovascular: Normal rate, regular rhythm and normal heart sounds.   Pulmonary/Chest: Effort normal and breath sounds normal.  Lymphadenopathy:       Head (right side): No submandibular, no tonsillar, no preauricular, no posterior auricular and no occipital adenopathy present.       Head (left side): No submandibular, no tonsillar, no preauricular and no occipital adenopathy present.    She has no cervical adenopathy.       Right: No supraclavicular adenopathy present.       Left: No supraclavicular adenopathy present.  Neurological: She is alert and oriented to person, place, and time. She has normal strength. No cranial nerve deficit or sensory deficit.  Skin: Skin is warm, dry and intact. No rash noted.  Psychiatric: She has a normal mood and affect. Her speech is normal and behavior is normal.           Assessment & Plan:   1. Subacute maxillary sinusitis Continue nasal saline rinses, Flonase and Claritin, add OTC Mucinex. Rest, fluids. Avoid steroids due to diabetes. Re-evaluation if symptoms persist.  May need CT scan and/or ENT evaluation. - clarithromycin (BIAXIN XL) 500 MG 24 hr tablet; Take 2 tablets (1,000 mg total) by mouth daily.  Dispense: 20 tablet; Refill: 0   Fara Chute, PA-C Physician Assistant-Certified [facility]

## 2014-12-08 ENCOUNTER — Telehealth: Payer: Self-pay

## 2014-12-08 MED ORDER — BENZONATATE 100 MG PO CAPS: 100.0000 mg | ORAL_CAPSULE | Freq: Three times a day (TID) | ORAL | Status: DC | PRN

## 2014-12-08 MED ORDER — HYDROCOD POLST-CHLORPHEN POLST 10-8 MG/5ML PO LQCR: 5.0000 mL | Freq: Two times a day (BID) | ORAL | Status: DC | PRN

## 2014-12-08 NOTE — Telephone Encounter (Signed)
Spoke with pt, she would like the Rx for Tussionex. Pamala Hurry advised Rx was ready to pick up.

## 2014-12-08 NOTE — Telephone Encounter (Signed)
Rx printed

## 2014-12-08 NOTE — Telephone Encounter (Signed)
Pt states she had seen Chelle and wasn't given anything for her cough which kept her up all night long Please call [phone] or [phone]     [facility]

## 2014-12-08 NOTE — Telephone Encounter (Signed)
Called pt, Left message for pt to call back.

## 2014-12-08 NOTE — Telephone Encounter (Signed)
She declined my offer for anti-tussive when she was here. I've sent a prescription for tessalon perles. If she'd rather have Tussionex, I'm happy for her to have that, but she'll have to come pick up the prescription.

## 2014-12-08 NOTE — Telephone Encounter (Signed)
Can we Rx something for cough?

## 2014-12-11 ENCOUNTER — Telehealth: Payer: Self-pay

## 2014-12-11 NOTE — Telephone Encounter (Signed)
Patient wants Chelle to know she is coughing up fifty cent size phlegm.  [phone]

## 2014-12-11 NOTE — Telephone Encounter (Signed)
Patient states that is coughing up "half dollar sized" phlegm. She has requested an out of work note from 12/09/2014-12/14/2014 and wants to return on 12/15/2014. Please advide.  [phone]

## 2014-12-11 NOTE — Telephone Encounter (Signed)
Letter written. Pt notified.

## 2014-12-11 NOTE — Telephone Encounter (Signed)
Advised pt to RTC to by the weekend if she is not feeling better. Pt agreed.

## 2014-12-17 ENCOUNTER — Ambulatory Visit (INDEPENDENT_AMBULATORY_CARE_PROVIDER_SITE_OTHER): Payer: BLUE CROSS/BLUE SHIELD

## 2014-12-17 ENCOUNTER — Ambulatory Visit (INDEPENDENT_AMBULATORY_CARE_PROVIDER_SITE_OTHER): Payer: BLUE CROSS/BLUE SHIELD | Admitting: Family Medicine

## 2014-12-17 VITALS — BP 106/78 | HR 80 | Temp 98.1°F | Resp 16 | Ht 66.0 in | Wt 238.0 lb

## 2014-12-17 DIAGNOSIS — R5381 Other malaise: Secondary | ICD-10-CM | POA: Diagnosis not present

## 2014-12-17 DIAGNOSIS — R05 Cough: Secondary | ICD-10-CM

## 2014-12-17 DIAGNOSIS — R059 Cough, unspecified: Secondary | ICD-10-CM

## 2014-12-17 DIAGNOSIS — E119 Type 2 diabetes mellitus without complications: Secondary | ICD-10-CM | POA: Diagnosis not present

## 2014-12-17 DIAGNOSIS — E876 Hypokalemia: Secondary | ICD-10-CM

## 2014-12-17 LAB — POCT URINALYSIS DIPSTICK
BILIRUBIN UA: NEGATIVE
Glucose, UA: NEGATIVE
Ketones, UA: NEGATIVE
Leukocytes, UA: NEGATIVE
NITRITE UA: NEGATIVE
PROTEIN UA: NEGATIVE
RBC UA: NEGATIVE
SPEC GRAV UA: 1.025
Urobilinogen, UA: 0.2
pH, UA: 7

## 2014-12-17 LAB — POCT UA - MICROSCOPIC ONLY
BACTERIA, U MICROSCOPIC: NEGATIVE
CRYSTALS, UR, HPF, POC: NEGATIVE
Casts, Ur, LPF, POC: NEGATIVE
MUCUS UA: NEGATIVE
RBC, URINE, MICROSCOPIC: NEGATIVE
Yeast, UA: NEGATIVE

## 2014-12-17 LAB — COMPREHENSIVE METABOLIC PANEL
ALT: 59 U/L — AB (ref 0–35)
AST: 48 U/L — AB (ref 0–37)
Albumin: 4.1 g/dL (ref 3.5–5.2)
Alkaline Phosphatase: 134 U/L — ABNORMAL HIGH (ref 39–117)
BUN: 8 mg/dL (ref 6–23)
CO2: 27 meq/L (ref 19–32)
CREATININE: 1.01 mg/dL (ref 0.50–1.10)
Calcium: 8.9 mg/dL (ref 8.4–10.5)
Chloride: 98 mEq/L (ref 96–112)
Glucose, Bld: 157 mg/dL — ABNORMAL HIGH (ref 70–99)
Potassium: 3 mEq/L — ABNORMAL LOW (ref 3.5–5.3)
SODIUM: 138 meq/L (ref 135–145)
TOTAL PROTEIN: 7.4 g/dL (ref 6.0–8.3)
Total Bilirubin: 0.5 mg/dL (ref 0.2–1.2)

## 2014-12-17 LAB — POCT INFLUENZA A/B
Influenza A, POC: NEGATIVE
Influenza B, POC: NEGATIVE

## 2014-12-17 LAB — POCT GLYCOSYLATED HEMOGLOBIN (HGB A1C): Hemoglobin A1C: 7

## 2014-12-17 LAB — POCT CBC
Granulocyte percent: 62.1 %G (ref 37–80)
HEMATOCRIT: 44.3 % (ref 37.7–47.9)
Hemoglobin: 13.3 g/dL (ref 12.2–16.2)
Lymph, poc: 2.4 (ref 0.6–3.4)
MCH, POC: 24 pg — AB (ref 27–31.2)
MCHC: 30.2 g/dL — AB (ref 31.8–35.4)
MCV: 79.5 fL — AB (ref 80–97)
MID (cbc): 0.4 (ref 0–0.9)
MPV: 6.9 fL (ref 0–99.8)
POC GRANULOCYTE: 4.5 (ref 2–6.9)
POC LYMPH %: 32.2 % (ref 10–50)
POC MID %: 5.7 %M (ref 0–12)
Platelet Count, POC: 400 10*3/uL (ref 142–424)
RBC: 5.57 M/uL — AB (ref 4.04–5.48)
RDW, POC: 17 %
WBC: 7.3 10*3/uL (ref 4.6–10.2)

## 2014-12-17 NOTE — Patient Instructions (Signed)
I will be in touch with the rest of your labs asap If you are getting worse please call me Rest and try to start eating

## 2014-12-17 NOTE — Progress Notes (Addendum)
[facility] [address] [place] [address] [phone]  Date:  12/17/2014   Name:  Natalie Richard   DOB:  03-07-1966   MRN:  [id number]  PCP:  Leandrew Koyanagi, MD    Chief Complaint: Dizziness; Fatigue; Cough; and Nasal Congestion   History of Present Illness:  Natalie Richard is a 49 y.o. very pleasant female patient who presents with the following:  Seen here 10 days ago with illness/ sinuitis, treated with biaxin.  She was also seen in January after she hit her face and has sinus pain; she had negative x-rays of her facial bones and was treated for sinusitis.    The day after her most recent visit she went back to work, but "that just about wiped me out."  She had fatigue and cough.   We added tussionex to her regimen, she stayed out of work for the rest of last week.   She notes that she continues to feel tired, "I have no energy, I'm just blah."  She finished the biaxin.  Yesterday she noted that she broke out in a couple of sweats, her husband noted that she "felt clammy."  She gets tired with minimal exertion.  They have not noted a definite fever She is still coughing but this is better.  She has not checked her temperature.  She still does not have much of an appetite   S/p hysterectomy  She notes some burning feeling when she does urinate but she is not urinating frquenely.   She had some ginger ale today, no food  Patient Active Problem List   Diagnosis Date Noted  . BMI 36.0-36.9,adult 03/19/2012  . Hematoma 03/19/2012  . Sinusitis 03/02/2012  . HTN (hypertension), benign 01/09/2012  . Osteopenia 01/09/2012  . Migraines 01/09/2012  . Insomnia 01/09/2012  . Anxiety and depression 01/09/2012  . Other and unspecified hyperlipidemia 01/09/2012  . Diabetes mellitus 01/09/2012  . Allergic rhinitis 01/09/2012    Past Medical History  Diagnosis Date  . Cancer     skin- Right shin squamous cell  . Diabetes mellitus   . GERD  (gastroesophageal reflux disease)   . Hyperlipidemia   . Hypertension   . Hematoma     on buttock  . Migraine   . Seasonal allergies   . Depression   . Anxiety     Past Surgical History  Procedure Laterality Date  . Abdominal hysterectomy  2004    [facility]  . Eye surgery      History  Substance Use Topics  . Smoking status: Never Smoker   . Smokeless tobacco: Never Used  . Alcohol Use: 0.0 - 1.2 oz/week    0-2 Standard drinks or equivalent per week     Comment: occasionally    Family History  Problem Relation Age of Onset  . Cancer Mother 41    colon  . Diabetes Mother   . Rheum arthritis Mother   . Cancer Father 70    bone and prostate  . Cancer Maternal Grandmother     breast  . Diabetes Maternal Grandfather     Allergies  Allergen Reactions  . Buspar [Buspirone] Other (See Comments)    Lip swelling and rash numbness   . Augmentin [Amoxicillin-Pot Clavulanate] Nausea And Vomiting  . Ciprofloxacin     Developed itching with IV form in the hospital, but has since taken PO without a problem  . Doxycycline Itching  . Metformin And Related Nausea And  Vomiting  . Pyridium [Phenazopyridine Hcl]     Does not remember reaction  . Sulfa Antibiotics     As a younger person had itching, but has taken more recently and did ok  . Lorabid [Loracarbef] Rash    Medication list has been reviewed and updated.  Current Outpatient Prescriptions on File Prior to Visit  Medication Sig Dispense Refill  . benzonatate (TESSALON) 100 MG capsule Take 1-2 capsules (100-200 mg total) by mouth 3 (three) times daily as needed for cough. 40 capsule 0  . chlorpheniramine-HYDROcodone (TUSSIONEX PENNKINETIC ER) 10-8 MG/5ML LQCR Take 5 mLs by mouth every 12 (twelve) hours as needed for cough (cough). 100 mL 0  . citalopram (CELEXA) 40 MG tablet TAKE 1 TABLET BY MOUTH EVERY DAY 90 tablet 1  . clarithromycin (BIAXIN XL) 500 MG 24 hr tablet Take 2 tablets (1,000 mg total) by mouth daily. 20  tablet 0  . dexlansoprazole (DEXILANT) 60 MG capsule Take 1 capsule (60 mg total) by mouth daily. 90 capsule 3  . fluticasone (FLONASE) 50 MCG/ACT nasal spray USE 2 SPRAYS IN EACH NOSTRIL ONCE DAILY 16 g 6  . gabapentin (NEURONTIN) 300 MG capsule Take 1 capsule (300 mg total) by mouth 3 (three) times daily. 270 capsule 1  . hydrochlorothiazide (MICROZIDE) 12.5 MG capsule TAKE ONE CAPSULE BY MOUTH DAILY 90 capsule 3  . loratadine (CLARITIN) 10 MG tablet Take 10 mg by mouth daily.    Marland Kitchen LORazepam (ATIVAN) 1 MG tablet TAKE 1 TABLET BY MOUTH EVERY 8 HOURS AS NEEDED FOR ANXIETY 90 tablet 0  . pioglitazone (ACTOS) 30 MG tablet Take 1 tablet (30 mg total) by mouth daily. 90 tablet 1  . rosuvastatin (CRESTOR) 40 MG tablet TAKE 1 TABLET (40 MG TOTAL) BY MOUTH DAILY. 90 tablet 3  . TRAMADOL HCL PO Take by mouth. As needed     No current facility-administered medications on file prior to visit.    Review of Systems:  As per HPI- otherwise negative. Lab Results  Component Value Date   HGBA1C 6.8 06/08/2014   BP Readings from Last 3 Encounters:  12/17/14 106/78  12/07/14 116/86  10/04/14 112/78      Physical Examination: Filed Vitals:   12/17/14 1101  BP: 106/78  Pulse: 90  Temp: 98.1 F (36.7 C)  Resp: 16   Filed Vitals:   12/17/14 1101  Height: 5' 6"  (1.676 m)  Weight: 238 lb (107.956 kg)   Body mass index is 38.43 kg/(m^2). Ideal Body Weight: Weight in (lb) to have BMI = 25: 154.6  GEN: WDWN, NAD, Non-toxic, A & O x 3, obese, looks well HEENT: Atraumatic, Normocephalic. Neck supple. No masses, No LAD.  Bilateral TM wnl, oropharynx normal.  PEERL,EOMI.   Ears and Nose: No external deformity. CV: RRR, No M/G/R. No JVD. No thrill. No extra heart sounds. PULM: CTA B, no wheezes, crackles, rhonchi. No retractions. No resp. distress. No accessory muscle use. ABD: S, NT, ND, +BS. No rebound. No HSM.  Benign exam EXTR: No c/c/e NEURO Normal gait.  PSYCH: Normally interactive.  Conversant. Not depressed or anxious appearing.  Calm demeanor.   [facility] reading (PRIMARY) by  Dr. Lorelei Pont. CXR: negative chest CHEST 2 VIEW  COMPARISON: 05/21/2013  FINDINGS: Normal heart size and mediastinal contours. No acute infiltrate or edema. There is stable and symmetric apical pleural thickening. No effusion or pneumothorax. No acute osseous findings.  IMPRESSION: No pneumonia.  Results for orders placed or performed in visit on 12/17/14  POCT CBC  Result Value Ref Range   WBC 7.3 4.6 - 10.2 K/uL   Lymph, poc 2.4 0.6 - 3.4   POC LYMPH PERCENT 32.2 10 - 50 %L   MID (cbc) 0.4 0 - 0.9   POC MID % 5.7 0 - 12 %M   POC Granulocyte 4.5 2 - 6.9   Granulocyte percent 62.1 37 - 80 %G   RBC 5.57 (A) 4.04 - 5.48 M/uL   Hemoglobin 13.3 12.2 - 16.2 g/dL   HCT, POC 44.3 37.7 - 47.9 %   MCV 79.5 (A) 80 - 97 fL   MCH, POC 24.0 (A) 27 - 31.2 pg   MCHC 30.2 (A) 31.8 - 35.4 g/dL   RDW, POC 17.0 %   Platelet Count, POC 400 142 - 424 K/uL   MPV 6.9 0 - 99.8 fL  POCT Influenza A/B  Result Value Ref Range   Influenza A, POC Negative    Influenza B, POC Negative   POCT glycosylated hemoglobin (Hb A1C)  Result Value Ref Range   Hemoglobin A1C 7.0   POCT UA - Microscopic Only  Result Value Ref Range   WBC, Ur, HPF, POC 0-1    RBC, urine, microscopic neg    Bacteria, U Microscopic neg    Mucus, UA neg    Epithelial cells, urine per micros 3-6    Crystals, Ur, HPF, POC neg    Casts, Ur, LPF, POC neg    Yeast, UA neg   POCT urinalysis dipstick  Result Value Ref Range   Color, UA yellow    Clarity, UA clear    Glucose, UA neg    Bilirubin, UA neg    Ketones, UA neg    Spec Grav, UA 1.025    Blood, UA neg    pH, UA 7.0    Protein, UA neg    Urobilinogen, UA 0.2    Nitrite, UA neg    Leukocytes, UA Negative     Assessment and Plan: Malaise - Plan: POCT CBC, Comprehensive metabolic panel, POCT UA - Microscopic Only, POCT urinalysis dipstick  Cough - Plan: POCT Influenza  A/B, DG Chest 2 View  Diabetes mellitus type 2, controlled - Plan: POCT glycosylated hemoglobin (Hb A1C)  Non-specific fatigue after recent illness.  Reassured that this is likely benign, await the rest of her labs Will be in touch with further labs and she is to let me know if any worsening.  She will try to start eating a bit more as nausea allows Her DM control is acceptable  Signed Lamar Blinks, MD  Called her on 3/19; she is feeling a lot better.  Let her know that her K is low; encouraged her to eat higher potassium foods such as bananas and potatoes, and asked her to come in for a repeat K in about one week. She agreed.  Discussed elevated LFTs; this is actually improved and is thought to be due to fatty liver per her knowledge

## 2014-12-20 ENCOUNTER — Encounter: Payer: Self-pay | Admitting: Family Medicine

## 2014-12-20 NOTE — Addendum Note (Signed)
Addended by: Lamar Blinks C on: 12/20/2014 05:58 PM   Modules accepted: Orders

## 2014-12-22 ENCOUNTER — Telehealth: Payer: Self-pay

## 2014-12-22 NOTE — Telephone Encounter (Signed)
Patient wanted to let Dr. Laney Pastor to know that she came in about a week ago and saw Dr. Lorelei Pont, she was told that her Potassium was low and that she needed to try to eat certain foods to help bring it up, and she is very concerned because she said this has been going on for about 2 weeks and she is doing everything that she can do to bring it up but she is still feeling very sluggish and not like herself. She wanted Dr. Laney Pastor to look over her lab results from the other day and kind of give her his opinion on what he thinks she needs to do. She also stated that she is going to be coming up giving more labs sometime this week and would like for him to look over those as well.  She would like to be called back on [phone].

## 2014-12-23 ENCOUNTER — Other Ambulatory Visit: Payer: Self-pay | Admitting: Internal Medicine

## 2014-12-23 NOTE — Telephone Encounter (Signed)
Spoke with pt. Notified of Dr. Ninfa Meeker message.

## 2014-12-23 NOTE — Telephone Encounter (Signed)
lmom to cb. 

## 2014-12-23 NOTE — Telephone Encounter (Signed)
The low potassium should Be secondary to her recent illness and treatment, and should be normal when is repeated Dr. Edilia Bo did a good job of ruling out all the other causes of fatigue

## 2014-12-23 NOTE — Telephone Encounter (Signed)
Please advise on what to tell pt.

## 2014-12-25 NOTE — Telephone Encounter (Signed)
Called in.

## 2015-01-06 ENCOUNTER — Other Ambulatory Visit (INDEPENDENT_AMBULATORY_CARE_PROVIDER_SITE_OTHER): Payer: BLUE CROSS/BLUE SHIELD

## 2015-01-06 DIAGNOSIS — E876 Hypokalemia: Secondary | ICD-10-CM

## 2015-01-06 LAB — BASIC METABOLIC PANEL
BUN: 11 mg/dL (ref 6–23)
CHLORIDE: 102 meq/L (ref 96–112)
CO2: 25 meq/L (ref 19–32)
Calcium: 9.1 mg/dL (ref 8.4–10.5)
Creat: 0.78 mg/dL (ref 0.50–1.10)
GLUCOSE: 121 mg/dL — AB (ref 70–99)
POTASSIUM: 3.8 meq/L (ref 3.5–5.3)
SODIUM: 138 meq/L (ref 135–145)

## 2015-01-27 ENCOUNTER — Ambulatory Visit (INDEPENDENT_AMBULATORY_CARE_PROVIDER_SITE_OTHER): Payer: BLUE CROSS/BLUE SHIELD | Admitting: Internal Medicine

## 2015-01-27 VITALS — BP 126/82 | HR 85 | Temp 98.4°F | Resp 18 | Ht 65.5 in | Wt 242.4 lb

## 2015-01-27 DIAGNOSIS — Z658 Other specified problems related to psychosocial circumstances: Secondary | ICD-10-CM

## 2015-01-27 DIAGNOSIS — R945 Abnormal results of liver function studies: Secondary | ICD-10-CM

## 2015-01-27 DIAGNOSIS — R7989 Other specified abnormal findings of blood chemistry: Secondary | ICD-10-CM | POA: Diagnosis not present

## 2015-01-27 DIAGNOSIS — F32A Depression, unspecified: Secondary | ICD-10-CM

## 2015-01-27 DIAGNOSIS — F329 Major depressive disorder, single episode, unspecified: Secondary | ICD-10-CM

## 2015-01-27 DIAGNOSIS — F418 Other specified anxiety disorders: Secondary | ICD-10-CM | POA: Diagnosis not present

## 2015-01-27 DIAGNOSIS — F419 Anxiety disorder, unspecified: Secondary | ICD-10-CM

## 2015-01-27 DIAGNOSIS — R5381 Other malaise: Secondary | ICD-10-CM | POA: Diagnosis not present

## 2015-01-27 NOTE — Progress Notes (Addendum)
Subjective:    Patient ID: Natalie Richard, female    DOB: Apr 12, 1966, 49 y.o.   MRN: [id number]  This chart was scribed for Leandrew Koyanagi, MD by Stephania Fragmin, ED Scribe. This patient was seen in room 14 and the patient's care was started at 7:11 PM.   HPI  Chief Complaint  Patient presents with  . Follow-up    feel very tired, fatigue   HPI Comments: Natalie Richard is a 49 y.o. female with a history of DM2, anxiety and depression who [facility] complaining of feeling very tired and fatigued.   She was seen 12/18/14 by Janett Billow Copland and had fatigue. Lab tests were mostly normal, including an a1c of 7.09Actos), except she had a somewhat low potassium level of 3.0. Patient reports her thyroid in the past has always been stable.   Patient has been having issues with her mother. 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 She then stopped taking care of her mother and got a part-time job with [employer] about 3 months ago. She thought she would be excited about finally getting her dream job, but has not been happy. She states all she does is sleep and does not have the energy to go out recreationally like she used to. She no longer regularly sees her mom, who is taking care of herself alone.  She also discovered her dog has CHF.  Patient has very little support at home. Patient's husband  has "not been taking care of her", as he is focused on going to the bar or taking care of his own mother. He has not been addressing the house needs for cleaning; their basement is full of black mold. Patient only sees her pets at home, but she recently discovered her dog has CHF.  Patient has been seeing a therapist, Rosealee Albee,( who is due to move from [place] to [place] soon), but she has not seen her for several weeks. She had been discussing these same issues with her  therapist, who advised her to take care of herself instead of focusing on her mother.  Patient takes 40 mg of Celexa every morning, as well as gabapentin and Lorazepam BID. She doesn't want to see her psychiatrist, who has not been helpful. He added Latuda, which caused adverse side effects so she stopped. No counseling there, only meds.  She expresses interest in going to [employer], but cannot find the energy to do so.   She denies having trouble sleeping.   Patient Active Problem List   Diagnosis Date Noted  . BMI 36.0-36.9,adult 03/19/2012  . Hematoma 03/19/2012  . Sinusitis 03/02/2012  . HTN (hypertension), benign 01/09/2012  . Osteopenia 01/09/2012  . Migraines 01/09/2012  . Insomnia 01/09/2012  . Anxiety and depression 01/09/2012  . Other and unspecified hyperlipidemia 01/09/2012  . Diabetes mellitus 01/09/2012  . Allergic rhinitis 01/09/2012     Prior to Admission medications   Medication Sig Start Date End Date Taking? Authorizing Provider  citalopram (CELEXA) 40 MG tablet TAKE 1 TABLET BY MOUTH EVERY DAY 10/21/14  Yes Gay Filler Copland, MD  dexlansoprazole (DEXILANT) 60 MG capsule Take 1 capsule (60 mg total) by mouth daily. 01/28/14  Yes Leandrew Koyanagi, MD  fluticasone [facility]) 50 MCG/ACT nasal spray USE 2 SPRAYS IN EACH NOSTRIL ONCE DAILY 12/11/13  Yes Leandrew Koyanagi, MD  gabapentin (NEURONTIN) 300 MG  capsule Take 1 capsule (300 mg total) by mouth 3 (three) times daily. 06/20/14  Yes Leandrew Koyanagi, MD  hydrochlorothiazide (MICROZIDE) 12.5 MG capsule TAKE ONE CAPSULE BY MOUTH DAILY 01/28/14  Yes Leandrew Koyanagi, MD  loratadine (CLARITIN) 10 MG tablet Take 10 mg by mouth daily.   Yes Historical Provider, MD  LORazepam (ATIVAN) 1 MG tablet TAKE 1 TABLET BY MOUTH EVERY 8 HOURS AS NEEDED FOR ANXIETY. 12/24/14  Yes Leandrew Koyanagi, MD  pioglitazone (ACTOS) 30 MG tablet Take 1 tablet (30 mg total) by mouth daily. 08/11/14  Yes Chelle S Jeffery, PA-C    rosuvastatin (CRESTOR) 40 MG tablet TAKE 1 TABLET (40 MG TOTAL) BY MOUTH DAILY. 01/28/14  Yes Leandrew Koyanagi, MD        Review of Systems  Constitutional: Positive for activity change and fatigue. Negative for fever, diaphoresis and appetite change.  HENT: Negative for trouble swallowing.   Eyes: Negative for visual disturbance.  Respiratory: Negative for cough, chest tightness and shortness of breath.   Cardiovascular: Negative for chest pain, palpitations and leg swelling.  Gastrointestinal: Negative for vomiting, abdominal pain and diarrhea.       Variable constipat but no laxatives  Endocrine: Negative for cold intolerance, heat intolerance and polyuria.  Skin: Negative for rash.  Neurological: Negative for dizziness.       HAs have not been a factor recently  Psychiatric/Behavioral: Negative for confusion and sleep disturbance.       Objective:   Physical Exam  Constitutional: She is oriented to person, place, and time. She appears well-developed and well-nourished. No distress.  obese  Eyes: Conjunctivae and EOM are normal. Pupils are equal, round, and reactive to light.  Neck: Neck supple. No thyromegaly present.  Cardiovascular: Normal rate, regular rhythm, normal heart sounds and intact distal pulses.   Pulmonary/Chest: Effort normal and breath sounds normal.  Musculoskeletal: She exhibits no edema.  Neurological: She is alert and oriented to person, place, and time. No cranial nerve deficit.  Psychiatric:  Her mood is definitely dysphoric with and hedonism, loss of motivation, dysthymia. Her behavior is clearly affected by these feelings and her thought content is over focused on the negatives. Her judgment is clouded her inability to make decisions to do things that would improve her situation. There is no suicidal ideation.  Nursing note and vitals reviewed. BP 126/82 mmHg  Pulse 85  Temp(Src) 98.4 F (36.9 C) (Oral)  Resp 18  Ht 5' 5.5" (1.664 m)  Wt 242 lb  6.4 oz (109.952 kg)  BMI 39.71 kg/m2  SpO2 96%  Wt Readings from Last 3 Encounters:  01/27/15 242 lb 6.4 oz (109.952 kg)  12/17/14 238 lb (107.956 kg)  12/07/14 241 lb 4 oz (109.43 kg)  was 225 1 yr ago      Assessment & Plan:  Malaise--- we need to consider organic causes but this is very likely part of depression  Psychosocial stressors--- she has a long history documented in her chart including her paper chart of being pushed into depression and disability from stressful events/there have been several job changes due to this  Abnormal LFTs--- consistent long-term problem thought secondary to hepatic steatosis and she has been unable to mount a campaign for weight loss//continues on 40 mg of Crestor in addition to her medications for diabetes  Anxiety and depression--- she needs consistent aggressive therapy -get restarted with her therapist who is moving Work may be her most positive experience at this point and  she needs to make significant changes at home and with her mother. At this point there is no need to change from Celexa which has really improved her moods  Follow-up in one month   I have completed the patient encounter in its entirety as documented by the scribe, with editing by me where necessary. Robert P. Laney Pastor, M.D.

## 2015-01-28 ENCOUNTER — Telehealth: Payer: Self-pay

## 2015-01-28 NOTE — Telephone Encounter (Signed)
Pt wants to know if her lab work has come back and if she needs to continue vitamin d

## 2015-01-29 NOTE — Telephone Encounter (Signed)
lmom of normal potassium

## 2015-01-29 NOTE — Telephone Encounter (Signed)
Labs look like they have been reviewed.

## 2015-01-30 ENCOUNTER — Other Ambulatory Visit: Payer: Self-pay

## 2015-01-30 NOTE — Telephone Encounter (Signed)
Patient was seen by Dr. Laney Pastor on Tuesday night was told to get folate 50m OTC. She is unable to find folate in 153m all of the OTC folate has a higher mg. She asked the pharmacist who told her in order to get folate in 94m77mhe needed a written prescription. Can she get an rx for 94mg70mlate? Cb[phone] [address]

## 2015-02-01 NOTE — Telephone Encounter (Signed)
Pended Rx for Folic acid 59m /please advise.

## 2015-02-02 MED ORDER — FOLIC ACID 1 MG PO TABS: 1.0000 mg | ORAL_TABLET | Freq: Every day | ORAL | Status: DC

## 2015-02-08 ENCOUNTER — Other Ambulatory Visit: Payer: Self-pay | Admitting: Internal Medicine

## 2015-02-10 NOTE — Telephone Encounter (Signed)
Advise on refill for Gabapentin. Looks like she has not been Rx'd this since 06/2014.

## 2015-02-18 ENCOUNTER — Telehealth: Payer: Self-pay | Admitting: Internal Medicine

## 2015-02-18 MED ORDER — FOLIC ACID 1 MG PO TABS: 1.0000 mg | ORAL_TABLET | Freq: Every day | ORAL | Status: DC

## 2015-02-18 NOTE — Telephone Encounter (Signed)
Rx resent. Pt notified.

## 2015-02-18 NOTE — Telephone Encounter (Signed)
Patient is calling because she was recently sent a 30 day supply of folic acid and states that her insurance prefers that she gets a 90 day supply. She has Natalie Richard. Patient phone: [phone]

## 2015-02-21 ENCOUNTER — Other Ambulatory Visit: Payer: Self-pay | Admitting: Internal Medicine

## 2015-02-25 ENCOUNTER — Telehealth: Payer: Self-pay

## 2015-02-25 NOTE — Telephone Encounter (Signed)
Pt is needing a refill on crestor 90 day supply as name brand

## 2015-02-26 NOTE — Telephone Encounter (Signed)
Fine to refill for 90 days but she needs OV for further refills after that.

## 2015-02-26 NOTE — Telephone Encounter (Signed)
Can we refill for 90 days? Brand name?

## 2015-02-27 ENCOUNTER — Other Ambulatory Visit: Payer: Self-pay | Admitting: Radiology

## 2015-02-27 MED ORDER — ROSUVASTATIN CALCIUM 40 MG PO TABS: ORAL_TABLET | ORAL | Status: DC

## 2015-02-27 NOTE — Telephone Encounter (Signed)
Refilled. Will let pt know.

## 2015-03-14 ENCOUNTER — Other Ambulatory Visit: Payer: Self-pay | Admitting: Internal Medicine

## 2015-03-21 ENCOUNTER — Telehealth: Payer: Self-pay

## 2015-03-21 ENCOUNTER — Other Ambulatory Visit: Payer: Self-pay | Admitting: Internal Medicine

## 2015-03-21 NOTE — Telephone Encounter (Signed)
Last lab 01/06/2015. According to his his last note he wanted to see her in a month.

## 2015-03-21 NOTE — Telephone Encounter (Signed)
PATIENT WOULD LIKE TO KNOW WHEN SHE HAD HER LAB WORK DONE LAST? SHE ALSO WANTS TO KNOW WHEN SHE SHOULD RETURN TO HAVE LABS DONE AGAIN? BEST PHONE [phone] (CELL) [place]

## 2015-03-23 NOTE — Telephone Encounter (Signed)
Lorazepam called into pharmacy.

## 2015-03-25 LAB — HM DIABETES EYE EXAM

## 2015-03-29 ENCOUNTER — Ambulatory Visit (INDEPENDENT_AMBULATORY_CARE_PROVIDER_SITE_OTHER): Payer: BLUE CROSS/BLUE SHIELD | Admitting: Internal Medicine

## 2015-03-29 VITALS — BP 120/78 | HR 80 | Temp 98.5°F | Resp 12 | Ht 66.0 in | Wt 246.0 lb

## 2015-03-29 DIAGNOSIS — F32A Depression, unspecified: Secondary | ICD-10-CM

## 2015-03-29 DIAGNOSIS — F419 Anxiety disorder, unspecified: Secondary | ICD-10-CM

## 2015-03-29 DIAGNOSIS — R7989 Other specified abnormal findings of blood chemistry: Secondary | ICD-10-CM

## 2015-03-29 DIAGNOSIS — R5381 Other malaise: Secondary | ICD-10-CM | POA: Diagnosis not present

## 2015-03-29 DIAGNOSIS — Z6839 Body mass index (BMI) 39.0-39.9, adult: Secondary | ICD-10-CM

## 2015-03-29 DIAGNOSIS — J0100 Acute maxillary sinusitis, unspecified: Secondary | ICD-10-CM | POA: Diagnosis not present

## 2015-03-29 DIAGNOSIS — E785 Hyperlipidemia, unspecified: Secondary | ICD-10-CM

## 2015-03-29 DIAGNOSIS — E7800 Pure hypercholesterolemia, unspecified: Secondary | ICD-10-CM

## 2015-03-29 DIAGNOSIS — E119 Type 2 diabetes mellitus without complications: Secondary | ICD-10-CM | POA: Diagnosis not present

## 2015-03-29 DIAGNOSIS — I10 Essential (primary) hypertension: Secondary | ICD-10-CM

## 2015-03-29 DIAGNOSIS — F418 Other specified anxiety disorders: Secondary | ICD-10-CM | POA: Diagnosis not present

## 2015-03-29 DIAGNOSIS — R945 Abnormal results of liver function studies: Secondary | ICD-10-CM

## 2015-03-29 DIAGNOSIS — F329 Major depressive disorder, single episode, unspecified: Secondary | ICD-10-CM

## 2015-03-29 LAB — POCT CBC
Granulocyte percent: 58.9 %G (ref 37–80)
HEMATOCRIT: 39.5 % (ref 37.7–47.9)
Hemoglobin: 12.5 g/dL (ref 12.2–16.2)
LYMPH, POC: 2.2 (ref 0.6–3.4)
MCH, POC: 25 pg — AB (ref 27–31.2)
MCHC: 31.7 g/dL — AB (ref 31.8–35.4)
MCV: 78.8 fL — AB (ref 80–97)
MID (cbc): 0.4 (ref 0–0.9)
MPV: 6.2 fL (ref 0–99.8)
PLATELET COUNT, POC: 404 10*3/uL (ref 142–424)
POC Granulocyte: 3.8 (ref 2–6.9)
POC LYMPH PERCENT: 34.2 %L (ref 10–50)
POC MID %: 6.9 % (ref 0–12)
RBC: 5.01 M/uL (ref 4.04–5.48)
RDW, POC: 16.9 %
WBC: 6.5 10*3/uL (ref 4.6–10.2)

## 2015-03-29 LAB — COMPREHENSIVE METABOLIC PANEL
ALBUMIN: 3.9 g/dL (ref 3.5–5.2)
ALK PHOS: 118 U/L — AB (ref 39–117)
ALT: 39 U/L — ABNORMAL HIGH (ref 0–35)
AST: 38 U/L — ABNORMAL HIGH (ref 0–37)
BILIRUBIN TOTAL: 0.4 mg/dL (ref 0.2–1.2)
BUN: 11 mg/dL (ref 6–23)
CALCIUM: 9 mg/dL (ref 8.4–10.5)
CHLORIDE: 101 meq/L (ref 96–112)
CO2: 29 meq/L (ref 19–32)
Creat: 0.84 mg/dL (ref 0.50–1.10)
Glucose, Bld: 120 mg/dL — ABNORMAL HIGH (ref 70–99)
POTASSIUM: 3.8 meq/L (ref 3.5–5.3)
Sodium: 139 mEq/L (ref 135–145)
TOTAL PROTEIN: 6.9 g/dL (ref 6.0–8.3)

## 2015-03-29 LAB — LIPID PANEL
Cholesterol: 167 mg/dL (ref 0–200)
HDL: 62 mg/dL (ref >=46)
LDL Cholesterol: 83 mg/dL (ref 0–99)
Total CHOL/HDL Ratio: 2.7 Ratio
Triglycerides: 111 mg/dL (ref <150)
VLDL: 22 mg/dL (ref 0–40)

## 2015-03-29 LAB — POCT GLYCOSYLATED HEMOGLOBIN (HGB A1C): Hemoglobin A1C: 7.4

## 2015-03-29 MED ORDER — AMOXICILLIN 875 MG PO TABS: 875.0000 mg | ORAL_TABLET | Freq: Two times a day (BID) | ORAL | Status: DC

## 2015-03-29 MED ORDER — PIOGLITAZONE HCL 30 MG PO TABS: 30.0000 mg | ORAL_TABLET | Freq: Every day | ORAL | Status: DC

## 2015-03-29 MED ORDER — HYDROCODONE-HOMATROPINE 5-1.5 MG/5ML PO SYRP: 5.0000 mL | ORAL_SOLUTION | Freq: Four times a day (QID) | ORAL | Status: DC | PRN

## 2015-03-29 NOTE — Patient Instructions (Signed)
whole30 (a type of "paleo" diet)

## 2015-03-29 NOTE — Progress Notes (Addendum)
Subjective:    Patient ID: Natalie Richard, female    DOB: 1965/10/21, 49 y.o.   MRN: [id number] This chart was scribed for [name], MD by Zola Button, Medical Scribe. This patient was seen in Room 11 and the patient's care was started at 9:15 AM.   HPI HPI Comments: Natalie Richard is a 49 y.o. female with a hx of anxiety and depression who presents to the [facility] complaining of gradual onset URI symptoms that started a few days ago. Symptoms include sore throat, productive cough of green/yellow sputum, sneezing, otalgia, crusty eye discharge, voice hoarseness, congestion, and rhinorrhea. The cough does keep her up at night. Patient has been taking OTC cold & sinus medication. She also recently changed nasal sprays from Flonase to QNASL--this was done as part of an ENT evaluation for why she has recurrent sinusitis. She has had sick contacts. Patient denies fever. Coughing fits at night  Patient is also here for lab work. Follow-up for diabetes and hyperlipidemia and hypertension. Also for depression and anxiety--she still feels a great deal of stress from her life as noted in past visits although she is a better on Celexa  Patient Active Problem List   Diagnosis Date Noted  . BMI 39.0-39.9,adult 03/19/2012  . Hematoma 03/19/2012  . Sinusitis 03/02/2012  . HTN (hypertension), benign 01/09/2012  . Osteopenia 01/09/2012  . Migraines 01/09/2012  . Insomnia 01/09/2012  . Anxiety and depression 01/09/2012  . Hyperlipidemia 01/09/2012  . Diabetes mellitus 01/09/2012  . Allergic rhinitis 01/09/2012   Now working at [facility] has helped  Review of Systems  Constitutional: Positive for fatigue. Negative for activity change and appetite change.       Gaining weight still Continues with fatigue as in the past  HENT: Positive for postnasal drip, sinus pressure, sneezing, sore throat, trouble swallowing and voice change. Negative for mouth  sores.   Eyes: Positive for discharge. Negative for redness.  Respiratory: Negative for shortness of breath.        Coughing at night in paroxysms requiring cough medicine  Cardiovascular: Negative for chest pain, palpitations and leg swelling.  Gastrointestinal: Negative for abdominal pain.  Genitourinary: Negative for difficulty urinating.  Neurological: Negative for headaches.       Objective:   Physical Exam  Constitutional: She is oriented to person, place, and time. She appears well-developed and well-nourished. No distress.  HENT:  Head: Normocephalic and atraumatic.  Right Ear: External ear normal.  Mouth/Throat: Oropharynx is clear and moist. No oropharyngeal exudate.  Left TM slightly injected Boggy turbinates Purulent discharge Tender maxillary areas  Eyes: EOM are normal. Pupils are equal, round, and reactive to light.  Neck: Normal range of motion. Neck supple. No thyromegaly present.  Cardiovascular: Normal rate, regular rhythm, normal heart sounds and intact distal pulses.   No murmur heard. Pulmonary/Chest: Effort normal and breath sounds normal. No respiratory distress.  Musculoskeletal: Normal range of motion. She exhibits no edema.  Neurological: She is alert and oriented to person, place, and time. No cranial nerve deficit.  Skin: Skin is warm and dry. No rash noted.  Psychiatric: She has a normal mood and affect. Her behavior is normal. Thought content normal.  Vitals reviewed.  BP 120/78 mmHg  Pulse 80  Temp(Src) 98.5 F (36.9 C) (Oral)  Resp 12  Ht 5' 6"  (1.676 m)  Wt 246 lb (111.585 kg)  BMI 39.72 kg/m2  SpO2 98% Wt Readings from Last 3 Encounters:  03/29/15  246 lb (111.585 kg)  01/27/15 242 lb 6.4 oz (109.952 kg)  12/17/14 238 lb (107.956 kg)   Results for orders placed or performed in visit on 03/29/15  POCT glycosylated hemoglobin (Hb A1C)  Result Value Ref Range   Hemoglobin A1C 7.4   POCT CBC  Result Value Ref Range   WBC 6.5 4.6 -  10.2 K/uL   Lymph, poc 2.2 0.6 - 3.4   POC LYMPH PERCENT 34.2 10 - 50 %L   MID (cbc) 0.4 0 - 0.9   POC MID % 6.9 0 - 12 %M   POC Granulocyte 3.8 2 - 6.9   Granulocyte percent 58.9 37 - 80 %G   RBC 5.01 4.04 - 5.48 M/uL   Hemoglobin 12.5 12.2 - 16.2 g/dL   HCT, POC 39.5 37.7 - 47.9 %   MCV 78.8 (A) 80 - 97 fL   MCH, POC 25.0 (A) 27 - 31.2 pg   MCHC 31.7 (A) 31.8 - 35.4 g/dL   RDW, POC 16.9 %   Platelet Count, POC 404 142 - 424 K/uL   MPV 6.2 0 - 99.8 fL          Assessment & Plan:  Type 2 diabetes mellitus without complication--- worsening of hemoglobin A-1 C  BMI 39.0-39.9,adult--another significant weight gain since March  Malaise  Abnormal LFTs -thought to be steatosis  Anxiety and depression--stable for now  Subacute maxillary sinusitis--- another recurrence -Treat with amoxicillin and Hycodan and follow with ENT as planned  HTN (hypertension), benign  Hyperlipidemia   Plan--- she knows that she needs to lose weight but is been unable to make herself do what she needs to do Her diet is well out of control I advise her to return to counseling and have counselor work on her anxiety and depression as well as using CBT techniques to have her adhere to weight loss program She is to start the whole30 diet Recheck in 3 months Meds ordered this encounter  Medications  . Beclomethasone Dipropionate (QNASL) 80 MCG/ACT AERS    Sig: Place 1 spray into both nostrils daily.  Marland Kitchen amoxicillin (AMOXIL) 875 MG tablet    Sig: Take 1 tablet (875 mg total) by mouth 2 (two) times daily.    Dispense:  20 tablet    Refill:  0  . HYDROcodone-homatropine (HYCODAN) 5-1.5 MG/5ML syrup    Sig: Take 5 mLs by mouth every 6 (six) hours as needed.    Dispense:  120 mL    Refill:  0  . pioglitazone (ACTOS) 30 MG tablet    Sig: Take 1 tablet (30 mg total) by mouth daily.    Dispense:  90 tablet    Refill:  1    She may call for other refills until a three-month follow-up   I have  completed the patient encounter in its entirety as documented by the scribe, with editing by me where necessary. Francoise Chojnowski P. Laney Pastor, M.D.

## 2015-03-30 ENCOUNTER — Encounter: Payer: Self-pay | Admitting: Internal Medicine

## 2015-03-31 ENCOUNTER — Telehealth: Payer: Self-pay

## 2015-03-31 NOTE — Telephone Encounter (Signed)
Patient states she called this morning requesting a work note for Monday and today (Tuesday). I didn't see where the message from this morning was sent to clinical. She plans on going back to work on Wednesday and needs note. She was seen by Dr. Laney Pastor. Cb# [phone].

## 2015-04-01 ENCOUNTER — Other Ambulatory Visit: Payer: Self-pay | Admitting: Physician Assistant

## 2015-04-01 NOTE — Telephone Encounter (Signed)
Note ready to pick up.

## 2015-04-01 NOTE — Telephone Encounter (Signed)
Pt called wanting her note extended through Wednesday. She would like to go back on Thursday. Please advise at [phone]

## 2015-04-01 NOTE — Telephone Encounter (Signed)
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

## 2015-04-01 NOTE — Telephone Encounter (Signed)
Okay to extend note?

## 2015-04-02 ENCOUNTER — Telehealth: Payer: Self-pay

## 2015-04-02 NOTE — Telephone Encounter (Signed)
Note faxed.

## 2015-04-02 NOTE — Telephone Encounter (Signed)
Patient is requesting her note be faxed to [facility] -  [phone]

## 2015-04-26 ENCOUNTER — Other Ambulatory Visit: Payer: Self-pay | Admitting: Family Medicine

## 2015-05-13 ENCOUNTER — Other Ambulatory Visit: Payer: Self-pay | Admitting: Internal Medicine

## 2015-05-15 ENCOUNTER — Telehealth: Payer: Self-pay

## 2015-05-15 NOTE — Telephone Encounter (Signed)
Lorazepam rx faxed.

## 2015-05-18 ENCOUNTER — Other Ambulatory Visit: Payer: Self-pay | Admitting: Internal Medicine

## 2015-05-19 NOTE — Telephone Encounter (Signed)
Dr Laney Pastor, you saw pt recently for check up, but don't see this med discussed. OK to RF?

## 2015-05-31 ENCOUNTER — Telehealth: Payer: Self-pay | Admitting: Internal Medicine

## 2015-05-31 NOTE — Telephone Encounter (Signed)
Patient states that she has been feeling tired and sluggish lately. She believes it is due to her diabetes medication Atcos. Can she have an adjustment made to the dosage of this medicine or be put it on something else?  [phone]

## 2015-05-31 NOTE — Telephone Encounter (Signed)
Unusual symptom for actos---stop the med for 1-2 weeks to see if fatigue resolves and csall with results-if goes away we'll do new med

## 2015-06-01 ENCOUNTER — Telehealth: Payer: Self-pay | Admitting: Internal Medicine

## 2015-06-01 NOTE — Telephone Encounter (Signed)
Patient returned call. Could not find clinical staff to assist patient so I gave her the following message from Dr. Laney Pastor:  Natalie Koyanagi, MD at 05/31/2015 11:36 AM     Status: Signed HNO ID: [id number] HNO DAT: [id number]    Note Display Template     No note display template.       Expand All Collapse All   Unusual symptom for actos---stop the med for 1-2 weeks to see if fatigue resolves and csall with results-if goes away we'll do new med          Patient understood. Please call patient back if further follow up is needed.   [phone]

## 2015-06-01 NOTE — Telephone Encounter (Signed)
Left message for pt to call back  °

## 2015-06-08 ENCOUNTER — Other Ambulatory Visit: Payer: Self-pay | Admitting: Internal Medicine

## 2015-06-15 ENCOUNTER — Other Ambulatory Visit: Payer: Self-pay | Admitting: Internal Medicine

## 2015-06-18 ENCOUNTER — Other Ambulatory Visit: Payer: Self-pay | Admitting: Internal Medicine

## 2015-06-20 ENCOUNTER — Telehealth: Payer: Self-pay | Admitting: *Deleted

## 2015-06-20 ENCOUNTER — Telehealth: Payer: Self-pay

## 2015-06-20 MED ORDER — ROSUVASTATIN CALCIUM 40 MG PO TABS: ORAL_TABLET | ORAL | Status: DC

## 2015-06-20 NOTE — Telephone Encounter (Signed)
Rx phoned in for Lorazepam & pt notified via voicemail

## 2015-06-20 NOTE — Telephone Encounter (Signed)
Pt states that she went to p/u her crestor an only a qty of 30 were called in, pt stated that her ins will only cover a 90 day supply.   Best# [place]

## 2015-06-20 NOTE — Telephone Encounter (Signed)
90 day supply sent to pharmacy since she was just seen in June & had lipid labs drawn. Was also okayed for refill by Ebony Hail.

## 2015-07-26 ENCOUNTER — Ambulatory Visit (INDEPENDENT_AMBULATORY_CARE_PROVIDER_SITE_OTHER): Payer: BLUE CROSS/BLUE SHIELD | Admitting: Internal Medicine

## 2015-07-26 VITALS — BP 126/92 | HR 92 | Temp 98.3°F | Resp 18 | Ht 66.0 in | Wt 247.0 lb

## 2015-07-26 DIAGNOSIS — K7581 Nonalcoholic steatohepatitis (NASH): Secondary | ICD-10-CM

## 2015-07-26 DIAGNOSIS — R7989 Other specified abnormal findings of blood chemistry: Secondary | ICD-10-CM | POA: Diagnosis not present

## 2015-07-26 DIAGNOSIS — E785 Hyperlipidemia, unspecified: Secondary | ICD-10-CM

## 2015-07-26 DIAGNOSIS — E1165 Type 2 diabetes mellitus with hyperglycemia: Secondary | ICD-10-CM

## 2015-07-26 DIAGNOSIS — R945 Abnormal results of liver function studies: Secondary | ICD-10-CM

## 2015-07-26 DIAGNOSIS — F419 Anxiety disorder, unspecified: Secondary | ICD-10-CM

## 2015-07-26 DIAGNOSIS — Z6839 Body mass index (BMI) 39.0-39.9, adult: Secondary | ICD-10-CM | POA: Diagnosis not present

## 2015-07-26 DIAGNOSIS — I10 Essential (primary) hypertension: Secondary | ICD-10-CM | POA: Diagnosis not present

## 2015-07-26 DIAGNOSIS — F418 Other specified anxiety disorders: Secondary | ICD-10-CM

## 2015-07-26 DIAGNOSIS — F329 Major depressive disorder, single episode, unspecified: Secondary | ICD-10-CM

## 2015-07-26 DIAGNOSIS — T148XXA Other injury of unspecified body region, initial encounter: Secondary | ICD-10-CM

## 2015-07-26 DIAGNOSIS — F32A Depression, unspecified: Secondary | ICD-10-CM

## 2015-07-26 LAB — COMPREHENSIVE METABOLIC PANEL
ALK PHOS: 123 U/L (ref 33–130)
ALT: 66 U/L — ABNORMAL HIGH (ref 6–29)
AST: 53 U/L — AB (ref 10–35)
Albumin: 3.9 g/dL (ref 3.6–5.1)
BUN: 10 mg/dL (ref 7–25)
CALCIUM: 9.1 mg/dL (ref 8.6–10.4)
CO2: 27 mmol/L (ref 20–31)
Chloride: 100 mmol/L (ref 98–110)
Creat: 0.81 mg/dL (ref 0.50–1.05)
Glucose, Bld: 122 mg/dL — ABNORMAL HIGH (ref 65–99)
Potassium: 3.5 mmol/L (ref 3.5–5.3)
SODIUM: 139 mmol/L (ref 135–146)
Total Bilirubin: 0.4 mg/dL (ref 0.2–1.2)
Total Protein: 6.9 g/dL (ref 6.1–8.1)

## 2015-07-26 LAB — HEMOGLOBIN A1C: HEMOGLOBIN A1C: 7.5 % — AB (ref 4.0–6.0)

## 2015-07-26 LAB — POCT GLYCOSYLATED HEMOGLOBIN (HGB A1C): Hemoglobin A1C: 7.5

## 2015-07-26 MED ORDER — PIOGLITAZONE HCL 30 MG PO TABS: 30.0000 mg | ORAL_TABLET | Freq: Every day | ORAL | Status: DC

## 2015-07-26 MED ORDER — ROSUVASTATIN CALCIUM 40 MG PO TABS
ORAL_TABLET | ORAL | Status: DC
Start: 2015-07-26 — End: 2016-01-10

## 2015-07-26 MED ORDER — CITALOPRAM HYDROBROMIDE 40 MG PO TABS: 40.0000 mg | ORAL_TABLET | Freq: Every day | ORAL | Status: DC

## 2015-07-26 NOTE — Progress Notes (Addendum)
Subjective:  This chart was scribed for [name], MD by Thea Alken, ED Scribe. This patient was seen in room 1 and the patient's care was started at 8:37 AM.   Patient ID: Natalie Richard, female    DOB: January 09, 1966, 49 y.o.   MRN: [id number]  HPI   Chief Complaint  Patient presents with  . Follow-up    lab work, consult   HPI Comments: Natalie Richard is a 48 y.o. Female with hx of DM who presents to the [facility] for consultation. Pt went to weight watchers meeting, but was uninterested. She has been trying to drink more water and change her diet, but has been overwhelmed and loses focus. She will often try to lose weight but then stops. She has had difficulty being seen by her therapist, Charisse Klinefelter. She has been having stressors including family stress, her mother being in a nursing home, and home stress, remodeling her rental home. She feels as if she doesn't have help. There are times where she feels like she can't get out of bed. She feels like she needs to go away for a while and work on herself. Her co-worker, who is also trying to lose weight, is on phentermine. She has been on Wellbutrin and Effexor in the past without results. She takes ativan 2 times a day which is helpful for anxiety.  Last blood work was 03/2015. Pt has not been on Actos in last week--unsure what it does. She was initially switched to Actos after intolerance to metformin. She feels as if Actos does not do anything for her.   She is needing refills of Celexa. On gabapentin started by psychiatric ? When/prob 1 yr ago 300bid--?no effect   Long history of problems with her husband doing nothing to help her causing great frustration. Long history of difficulties dealing with caretaking mother who is now a nursing facility. She is having trouble attending to all the details of her personal life and her mother's affairs. Cannot manage to exercise. Cannot manage to follow a diet.  He Past Medical History  Diagnosis Date  . Cancer (HCC)     skin- Right shin squamous cell  . Diabetes mellitus   . GERD (gastroesophageal reflux disease)   . Hyperlipidemia   . Hypertension   . Hematoma     on buttock  . Migraine   . Seasonal allergies   . Depression   . Anxiety    Allergies  Allergen Reactions  . Buspar [Buspirone] Other (See Comments)    Lip swelling and rash numbness   . Augmentin [Amoxicillin-Pot Clavulanate] Nausea And Vomiting  . Biaxin [Clarithromycin]   . Ciprofloxacin     Developed itching with IV form in the hospital, but has since taken PO without a problem  . Doxycycline Itching  . Metformin And Related Nausea And Vomiting  . Pyridium [Phenazopyridine Hcl]     Does not remember reaction  . Sulfa Antibiotics     As a younger person had itching, but has taken more recently and did ok  . Lorabid [Loracarbef] Rash   Prior to Admission medications   Medication Sig Start Date End Date Taking? Authorizing Provider  citalopram (CELEXA) 40 MG tablet TAKE 1 TABLET BY MOUTH EVERY DAY 04/27/15  Yes Jessica C Copland, MD  DEXILANT 60 MG capsule TAKE 1 CAPSULE (60 MG TOTAL) BY MOUTH DAILY. 05/19/15  Yes Leandrew Koyanagi, MD  fluticasone (FLONASE) 50 MCG/ACT nasal spray USE 2  SPRAYS IN EACH NOSTRIL ONCE DAILY 12/11/13  Yes Leandrew Koyanagi, MD  folic acid (FOLVITE) 1 MG tablet Take 1 tablet (1 mg total) by mouth daily. 02/18/15  Yes Leandrew Koyanagi, MD  gabapentin (NEURONTIN) 300 MG capsule TAKE 1 CAPSULE (300 MG TOTAL) BY MOUTH 3 (THREE) TIMES DAILY. 02/10/15  Yes Leandrew Koyanagi, MD  hydrochlorothiazide (MICROZIDE) 12.5 MG capsule TAKE ONE CAPSULE BY MOUTH DAILY 06/16/15  Yes Mancel Bale, PA-C  loratadine (CLARITIN) 10 MG tablet Take 10 mg by mouth daily.   Yes Historical Provider, MD  LORazepam (ATIVAN) 1 MG tablet TAKE 1 TABLET EVERY 8 HOURS AS NEEDED FOR ANXIETY 06/20/15  Yes Leandrew Koyanagi, MD  rosuvastatin (CRESTOR) 40 MG tablet TAKE 1 TABLET  (40 MG TOTAL) BY MOUTH DAILY.  "OV NEEDED FOR FURTHER REFILLS" 2ND 06/20/15  Yes Leandrew Koyanagi, MD  Beclomethasone Dipropionate (QNASL) 80 MCG/ACT AERS Place 1 spray into both nostrils daily.    Historical Provider, MD  HYDROcodone-homatropine (HYCODAN) 5-1.5 MG/5ML syrup Take 5 mLs by mouth every 6 (six) hours as needed. Patient not taking: Reported on 07/26/2015 03/29/15   Leandrew Koyanagi, MD  pioglitazone (ACTOS) 30 MG tablet Take 1 tablet (30 mg total) by mouth daily. Patient not taking: Reported on 07/26/2015 03/29/15   Leandrew Koyanagi, MD   Review of Systems  Objective:   Physical Exam  Constitutional: She is oriented to person, place, and time. She appears well-developed and well-nourished. No distress.  obese  HENT:  Head: Normocephalic and atraumatic.  Eyes: Conjunctivae and EOM are normal.  Neck: Neck supple.  Cardiovascular: Normal rate.   Pulmonary/Chest: Effort normal.  Musculoskeletal: Normal range of motion.  Neurological: She is alert and oriented to person, place, and time.  Skin: Skin is warm and dry.  Psychiatric: She has a normal mood and affect. Her behavior is normal.  Nursing note and vitals reviewed.   Filed Vitals:   07/26/15 0817  BP: 126/92  Pulse: 92  Temp: 98.3 F (36.8 C)  Resp: 18  Height: 5' 6"  (1.676 m)  Weight: 247 lb (112.038 kg)  SpO2: 98%   Assessment & Plan:  Type 2 diabetes mellitus with hyperglycemia, without long-term current use of insulin (HCC) - Plan: POCT glycosylated hemoglobin (Hb A1C)=7.5  Hyperlipidemia  HTN (hypertension), benign - Plan: Comprehensive metabolic panel  BMI 16.1-09.6,EAVWU  Anxiety and depression  Abnormal LFTs - Plan: Comprehensive metabolic panel  Meds ordered this encounter  Medications  . rosuvastatin (CRESTOR) 40 MG tablet    Sig: TAKE 1 TABLET (40 MG TOTAL) BY MOUTH DAILY.    Dispense:  90 tablet    Refill:  1  . citalopram (CELEXA) 40 MG tablet    Sig: Take 1 tablet (40 mg total)  by mouth daily.    Dispense:  90 tablet    Refill:  3  . pioglitazone (ACTOS) 30 MG tablet    Sig: Take 1 tablet (30 mg total) by mouth daily.    Dispense:  90 tablet    Refill:  1   D/c neurontin  This is the same life pattern and has been present throughout the decade she has been my patient. She has failed to respond to at least 3 different therapist but complains that they are passive approach. She is never been able to get involved in group dynamics which would help greatly. She fails  everything she starts and then gives herself a hard time being a failure. It  seems that the missing ingredient is a therapist that can motivate her to lose weight, and to better address the stress factors in her life that are creating her current depression and and hedonism/lack of motivation. I have suggested a new therapist L Atkinson. I do not believe that further medication will be helpful.     I have completed the patient encounter in its entirety as documented by the scribe, with editing by me where necessary. Macari Zalesky P. Laney Pastor, M.D.

## 2015-07-26 NOTE — Patient Instructions (Signed)
Herby Abraham [phone]

## 2015-07-27 ENCOUNTER — Encounter: Payer: Self-pay | Admitting: Internal Medicine

## 2015-07-31 ENCOUNTER — Other Ambulatory Visit: Payer: Self-pay | Admitting: Family Medicine

## 2015-07-31 ENCOUNTER — Encounter: Payer: Self-pay | Admitting: Family Medicine

## 2015-07-31 ENCOUNTER — Telehealth: Payer: Self-pay

## 2015-07-31 NOTE — Telephone Encounter (Signed)
That should resolve quickly Could use half dose for a week if prefers

## 2015-07-31 NOTE — Telephone Encounter (Signed)
Left message on machine to call back. Why did she stop Gabapentin?

## 2015-07-31 NOTE — Telephone Encounter (Signed)
Dr. Laney Pastor. Please advise. Thanks

## 2015-07-31 NOTE — Telephone Encounter (Signed)
Patient has been off of gabapentin since Sunday and has been feeling dizziness. Please advise! [phone]

## 2015-08-02 NOTE — Telephone Encounter (Signed)
Pt called in for Rx advice from 10/28... Relayed message.  [phone]

## 2015-08-12 ENCOUNTER — Other Ambulatory Visit: Payer: Self-pay | Admitting: Internal Medicine

## 2015-08-16 ENCOUNTER — Other Ambulatory Visit: Payer: Self-pay | Admitting: Internal Medicine

## 2015-08-31 ENCOUNTER — Encounter: Payer: Self-pay | Admitting: Internal Medicine

## 2015-09-12 ENCOUNTER — Ambulatory Visit (INDEPENDENT_AMBULATORY_CARE_PROVIDER_SITE_OTHER): Payer: BLUE CROSS/BLUE SHIELD | Admitting: Internal Medicine

## 2015-09-12 VITALS — BP 120/80 | HR 87 | Temp 97.7°F | Resp 16 | Ht 66.0 in | Wt 245.0 lb

## 2015-09-12 DIAGNOSIS — J0100 Acute maxillary sinusitis, unspecified: Secondary | ICD-10-CM | POA: Diagnosis not present

## 2015-09-12 MED ORDER — AMOXICILLIN 875 MG PO TABS: 875.0000 mg | ORAL_TABLET | Freq: Two times a day (BID) | ORAL | Status: DC
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
## 2015-09-14 ENCOUNTER — Other Ambulatory Visit: Payer: Self-pay | Admitting: Physician Assistant

## 2015-09-25 ENCOUNTER — Other Ambulatory Visit: Payer: Self-pay | Admitting: Internal Medicine

## 2015-09-25 ENCOUNTER — Telehealth: Payer: Self-pay

## 2015-09-25 NOTE — Telephone Encounter (Signed)
Pt was seen 12/10 by Dr. Laney Pastor for Subacute maxillary sinusitis - Primary. She is complaining of a cough. She would like be prescribed a cough syrup for this. I advised her to come in, she said Dr. Laney Pastor may fill it for her. Pharmacy is [id number] [place] [phone]

## 2015-09-28 NOTE — Telephone Encounter (Signed)
Faxed

## 2015-11-13 ENCOUNTER — Other Ambulatory Visit: Payer: Self-pay | Admitting: Internal Medicine

## 2015-11-16 NOTE — Telephone Encounter (Signed)
Called in.

## 2015-11-24 ENCOUNTER — Other Ambulatory Visit: Payer: Self-pay | Admitting: Internal Medicine

## 2015-12-27 ENCOUNTER — Other Ambulatory Visit: Payer: Self-pay | Admitting: Internal Medicine

## 2015-12-29 NOTE — Telephone Encounter (Signed)
Faxed

## 2015-12-31 ENCOUNTER — Other Ambulatory Visit: Payer: Self-pay | Admitting: Internal Medicine

## 2016-01-10 ENCOUNTER — Ambulatory Visit (INDEPENDENT_AMBULATORY_CARE_PROVIDER_SITE_OTHER): Payer: BLUE CROSS/BLUE SHIELD | Admitting: Internal Medicine

## 2016-01-10 VITALS — BP 120/76 | HR 84 | Temp 98.1°F | Resp 16 | Ht 66.0 in | Wt 247.5 lb

## 2016-01-10 DIAGNOSIS — G4700 Insomnia, unspecified: Secondary | ICD-10-CM

## 2016-01-10 DIAGNOSIS — F418 Other specified anxiety disorders: Secondary | ICD-10-CM | POA: Diagnosis not present

## 2016-01-10 DIAGNOSIS — J302 Other seasonal allergic rhinitis: Secondary | ICD-10-CM

## 2016-01-10 DIAGNOSIS — J019 Acute sinusitis, unspecified: Secondary | ICD-10-CM | POA: Diagnosis not present

## 2016-01-10 DIAGNOSIS — F419 Anxiety disorder, unspecified: Secondary | ICD-10-CM

## 2016-01-10 DIAGNOSIS — E785 Hyperlipidemia, unspecified: Secondary | ICD-10-CM | POA: Diagnosis not present

## 2016-01-10 DIAGNOSIS — E1165 Type 2 diabetes mellitus with hyperglycemia: Secondary | ICD-10-CM

## 2016-01-10 DIAGNOSIS — F329 Major depressive disorder, single episode, unspecified: Secondary | ICD-10-CM

## 2016-01-10 DIAGNOSIS — I10 Essential (primary) hypertension: Secondary | ICD-10-CM | POA: Diagnosis not present

## 2016-01-10 DIAGNOSIS — R0789 Other chest pain: Secondary | ICD-10-CM

## 2016-01-10 DIAGNOSIS — Z6839 Body mass index (BMI) 39.0-39.9, adult: Secondary | ICD-10-CM | POA: Diagnosis not present

## 2016-01-10 DIAGNOSIS — R42 Dizziness and giddiness: Secondary | ICD-10-CM | POA: Diagnosis not present

## 2016-01-10 DIAGNOSIS — K7581 Nonalcoholic steatohepatitis (NASH): Secondary | ICD-10-CM

## 2016-01-10 DIAGNOSIS — F32A Depression, unspecified: Secondary | ICD-10-CM

## 2016-01-10 LAB — CBC WITH DIFFERENTIAL/PLATELET
BASOS PCT: 0 %
Basophils Absolute: 0 cells/uL (ref 0–200)
EOS ABS: 134 {cells}/uL (ref 15–500)
EOS PCT: 2 %
HCT: 39.2 % (ref 35.0–45.0)
Hemoglobin: 12.8 g/dL (ref 11.7–15.5)
Lymphocytes Relative: 38 %
Lymphs Abs: 2546 cells/uL (ref 850–3900)
MCH: 26 pg — ABNORMAL LOW (ref 27.0–33.0)
MCHC: 32.7 g/dL (ref 32.0–36.0)
MCV: 79.7 fL — AB (ref 80.0–100.0)
MONOS PCT: 8 %
MPV: 8.9 fL (ref 7.5–12.5)
Monocytes Absolute: 536 cells/uL (ref 200–950)
NEUTROS ABS: 3484 {cells}/uL (ref 1500–7800)
Neutrophils Relative %: 52 %
PLATELETS: 414 10*3/uL — AB (ref 140–400)
RBC: 4.92 MIL/uL (ref 3.80–5.10)
RDW: 16.4 % — AB (ref 11.0–15.0)
WBC: 6.7 10*3/uL (ref 3.8–10.8)

## 2016-01-10 LAB — LIPID PANEL
Cholesterol: 193 mg/dL (ref 125–200)
HDL: 56 mg/dL (ref >=46)
LDL CALC: 98 mg/dL (ref <130)
Total CHOL/HDL Ratio: 3.4 Ratio (ref <=5.0)
Triglycerides: 195 mg/dL — ABNORMAL HIGH (ref <150)
VLDL: 39 mg/dL — ABNORMAL HIGH (ref <30)

## 2016-01-10 LAB — TSH: TSH: 1.74 m[IU]/L

## 2016-01-10 LAB — COMPREHENSIVE METABOLIC PANEL
ALK PHOS: 115 U/L (ref 33–130)
ALT: 75 U/L — ABNORMAL HIGH (ref 6–29)
AST: 56 U/L — AB (ref 10–35)
Albumin: 4 g/dL (ref 3.6–5.1)
BUN: 10 mg/dL (ref 7–25)
CO2: 26 mmol/L (ref 20–31)
CREATININE: 0.75 mg/dL (ref 0.50–1.05)
Calcium: 8.8 mg/dL (ref 8.6–10.4)
Chloride: 99 mmol/L (ref 98–110)
GLUCOSE: 139 mg/dL — AB (ref 65–99)
Potassium: 3.6 mmol/L (ref 3.5–5.3)
SODIUM: 137 mmol/L (ref 135–146)
TOTAL PROTEIN: 6.8 g/dL (ref 6.1–8.1)
Total Bilirubin: 0.3 mg/dL (ref 0.2–1.2)

## 2016-01-10 LAB — MICROALBUMIN, URINE

## 2016-01-10 LAB — POCT GLYCOSYLATED HEMOGLOBIN (HGB A1C): HEMOGLOBIN A1C: 7.7

## 2016-01-10 MED ORDER — HYDROCHLOROTHIAZIDE 12.5 MG PO CAPS: 12.5000 mg | ORAL_CAPSULE | Freq: Every day | ORAL | Status: DC

## 2016-01-10 MED ORDER — AMOXICILLIN 500 MG PO CAPS: 1000.0000 mg | ORAL_CAPSULE | Freq: Two times a day (BID) | ORAL | Status: AC

## 2016-01-10 MED ORDER — ROSUVASTATIN CALCIUM 40 MG PO TABS: ORAL_TABLET | ORAL | Status: DC

## 2016-01-10 MED ORDER — BENZONATATE 100 MG PO CAPS: 100.0000 mg | ORAL_CAPSULE | Freq: Two times a day (BID) | ORAL | Status: DC | PRN

## 2016-01-10 MED ORDER — DEXLANSOPRAZOLE 60 MG PO CPDR: 1.0000 | DELAYED_RELEASE_CAPSULE | Freq: Every day | ORAL | Status: DC

## 2016-01-10 MED ORDER — HYDROCODONE-HOMATROPINE 5-1.5 MG/5ML PO SYRP: 5.0000 mL | ORAL_SOLUTION | Freq: Four times a day (QID) | ORAL | Status: DC | PRN

## 2016-01-10 MED ORDER — PIOGLITAZONE HCL 30 MG PO TABS: 30.0000 mg | ORAL_TABLET | Freq: Every day | ORAL | Status: DC

## 2016-01-10 MED ORDER — LORAZEPAM 1 MG PO TABS: 1.0000 mg | ORAL_TABLET | Freq: Three times a day (TID) | ORAL | Status: DC | PRN

## 2016-01-10 NOTE — Progress Notes (Addendum)
Subjective:  By signing my name below, I, Moises Blood, attest that this documentation has been prepared under the direction and in the presence of [name], MD. Electronically Signed: Moises Blood, [place]. 01/10/2016 , 9:35 AM .  Patient was seen in Room 11 .   Patient ID: Natalie Richard, female    DOB: Aug 14, 1966, 50 y.o.   MRN: [id number] Chief Complaint  Patient presents with  . Panic Attack    grieving the loss of her mother.  she feels overwhelmed with things and having some chest discomfort.    . Headache    cough with congestion, ear pain, PND   HPI Natalie Richard is a 50 y.o. female who presents to [facility] complaining of chest discomfort due to grieving the loss of her mother. Her mother (24 years old) passed away in 11-10-15 due to pneumonia. She denies leg swelling and loss of sensation in her lower extremities.   Medications She sometimes takes ativan to help with sleep if she had a busy day.  She was taking celexa initially due to panic attack which occurred in 2011. She hasn't had any recurrent episodes.   She's also been having a headache and cough with congestion, ear pain and postnasal drip. She has trouble breathing through her nose at night. She would blow her nose in the morning with crusty, yellow mucus. She was seen by ENT (Dr. Meredith Leeds) a month ago and was informed she has trouble draining her sinuses. She was also prescribed 3 antibiotics; first one was levaquin and it caused welts to appear. She also reports her ears popping occasionally. She's taken a cough drop last night for some relief.   She's started walking more often recently. She also has shortness of breath with activity. She had a stress test done several years ago. She also reports feeling dizzy while in office with slight short of breath and palpitations.   Patient Active Problem List   Diagnosis Date Noted  . NASH (nonalcoholic steatohepatitis) 07/26/2015  . BMI 39.0-39.9,adult 03/19/2012    . Hematoma 03/19/2012  . Sinusitis 03/02/2012  . HTN (hypertension), benign 01/09/2012  . Osteopenia 01/09/2012  . Migraines 01/09/2012  . Insomnia 01/09/2012  . Anxiety and depression 01/09/2012  . Hyperlipidemia 01/09/2012  . Diabetes mellitus 01/09/2012  . Allergic rhinitis 01/09/2012    Current outpatient prescriptions:  .  Beclomethasone Dipropionate (QNASL) 80 MCG/ACT AERS, Place 1 spray into both nostrils daily., Disp: , Rfl:  .  citalopram (CELEXA) 40 MG tablet, Take 1 tablet (40 mg total) by mouth daily., Disp: 90 tablet, Rfl: 3 .  dexlansoprazole (DEXILANT) 60 MG capsule, Take 1 capsule (60 mg total) by mouth daily., Disp: 90 capsule, Rfl: 3 .  folic acid (FOLVITE) 1 MG tablet, Take 1 tablet (1 mg total) by mouth daily., Disp: 90 tablet, Rfl: 3 .  hydrochlorothiazide (MICROZIDE) 12.5 MG capsule, Take 1 capsule (12.5 mg total) by mouth daily., Disp: 90 capsule, Rfl: 1 .  loratadine (CLARITIN) 10 MG tablet, Take 10 mg by mouth daily., Disp: , Rfl:  .  LORazepam (ATIVAN) 1 MG tablet, Take 1 tablet (1 mg total) by mouth every 8 (eight) hours as needed. for anxiety, Disp: 90 tablet, Rfl: 5 .  pioglitazone (ACTOS) 30 MG tablet, Take 1 tablet (30 mg total) by mouth daily., Disp: 90 tablet, Rfl: 1 .  rosuvastatin (CRESTOR) 40 MG tablet, TAKE 1 TABLET (40 MG TOTAL) BY MOUTH DAILY., Disp: 90 tablet, Rfl: 1  Review of Systems  Constitutional: Negative for fever, chills and fatigue.  HENT: Positive for congestion, ear pain and postnasal drip.   Respiratory: Positive for cough, chest tightness and shortness of breath.   Cardiovascular: Positive for palpitations. Negative for leg swelling.  Neurological: Positive for dizziness, light-headedness and headaches. Negative for weakness and numbness.       Objective:   Physical Exam  Constitutional: She is oriented to person, place, and time. She appears well-developed and well-nourished. No distress.  HENT:  Head: Normocephalic and  atraumatic.  Right Ear: Tympanic membrane normal.  Left Ear: Tympanic membrane normal.  Mouth/Throat: Oropharynx is clear and moist.  Boggy nares  Eyes: EOM are normal. Pupils are equal, round, and reactive to light.  Neck: Neck supple.  Cardiovascular: Normal rate, regular rhythm and normal heart sounds.   No murmur heard. Pulmonary/Chest: Effort normal and breath sounds normal. No respiratory distress. She has no wheezes.  Tender along the left sternal border at the [facility] joint  Musculoskeletal: Normal range of motion.  Neurological: She is alert and oriented to person, place, and time.  Skin: Skin is warm and dry.  Psychiatric: She has a normal mood and affect. Her behavior is normal.  Nursing note and vitals reviewed.   BP 120/76 mmHg  Pulse 84  Temp(Src) 98.1 F (36.7 C) (Oral)  Resp 16  Ht 5' 6"  (1.676 m)  Wt 247 lb 8 oz (112.265 kg)  BMI 39.97 kg/m2  SpO2 97%   Results for orders placed or performed in visit on 01/10/16  POCT glycosylated hemoglobin (Hb A1C)  Result Value Ref Range   Hemoglobin A1C 7.7    EKG: non specific ST T-wave changes throughout, left axis deviation, very similar to EKG in 2014    Assessment & Plan:  I have completed the patient encounter in its entirety as documented by the scribe, with editing by me where necessary. Robert P. Laney Pastor, M.D. HTN (hypertension), benign - Plan: CBC with Differential/Platelet, Comprehensive metabolic panel, EKG 82-XHBZ  Insomnia  Anxiety and depression  Hyperlipidemia - Plan: Lipid panel  Type 2 diabetes mellitus with hyperglycemia, without long-term current use of insulin ([place]) - Plan: POCT glycosylated hemoglobin (Hb A1C), Microalbumin, urine  Other seasonal allergic rhinitis  BMI 39.0-39.9,adult - Plan: TSH  NASH (nonalcoholic steatohepatitis) - Plan: Comprehensive metabolic panel  Other chest pain - Plan: EKG 12-Lead  Dizziness and giddiness  Acute sinusitis, unspecified  Meds ordered this  encounter  Medications  . dexlansoprazole (DEXILANT) 60 MG capsule    Sig: Take 1 capsule (60 mg total) by mouth daily.    Dispense:  90 capsule    Refill:  3  . hydrochlorothiazide (MICROZIDE) 12.5 MG capsule    Sig: Take 1 capsule (12.5 mg total) by mouth daily.    Dispense:  90 capsule    Refill:  1  . LORazepam (ATIVAN) 1 MG tablet    Sig: Take 1 tablet (1 mg total) by mouth every 8 (eight) hours as needed. for anxiety    Dispense:  90 tablet    Refill:  5  . pioglitazone (ACTOS) 30 MG tablet    Sig: Take 1 tablet (30 mg total) by mouth daily.    Dispense:  90 tablet    Refill:  1    No more refills without office visit  . rosuvastatin (CRESTOR) 40 MG tablet    Sig: TAKE 1 TABLET (40 MG TOTAL) BY MOUTH DAILY.    Dispense:  90 tablet    Refill:  1  . amoxicillin (AMOXIL) 500 MG capsule    Sig: Take 2 capsules (1,000 mg total) by mouth 2 (two) times daily.    Dispense:  40 capsule    Refill:  0  . HYDROcodone-homatropine (HYCODAN) 5-1.5 MG/5ML syrup    Sig: Take 5 mLs by mouth every 6 (six) hours as needed.    Dispense:  120 mL    Refill:  0  . benzonatate (TESSALON) 100 MG capsule    Sig: Take 1 capsule (100 mg total) by mouth 2 (two) times daily as needed for cough.    Dispense:  20 capsule    Refill:  0   Counseling options discussed Mail list of possible new PCP's with labs  50 min ov

## 2016-01-10 NOTE — Patient Instructions (Signed)
     IF you received an x-ray today, you will receive an invoice from [facility]. Please contact [facility] at [phone] with questions or concerns regarding your invoice.   IF you received labwork today, you will receive an invoice from [facility]. Please contact [facility] at [phone] with questions or concerns regarding your invoice.   Our billing staff will not be able to assist you with questions regarding bills from these companies.  You will be contacted with the lab results as soon as they are available. The fastest way to get your results is to activate your My Chart account. Instructions are located on the last page of this paperwork. If you have not heard from us regarding the results in 2 weeks, please contact this office.      

## 2016-01-19 ENCOUNTER — Encounter: Payer: Self-pay | Admitting: Internal Medicine

## 2016-01-20 ENCOUNTER — Other Ambulatory Visit: Payer: Self-pay

## 2016-01-20 NOTE — Telephone Encounter (Signed)
Pt states she isn't sure of all the medicines she need but would like Dr Laney Pastor to go through and give her refills on them all so she won't have any problems getting refills when he's gone Please call [place]

## 2016-01-25 NOTE — Telephone Encounter (Signed)
I have gone through pt's current med list at last OV and it looks like she should have at least 6 mos of RFs on everything except QNASL and claritin. Dr Laney Pastor, I don't see that we have Rxd these for pt before. Do you want to? Pended.

## 2016-01-26 MED ORDER — LORATADINE 10 MG PO TABS: 10.0000 mg | ORAL_TABLET | Freq: Every day | ORAL | Status: DC

## 2016-01-26 MED ORDER — BECLOMETHASONE DIPROPIONATE 80 MCG/ACT NA AERS: 1.0000 | INHALATION_SPRAY | Freq: Every day | NASAL | Status: DC

## 2016-02-13 ENCOUNTER — Other Ambulatory Visit: Payer: Self-pay | Admitting: Internal Medicine

## 2016-02-25 ENCOUNTER — Telehealth: Payer: Self-pay

## 2016-02-25 NOTE — Telephone Encounter (Signed)
Can a note be written?

## 2016-02-25 NOTE — Telephone Encounter (Signed)
Patient called in and stated that she needs a doctors note telling her employer that she needs to be able to have a drink at her workstation since she is a diabetic. She is a Engineer, maintenance (IT) patient.  Her call back number is [phone]

## 2016-02-25 NOTE — Telephone Encounter (Signed)
Letter written and routed to the Clinical Message Pool.

## 2016-02-26 NOTE — Telephone Encounter (Signed)
LM informing patient letter is ready for pick up

## 2016-03-09 ENCOUNTER — Telehealth: Payer: Self-pay

## 2016-03-09 NOTE — Telephone Encounter (Signed)
Pt is looking for [place] to write a work note for today to be out due to a migraine she has not been in to be seen for this recently  Best number [phone]

## 2016-03-10 NOTE — Telephone Encounter (Signed)
Can we do this ?

## 2016-03-11 NOTE — Telephone Encounter (Signed)
Left message for pt to call back  °

## 2016-03-11 NOTE — Telephone Encounter (Signed)
We can give note saying she has migraines but can't vouch for any specific day--they have to believe her or she has to be seen or set up [facility]

## 2016-03-19 ENCOUNTER — Telehealth: Payer: Self-pay

## 2016-03-19 NOTE — Telephone Encounter (Signed)
Pt returned Natalie Richard's call on June 9, but no one documented it.  Please call back about previous call asap.

## 2016-03-23 NOTE — Telephone Encounter (Signed)
Left message for pt to call back  °

## 2016-04-08 ENCOUNTER — Ambulatory Visit (INDEPENDENT_AMBULATORY_CARE_PROVIDER_SITE_OTHER): Payer: BLUE CROSS/BLUE SHIELD | Admitting: Physician Assistant

## 2016-04-08 VITALS — BP 120/80 | HR 89 | Temp 99.1°F | Resp 16 | Ht 66.0 in | Wt 244.2 lb

## 2016-04-08 DIAGNOSIS — F3289 Other specified depressive episodes: Secondary | ICD-10-CM | POA: Diagnosis not present

## 2016-04-08 DIAGNOSIS — Z0000 Encounter for general adult medical examination without abnormal findings: Secondary | ICD-10-CM

## 2016-04-08 DIAGNOSIS — E119 Type 2 diabetes mellitus without complications: Secondary | ICD-10-CM

## 2016-04-08 LAB — POCT GLYCOSYLATED HEMOGLOBIN (HGB A1C): Hemoglobin A1C: 7.7

## 2016-04-08 NOTE — Patient Instructions (Addendum)
Stop taking the Gabapentin.   Stop taking the Ativan in the morning and afternoon.   Exercise by walking 20 mins every day.  You don't have to walk fast, just please make sure you go as this is proven to help with depression.  I will call you in seven days so we can see how things are going.

## 2016-04-08 NOTE — Progress Notes (Signed)
04/08/2016 4:31 PM   DOB: 1965/10/10 / MRN: [id number]  SUBJECTIVE:  Natalie Richard is a 50 y.o. female presenting for a diabetes recheck.  She is taking actos and CHL reveals A1c's have been largely controlled on Actos. She has an allergy to metformin.  She is not taking an ACE inhibitor. Last micro albumin normal in April of 2017.  No documentation in CHL of pneumovax.    She is really here to talk about her depression. She has a long history of dysthymia and reports she lost her mother in early January and feels angry about this.   Immunization History  Administered Date(s) Administered  . Influenza,inj,Quad PF,36+ Mos 07/06/2015  . Tdap 01/09/2010    She is allergic to buspar; augmentin; biaxin; ciprofloxacin; doxycycline; levaquin; metformin and related; pyridium; sulfa antibiotics; and lorabid.   She  has a past medical history of Cancer ([place]); Diabetes mellitus; GERD (gastroesophageal reflux disease); Hyperlipidemia; Hypertension; Hematoma; Migraine; Seasonal allergies; Depression; and Anxiety.    She  reports that she has never smoked. She has never used smokeless tobacco. She reports that she drinks alcohol. She reports that she does not use illicit drugs. She  has no sexual activity history on file. The patient  has past surgical history that includes Abdominal hysterectomy (2004) and Eye surgery.  Her family history includes Cancer in her maternal grandmother; Cancer (age of onset: 33) in her mother; Cancer (age of onset: 60) in her father; Diabetes in her maternal grandfather and mother; Rheum arthritis in her mother.  Review of Systems  Constitutional: Negative for fever and chills.  Gastrointestinal: Negative for nausea.  Skin: Negative for rash.  Neurological: Negative for dizziness and headaches.    Problem list and medications reviewed and updated by myself where necessary, and exist elsewhere in the encounter.   OBJECTIVE:  BP 120/80 mmHg  Pulse 89  Temp(Src)  99.1 F (37.3 C) (Oral)  Resp 16  Ht 5' 6"  (1.676 m)  Wt 244 lb 3.2 oz (110.768 kg)  BMI 39.43 kg/m2  SpO2 96%  Physical Exam  Constitutional: She is oriented to person, place, and time.  Cardiovascular: Normal rate and regular rhythm.   Pulmonary/Chest: Effort normal and breath sounds normal.  Neurological: She is alert and oriented to person, place, and time.  Skin: Skin is warm and dry.  Psychiatric: Her speech is normal. Judgment and thought content normal. Her affect is angry. She is not agitated and not aggressive. Cognition and memory are normal. She exhibits a depressed mood.  Vitals reviewed.   Lab Results  Component Value Date   HGBA1C 7.7 04/08/2016   Lab Results  Component Value Date   MICROALBUR <0.2 01/10/2016   Lab Results  Component Value Date   CREATININE 0.75 01/10/2016   Lab Results  Component Value Date   CHOL 193 01/10/2016   HDL 56 01/10/2016   LDLCALC 98 01/10/2016   TRIG 195* 01/10/2016   CHOLHDL 3.4 01/10/2016   Diabetic Foot Exam - Simple   Simple Foot Form  Visual Inspection  Sensation Testing  Pulse Check  Comments  Foot exam was normal       No results found.  ASSESSMENT AND PLAN  Luvenia was seen today for follow-up.  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  I have advised that she take Ativan only as needed. Advised she walk 20 minutes daily.  Will consider Wellbutrin if she does not improve.  Will call her in 7 days.      The patient was advised to call or return to clinic if she does not see an improvement in symptoms, or to seek the care of the closest emergency department if she worsens with the above plan.   Philis Fendt, MHS, PA-C [facility] 04/08/2016 4:31 PM

## 2016-04-09 LAB — CREATININE WITH EST GFR: CREATININE: 0.76 mg/dL (ref 0.50–1.05)

## 2016-04-09 LAB — HIV ANTIBODY (ROUTINE TESTING W REFLEX): HIV: NONREACTIVE

## 2016-04-15 ENCOUNTER — Telehealth: Payer: Self-pay | Admitting: Physician Assistant

## 2016-04-15 NOTE — Telephone Encounter (Signed)
-----   Message from Tereasa Coop, PA-C sent at 04/08/2016  4:10 PM EDT ----- Call on 04/15/16 regarding atypical depression

## 2016-04-19 ENCOUNTER — Other Ambulatory Visit: Payer: Self-pay

## 2016-04-19 MED ORDER — PIOGLITAZONE HCL 30 MG PO TABS: 30.0000 mg | ORAL_TABLET | Freq: Every day | ORAL | Status: DC

## 2016-04-19 NOTE — Telephone Encounter (Signed)
Pioglitazone Hcl 28m #90 - 0 refills - [facility]

## 2016-04-20 ENCOUNTER — Encounter: Payer: Self-pay | Admitting: Emergency Medicine

## 2016-05-10 ENCOUNTER — Encounter: Payer: Self-pay | Admitting: Emergency Medicine

## 2016-07-12 LAB — HM DIABETES EYE EXAM

## 2016-07-18 ENCOUNTER — Other Ambulatory Visit: Payer: Self-pay | Admitting: Physician Assistant

## 2016-08-03 HISTORY — PX: INNER EAR SURGERY: SHX679

## 2016-08-23 ENCOUNTER — Telehealth: Payer: Self-pay | Admitting: Radiology

## 2016-08-23 NOTE — Telephone Encounter (Signed)
Pt called in stating she only have one ativan left. Pt states she will not be able to come into clinic for a couple weeks due to a recent surgery. Pt is requesting a refill or enough for her to make it until she can come in.   Pt informed me she is taking 2daily.

## 2016-08-24 NOTE — Telephone Encounter (Signed)
Please call in two month supply as previously writtinen. Philis Fendt, MS, PA-C 1:23 PM, 08/24/2016

## 2016-08-26 MED ORDER — LORAZEPAM 1 MG PO TABS: 1.0000 mg | ORAL_TABLET | Freq: Three times a day (TID) | ORAL | 1 refills | Status: DC | PRN

## 2016-08-26 NOTE — Telephone Encounter (Signed)
Called in 2 mos of RFs and pt aware.

## 2016-09-08 ENCOUNTER — Ambulatory Visit: Payer: BLUE CROSS/BLUE SHIELD

## 2016-09-12 ENCOUNTER — Telehealth: Payer: Self-pay | Admitting: Family Medicine

## 2016-09-12 NOTE — Telephone Encounter (Signed)
Pt called for a refill on hr HCTZ please send to [facility] [address]

## 2016-09-14 ENCOUNTER — Other Ambulatory Visit: Payer: Self-pay

## 2016-09-14 MED ORDER — HYDROCHLOROTHIAZIDE 12.5 MG PO CAPS: 12.5000 mg | ORAL_CAPSULE | Freq: Every day | ORAL | 0 refills | Status: DC

## 2016-09-14 NOTE — Telephone Encounter (Signed)
Fax req [facility] [address] HCTZ 12.5 Has appt 12/16 Sent #[phone]

## 2016-09-16 ENCOUNTER — Ambulatory Visit (INDEPENDENT_AMBULATORY_CARE_PROVIDER_SITE_OTHER): Payer: BLUE CROSS/BLUE SHIELD | Admitting: Physician Assistant

## 2016-09-16 VITALS — BP 128/80 | HR 96 | Temp 98.4°F | Resp 17 | Ht 66.0 in | Wt 243.0 lb

## 2016-09-16 DIAGNOSIS — F13930 Sedative, hypnotic or anxiolytic use, unspecified with withdrawal, uncomplicated: Secondary | ICD-10-CM

## 2016-09-16 DIAGNOSIS — F13230 Sedative, hypnotic or anxiolytic dependence with withdrawal, uncomplicated: Secondary | ICD-10-CM | POA: Diagnosis not present

## 2016-09-16 MED ORDER — DIAZEPAM 5 MG PO TABS: 5.0000 mg | ORAL_TABLET | Freq: Four times a day (QID) | ORAL | 0 refills | Status: DC | PRN

## 2016-09-16 MED ORDER — DIAZEPAM 5 MG PO TABS: 5.0000 mg | ORAL_TABLET | ORAL | 0 refills | Status: DC

## 2016-09-16 NOTE — Progress Notes (Addendum)
09/16/2016 10:18 AM   DOB: Feb 09, 1966 / MRN: [id number]  SUBJECTIVE:  Natalie Richard is a 50 y.o. female presenting for anxiety and depression. Reports that she had a tympanoplasty and since that time she has had an increase in axiety due to this.  She was given Valium 5 mg that was prescribed in late July and this script lasted one month.  She notes that she is taking more and more benzodiazepines to control her symptoms.  Feels that she is getting worse.  Is no up to three ativan daily.  She does not drink.    Depression screen PHQ 2/9 09/16/2016  Decreased Interest 2  Down, Depressed, Hopeless 2  PHQ - 2 Score 4  Altered sleeping 2  Tired, decreased energy 2  Change in appetite 2  Feeling bad or failure about yourself  2  Trouble concentrating 2  Moving slowly or fidgety/restless 2  Suicidal thoughts 0  PHQ-9 Score 16  Difficult doing work/chores Very difficult      She is allergic to buspar [buspirone]; augmentin [amoxicillin-pot clavulanate]; biaxin [clarithromycin]; ciprofloxacin; doxycycline; levaquin [levofloxacin]; metformin and related; pyridium [phenazopyridine hcl]; sulfa antibiotics; and lorabid [loracarbef].   She  has a past medical history of Anxiety; Cancer ([place]); Depression; Diabetes mellitus; GERD (gastroesophageal reflux disease); Hematoma; Hyperlipidemia; Hypertension; Migraine; and Seasonal allergies.    She  reports that she has never smoked. She has never used smokeless tobacco. She reports that she drinks alcohol. She reports that she does not use drugs. She  has no sexual activity history on file. The patient  has a past surgical history that includes Abdominal hysterectomy (2004) and Eye surgery.  Her family history includes Cancer in her maternal grandmother; Cancer (age of onset: 40) in her mother; Cancer (age of onset: 42) in her father; Diabetes in her maternal grandfather and mother; Rheum arthritis in her mother.  Review of Systems    Constitutional: Positive for diaphoresis.  Respiratory: Negative for cough.   Cardiovascular: Negative for chest pain and palpitations.  Neurological: Positive for tremors.  Psychiatric/Behavioral: The patient is nervous/anxious.     The problem list and medications were reviewed and updated by myself where necessary and exist elsewhere in the encounter.   OBJECTIVE:  BP 128/80 (BP Location: Right Arm, Patient Position: Sitting, Cuff Size: Normal)   Pulse 96   Temp 98.4 F (36.9 C) (Oral)   Resp 17   Ht 5' 6"  (1.676 m)   Wt 243 lb (110.2 kg)   SpO2 95%   BMI 39.22 kg/m   Physical Exam  Constitutional: She is oriented to person, place, and time.  Cardiovascular: Normal rate and regular rhythm.   Pulmonary/Chest: Effort normal and breath sounds normal.  Musculoskeletal: Normal range of motion.  Neurological: She is alert and oriented to person, place, and time.  Skin: She is not diaphoretic.    No results found for this or any previous visit (from the past 72 hour(s)).  No results found.  ASSESSMENT AND PLAN  Natalie Richard was seen today for anxiety, depression and labs only.  Diagnoses and all orders for this visit:  Benzodiazepine withdrawal without complication [facility]): She has been progressevily taking more ativan to manage her symptoms.  Will attempt to ween her off.  She has been historically difficult to treat.  Will get her into psychiatry.  Starting Valium.  -     Ambulatory referral to Psychiatry -     diazepam (VALIUM) 5 MG tablet; Take 1 tablet (5 mg  total) by mouth daily.  Other orders -     Discontinue: diazepam (VALIUM) 5 MG tablet; Take 1 tablet (5 mg total) by mouth every 6 (six) hours as needed for anxiety.    The patient is advised to call or return to clinic if she does not see an improvement in symptoms, or to seek the care of the closest emergency department if she worsens with the above plan.   Philis Fendt, MHS, PA-C [facility] 09/16/2016 10:18 AM

## 2016-09-16 NOTE — Patient Instructions (Addendum)
Take 1 Ativan in the morning and 1/2 tab 12 hours later.  Do not take more. For intermittent anxiousness take hydroxyzine.   Sweatiness, tremor, fast heart rate are all sign that you are not getting enough benzo.      IF you received an x-ray today, you will receive an invoice from [facility]. Please contact [facility] at [phone] with questions or concerns regarding your invoice.   IF you received labwork today, you will receive an invoice from [facility]. Please contact [facility] at [phone] with questions or concerns regarding your invoice.   Our billing staff will not be able to assist you with questions regarding bills from these companies.  You will be contacted with the lab results as soon as they are available. The fastest way to get your results is to activate your My Chart account. Instructions are located on the last page of this paperwork. If you have not heard from [place] regarding the results in 2 weeks, please contact this office.

## 2016-09-17 ENCOUNTER — Ambulatory Visit: Payer: BLUE CROSS/BLUE SHIELD

## 2016-09-20 NOTE — Telephone Encounter (Signed)
This was done on 12/13.

## 2016-09-23 ENCOUNTER — Ambulatory Visit (INDEPENDENT_AMBULATORY_CARE_PROVIDER_SITE_OTHER): Payer: BLUE CROSS/BLUE SHIELD | Admitting: Physician Assistant

## 2016-09-23 VITALS — BP 126/80 | HR 86 | Temp 98.7°F | Resp 18 | Ht 66.0 in | Wt 244.0 lb

## 2016-09-23 DIAGNOSIS — I10 Essential (primary) hypertension: Secondary | ICD-10-CM

## 2016-09-23 DIAGNOSIS — F13230 Sedative, hypnotic or anxiolytic dependence with withdrawal, uncomplicated: Secondary | ICD-10-CM

## 2016-09-23 DIAGNOSIS — R1319 Other dysphagia: Secondary | ICD-10-CM

## 2016-09-23 DIAGNOSIS — R1310 Dysphagia, unspecified: Secondary | ICD-10-CM

## 2016-09-23 DIAGNOSIS — F13930 Sedative, hypnotic or anxiolytic use, unspecified with withdrawal, uncomplicated: Secondary | ICD-10-CM

## 2016-09-23 MED ORDER — HYDROCHLOROTHIAZIDE 12.5 MG PO CAPS: 12.5000 mg | ORAL_CAPSULE | Freq: Every day | ORAL | 1 refills | Status: DC

## 2016-09-23 MED ORDER — DIAZEPAM 5 MG PO TABS: 5.0000 mg | ORAL_TABLET | Freq: Every day | ORAL | 0 refills | Status: DC

## 2016-09-23 NOTE — Patient Instructions (Addendum)
Take 1/2 ativan in the morning and 1/2 at night. Continue valium.   I can add hydroxazine if needed.    IF you received an x-ray today, you will receive an invoice from [facility]. Please contact [facility] at [phone] with questions or concerns regarding your invoice.   IF you received labwork today, you will receive an invoice from [facility]. Please contact [facility] at [phone] with questions or concerns regarding your invoice.   Our billing staff will not be able to assist you with questions regarding bills from these companies.  You will be contacted with the lab results as soon as they are available. The fastest way to get your results is to activate your My Chart account. Instructions are located on the last page of this paperwork. If you have not heard from [place] regarding the results in 2 weeks, please contact this office.

## 2016-09-23 NOTE — Progress Notes (Signed)
09/23/2016 12:20 PM   DOB: [birth year] / MRN: [id number]  SUBJECTIVE:  Natalie Richard is a 51 y.o. female presenting for benzodiazepine withdrawal. I started 5 mg valium. She has also been taking 1 Ativan in the morning and 0.5 Ativan at night along with the valium. She continues to have some sweating and shakiness however feels this is better.   She did see her ENT doctor yesterday for ETD s/p surgery and the ENT advised she is on course.   She complains of food getting stuck just past her larnyx.  States that this has been present for about 4 months now.  She has had GERD for "years now" and takes dexilant and denies any bruning and chest tightness at this time. She had a scope in 2014 and her esophagus was normal at this time.   Depression screen [facility] 2/9 09/23/2016  Decreased Interest 0  Down, Depressed, Hopeless 0  PHQ - 2 Score 0  Altered sleeping -  Tired, decreased energy -  Change in appetite -  Feeling bad or failure about yourself  -  Trouble concentrating -  Moving slowly or fidgety/restless -  Suicidal thoughts -  PHQ-9 Score -  Difficult doing work/chores -      She is allergic to buspar [buspirone]; augmentin [amoxicillin-pot clavulanate]; biaxin [clarithromycin]; ciprofloxacin; doxycycline; levaquin [levofloxacin]; metformin and related; pyridium [phenazopyridine hcl]; sulfa antibiotics; and lorabid [loracarbef].   She  has a past medical history of Anxiety; Cancer ([place]); Depression; Diabetes mellitus; GERD (gastroesophageal reflux disease); Hematoma; Hyperlipidemia; Hypertension; Migraine; and Seasonal allergies.    She  reports that she has never smoked. She has never used smokeless tobacco. She reports that she drinks alcohol. She reports that she does not use drugs. She  has no sexual activity history on file. The patient  has a past surgical history that includes Abdominal hysterectomy (2004) and Eye surgery.  Her family history includes Cancer in her maternal  grandmother; Cancer (age of onset: 75) in her mother; Cancer (age of onset: 47) in her father; Diabetes in her maternal grandfather and mother; Rheum arthritis in her mother.  Review of Systems  Constitutional: Positive for diaphoresis (decreasing). Negative for fever.  Gastrointestinal: Positive for nausea. Negative for abdominal pain, blood in stool, constipation, diarrhea, heartburn, melena and vomiting.  Musculoskeletal: Negative for myalgias.  Skin: Negative for rash.  Neurological: Positive for tremors (decreasing). Negative for dizziness.    The problem list and medications were reviewed and updated by myself where necessary and exist elsewhere in the encounter.   OBJECTIVE:  BP 126/80 (BP Location: Right Arm, Patient Position: Sitting, Cuff Size: Large)   Pulse 86   Temp 98.7 F (37.1 C) (Oral)   Resp 18   Ht 5' 6"  (1.676 m)   Wt 244 lb (110.7 kg)   SpO2 97%   BMI 39.38 kg/m   Pulse Readings from Last 3 Encounters:  09/23/16 86  09/16/16 96  04/08/16 89   BP Readings from Last 3 Encounters:  09/23/16 126/80  09/16/16 128/80  04/08/16 120/80   Lab Results  Component Value Date   CREATININE 0.76 04/08/2016   Lab Results  Component Value Date   K 3.6 01/10/2016     Physical Exam  Constitutional: She is oriented to person, place, and time.  Cardiovascular: Normal rate and regular rhythm.   Pulmonary/Chest: Effort normal and breath sounds normal.  Abdominal: Soft. Bowel sounds are normal. She exhibits no distension and no mass.  Musculoskeletal: Normal range  of motion.  Neurological: She is alert and oriented to person, place, and time. She displays no tremor.  Skin: Skin is warm and dry. She is not diaphoretic.   Lab Results  Component Value Date   HGBA1C 7.7 04/08/2016     No results found for this or any previous visit (from the past 72 hour(s)).  No results found.  ASSESSMENT AND PLAN  Wendell was seen today for follow-up.  Diagnoses and all  orders for this visit:  Esophageal dysphagia: She has a concerning HPI.  Fortunately her last scope was normal in 2014 and I will send her back to Dr. Collene Mares to discuss her symptoms.  -     Ambulatory referral to Gastroenterology  Benzodiazepine withdrawal without complication [facility]): Vitals normal. Overall she has done will with slowly reducing her ativan.  She is maintained on 5 valium and I am lower her ativan to 1/2 morning and night.  Psych consult pending in February.  Will see her back in to weeks.       -     diazepam (VALIUM) 5 MG tablet; Take 1 tablet (5 mg total) by mouth daily.   Benign essential HTN: Controlled.  Labs current.       -     hydrochlorothiazide (MICROZIDE) 12.5 MG capsule; Take 1 capsule (12.5 mg total) by mouth daily.     The patient is advised to call or return to clinic if she does not see an improvement in symptoms, or to seek the care of the closest emergency department if she worsens with the above plan.   Philis Fendt, MHS, PA-C [facility] 09/23/2016 12:20 PM

## 2016-10-03 ENCOUNTER — Telehealth: Payer: Self-pay | Admitting: Physician Assistant

## 2016-10-03 NOTE — Telephone Encounter (Signed)
Patient calling after hours line complaining of cough.  I called her back and she reports this has been present for about 4 days not.  Associates sore throat, nasal congestion and clear rhinorrhea.  No SOB, diaphoresis, chills, nausea, significant fever.  Advised naprosyn, OTC cough syrup and tessalon 200 mg q8 #30 which was called into [facility] on [address].  She will come in to PCP tomorrow morning for further eval.  If any concerning symptoms until then she will go to the ED. Philis Fendt, MS, PA-C 11:42 AM, 10/03/2016

## 2016-10-04 ENCOUNTER — Ambulatory Visit (INDEPENDENT_AMBULATORY_CARE_PROVIDER_SITE_OTHER): Payer: BLUE CROSS/BLUE SHIELD | Admitting: Physician Assistant

## 2016-10-04 VITALS — BP 138/92 | HR 98 | Temp 98.5°F | Resp 16 | Ht 66.0 in | Wt 247.0 lb

## 2016-10-04 DIAGNOSIS — J069 Acute upper respiratory infection, unspecified: Secondary | ICD-10-CM

## 2016-10-04 DIAGNOSIS — B9789 Other viral agents as the cause of diseases classified elsewhere: Secondary | ICD-10-CM

## 2016-10-04 MED ORDER — HYDROCODONE-HOMATROPINE 5-1.5 MG/5ML PO SYRP: 2.5000 mL | ORAL_SOLUTION | Freq: Every evening | ORAL | 0 refills | Status: DC | PRN

## 2016-10-04 NOTE — Patient Instructions (Signed)
     IF you received an x-ray today, you will receive an invoice from [facility]. Please contact [facility] at [phone] with questions or concerns regarding your invoice.   IF you received labwork today, you will receive an invoice from [facility]. Please contact [facility] at [phone] with questions or concerns regarding your invoice.   Our billing staff will not be able to assist you with questions regarding bills from these companies.  You will be contacted with the lab results as soon as they are available. The fastest way to get your results is to activate your My Chart account. Instructions are located on the last page of this paperwork. If you have not heard from us regarding the results in 2 weeks, please contact this office.     

## 2016-10-04 NOTE — Progress Notes (Signed)
   10/04/2016 10:21 AM   DOB: August 05, 1966 / MRN: [id number]  SUBJECTIVE:  Natalie Richard is a 51 y.o. female presenting for cough.  See my previous phone message for more HPI. Her ROS is reassuring.     She  has a past medical history of Anxiety; Cancer ([place]); Depression; Diabetes mellitus; GERD (gastroesophageal reflux disease); Hematoma; Hyperlipidemia; Hypertension; Migraine; and Seasonal allergies.     Review of Systems  Constitutional: Negative for chills and fever.  HENT: Positive for sore throat.   Skin: Negative for rash.  Neurological: Negative for dizziness.    The problem list and medications were reviewed and updated by myself where necessary and exist elsewhere in the encounter.   OBJECTIVE:  BP (!) 138/92 (BP Location: Right Arm, Patient Position: Sitting, Cuff Size: Large)   Pulse 98   Temp 98.5 F (36.9 C) (Oral)   Resp 16   Ht 5' 6"  (1.676 m)   Wt 247 lb (112 kg)   SpO2 95%   BMI 39.87 kg/m   Physical Exam  Constitutional: She is oriented to person, place, and time.  HENT:  Right Ear: Tympanic membrane normal.  Left Ear: Tympanic membrane normal.  Nose: Nose normal.  Mouth/Throat: Uvula is midline, oropharynx is clear and moist and mucous membranes are normal.  Cardiovascular: Normal rate, regular rhythm and normal heart sounds.   Pulmonary/Chest: Effort normal and breath sounds normal. No respiratory distress. She has no wheezes. She has no rales. She exhibits no tenderness.  Musculoskeletal: Normal range of motion.  Neurological: She is alert and oriented to person, place, and time.    No results found for this or any previous visit (from the past 72 hour(s)).  No results found.  ASSESSMENT AND PLAN  Natalie Richard was seen today for cough and nasal congestion.  Diagnoses and all orders for this visit:  Viral URI with cough: She is doing well.  No alarm signs.  Advised she continue her home treatment regimen and I am adding hycodan for cough.  -      HYDROcodone-homatropine (HYCODAN) 5-1.5 MG/5ML syrup; Take 2.5-5 mLs by mouth at bedtime as needed.    The patient is advised to call or return to clinic if she does not see an improvement in symptoms, or to seek the care of the closest emergency department if she worsens with the above plan.   Philis Fendt, MHS, PA-C [facility] 10/04/2016 10:21 AM

## 2016-10-07 ENCOUNTER — Ambulatory Visit: Payer: BLUE CROSS/BLUE SHIELD

## 2016-10-08 ENCOUNTER — Ambulatory Visit (INDEPENDENT_AMBULATORY_CARE_PROVIDER_SITE_OTHER): Payer: BLUE CROSS/BLUE SHIELD | Admitting: Physician Assistant

## 2016-10-08 VITALS — BP 126/80 | HR 84 | Temp 97.9°F | Resp 18 | Ht 66.0 in | Wt 246.0 lb

## 2016-10-08 DIAGNOSIS — F13230 Sedative, hypnotic or anxiolytic dependence with withdrawal, uncomplicated: Secondary | ICD-10-CM

## 2016-10-08 DIAGNOSIS — F13930 Sedative, hypnotic or anxiolytic use, unspecified with withdrawal, uncomplicated: Secondary | ICD-10-CM

## 2016-10-08 MED ORDER — DIAZEPAM 5 MG PO TABS: 5.0000 mg | ORAL_TABLET | Freq: Every day | ORAL | 0 refills | Status: DC

## 2016-10-08 NOTE — Patient Instructions (Addendum)
Take 1/2 ativan in the morning only. Continue Valium at your normal time and I will see you back in 2 weeks.     IF you received an x-ray today, you will receive an invoice from [facility]. Please contact [facility] at [phone] with questions or concerns regarding your invoice.   IF you received labwork today, you will receive an invoice from [place]. Please contact [facility] at [phone] with questions or concerns regarding your invoice.   Our billing staff will not be able to assist you with questions regarding bills from these companies.  You will be contacted with the lab results as soon as they are available. The fastest way to get your results is to activate your My Chart account. Instructions are located on the last page of this paperwork. If you have not heard from [place] regarding the results in 2 weeks, please contact this office.

## 2016-10-08 NOTE — Progress Notes (Signed)
  10/08/2016 7:30 PM   DOB: May 18, 1966 / MRN: [id number]  SUBJECTIVE:  Natalie Richard is a 51 y.o. female presenting for mediction titration.  I am weaning her off of ativan and as of last night she felt that that she did not need to take her ativan.  She is willing to reduce her dose again. She feels well overall and is no longer have tremulousness, episodic diaphoresis, and rebound panic.   She is allergic to buspar [buspirone]; augmentin [amoxicillin-pot clavulanate]; biaxin [clarithromycin]; ciprofloxacin; doxycycline; levaquin [levofloxacin]; metformin and related; pyridium [phenazopyridine hcl]; sulfa antibiotics; and lorabid [loracarbef].   She  has a past medical history of Anxiety; Cancer ([place]); Depression; Diabetes mellitus; GERD (gastroesophageal reflux disease); Hematoma; Hyperlipidemia; Hypertension; Migraine; and Seasonal allergies.    She  reports that she has never smoked. She has never used smokeless tobacco. She reports that she drinks alcohol. She reports that she does not use drugs. She  has no sexual activity history on file. The patient  has a past surgical history that includes Abdominal hysterectomy (2004) and Eye surgery.  Her family history includes Cancer in her maternal grandmother; Cancer (age of onset: 29) in her mother; Cancer (age of onset: 23) in her father; Diabetes in her maternal grandfather and mother; Rheum arthritis in her mother.  Review of Systems  Neurological: Negative for dizziness.    The problem list and medications were reviewed and updated by myself where necessary and exist elsewhere in the encounter.   OBJECTIVE:  BP 126/80 (BP Location: Right Arm, Patient Position: Sitting, Cuff Size: Large)   Pulse 84   Temp 97.9 F (36.6 C) (Oral)   Resp 18   Ht 5' 6"  (1.676 m)   Wt 246 lb (111.6 kg)   SpO2 98%   BMI 39.71 kg/m   Pulse Readings from Last 3 Encounters:  10/08/16 84  10/04/16 98  09/23/16 86     Physical Exam  Constitutional:  She is oriented to person, place, and time.  Cardiovascular: Normal rate and regular rhythm.   Pulmonary/Chest: Effort normal and breath sounds normal.  Musculoskeletal: Normal range of motion.  Neurological: She is alert and oriented to person, place, and time.  Skin: Skin is warm and dry.    No results found for this or any previous visit (from the past 72 hour(s)).  No results found.  ASSESSMENT AND PLAN:  Cruz was seen today for follow-up.  Diagnoses and all orders for this visit:  Benzodiazepine withdrawal without complication ([place]) Comments: She will be taking 1/2 ativan qam.  She is down from roughly 3 daily.  Will maintian valium at 5 mg at this time and see her back in two weeks for titration.  Orders: -     diazepam (VALIUM) 5 MG tablet; Take 1 tablet (5 mg total) by mouth daily.    The patient is advised to call or return to clinic if she does not see an improvement in symptoms, or to seek the care of the closest emergency department if she worsens with the above plan.   Philis Fendt, MHS, PA-C [facility] 10/08/2016 7:30 PM

## 2016-10-13 ENCOUNTER — Encounter: Payer: Self-pay | Admitting: Family Medicine

## 2016-10-21 ENCOUNTER — Ambulatory Visit: Payer: BLUE CROSS/BLUE SHIELD

## 2016-10-24 ENCOUNTER — Other Ambulatory Visit: Payer: Self-pay | Admitting: Physician Assistant

## 2016-11-14 ENCOUNTER — Ambulatory Visit (INDEPENDENT_AMBULATORY_CARE_PROVIDER_SITE_OTHER): Payer: BLUE CROSS/BLUE SHIELD | Admitting: Physician Assistant

## 2016-11-14 ENCOUNTER — Encounter: Payer: Self-pay | Admitting: Physician Assistant

## 2016-11-14 VITALS — BP 135/83 | HR 95 | Temp 98.1°F | Resp 18 | Ht 66.0 in | Wt 246.4 lb

## 2016-11-14 DIAGNOSIS — F13930 Sedative, hypnotic or anxiolytic use, unspecified with withdrawal, uncomplicated: Secondary | ICD-10-CM

## 2016-11-14 DIAGNOSIS — F13230 Sedative, hypnotic or anxiolytic dependence with withdrawal, uncomplicated: Secondary | ICD-10-CM

## 2016-11-14 DIAGNOSIS — F418 Other specified anxiety disorders: Secondary | ICD-10-CM

## 2016-11-14 DIAGNOSIS — F329 Major depressive disorder, single episode, unspecified: Secondary | ICD-10-CM

## 2016-11-14 DIAGNOSIS — F32A Depression, unspecified: Secondary | ICD-10-CM

## 2016-11-14 DIAGNOSIS — F419 Anxiety disorder, unspecified: Secondary | ICD-10-CM

## 2016-11-14 MED ORDER — DIAZEPAM 2 MG PO TABS: 4.0000 mg | ORAL_TABLET | Freq: Four times a day (QID) | ORAL | 0 refills | Status: DC | PRN

## 2016-11-14 NOTE — Patient Instructions (Addendum)
  Take as little Ativan as possible. Follow up with  Psychiatric.  IF you received an x-ray today, you will receive an invoice from [facility]. Please contact [facility] at [phone] with questions or concerns regarding your invoice.   IF you received labwork today, you will receive an invoice from [place]. Please contact [facility] at [phone] with questions or concerns regarding your invoice.   Our billing staff will not be able to assist you with questions regarding bills from these companies.  You will be contacted with the lab results as soon as they are available. The fastest way to get your results is to activate your My Chart account. Instructions are located on the last page of this paperwork. If you have not heard from [place] regarding the results in 2 weeks, please contact this office.

## 2016-11-14 NOTE — Progress Notes (Signed)
11/14/2016 2:41 PM   DOB: [birth year] / MRN: [id number]  SUBJECTIVE:  Genean Adamski is a 51 y.o. female presenting for anxiety recheck.  She is weaning off of benzodiazepines and admits to needing her ativan despite my advise to stop. She has an appointment with psychiatry in about a month and plans to attend this.  She has a therapist but feels this person is no longer truly helpful to her.  Feels that she can get the same benefit with talking to a friend.   She is allergic to buspar [buspirone]; augmentin [amoxicillin-pot clavulanate]; biaxin [clarithromycin]; ciprofloxacin; doxycycline; levaquin [levofloxacin]; metformin and related; pyridium [phenazopyridine hcl]; sulfa antibiotics; and lorabid [loracarbef].   She  has a past medical history of Anxiety; Cancer ([place]); Depression; Diabetes mellitus; GERD (gastroesophageal reflux disease); Hematoma; Hyperlipidemia; Hypertension; Migraine; and Seasonal allergies.    She  reports that she has never smoked. She has never used smokeless tobacco. She reports that she drinks alcohol. She reports that she does not use drugs. She  has no sexual activity history on file. The patient  has a past surgical history that includes Abdominal hysterectomy (2004) and Eye surgery.  Her family history includes Cancer in her maternal grandmother; Cancer (age of onset: 22) in her mother; Cancer (age of onset: 46) in her father; Diabetes in her maternal grandfather and mother; Rheum arthritis in her mother.  Review of Systems  Cardiovascular: Negative for chest pain.  Skin: Negative for rash.  Neurological: Negative for dizziness.  Psychiatric/Behavioral: Positive for depression. Negative for suicidal ideas. The patient is nervous/anxious. The patient does not have insomnia.     The problem list and medications were reviewed and updated by myself where necessary and exist elsewhere in the encounter.   OBJECTIVE:  BP 135/83 (BP Location: Right Arm, Patient  Position: Sitting, Cuff Size: Large)   Pulse 95   Temp 98.1 F (36.7 C) (Oral)   Resp 18   Ht 5' 6"  (1.676 m)   Wt 246 lb 6.4 oz (111.8 kg)   SpO2 96%   BMI 39.77 kg/m     Physical Exam  Constitutional: She is oriented to person, place, and time. She appears well-developed and well-nourished.  Cardiovascular: Normal rate.  Exam reveals no gallop and no friction rub.   No murmur heard. Pulmonary/Chest: Effort normal.  Musculoskeletal: Normal range of motion.  Neurological: She is alert and oriented to person, place, and time. She has normal reflexes. She displays no atrophy and no tremor. No cranial nerve deficit. She exhibits normal muscle tone. She displays no seizure activity. Coordination and gait normal.    No results found for this or any previous visit (from the past 72 hour(s)).  No results found.  ASSESSMENT AND PLAN:  Nakai was seen today for follow-up.  Diagnoses and all orders for this visit:  Benzodiazepine withdrawal without complication ([place]) Comments: Reducing her valium dose to 4 mg qd from 5.  No more physical withdrawal symptoms however she has been unsucessful in avoiding ativan. Pych pending.  This seems to be more poor coping skills versus a true panic.  I'd be more agressive with atypicals however she has diabetes.  Advised that she really push herself to avoid ativan if possible.  RTC in 2 months for diabetes recheck.   Orders: -     diazepam (VALIUM) 2 MG tablet; Take 2 tablets (4 mg total) by mouth every 6 (six) hours as needed for anxiety.    The patient is advised to call  or return to clinic if she does not see an improvement in symptoms, or to seek the care of the closest emergency department if she worsens with the above plan.   Philis Fendt, MHS, PA-C [facility] 11/14/2016 2:41 PM

## 2016-11-16 ENCOUNTER — Ambulatory Visit (HOSPITAL_COMMUNITY): Payer: Self-pay | Admitting: Psychiatry

## 2016-12-07 ENCOUNTER — Encounter (HOSPITAL_COMMUNITY): Payer: Self-pay | Admitting: Psychiatry

## 2016-12-07 ENCOUNTER — Ambulatory Visit (INDEPENDENT_AMBULATORY_CARE_PROVIDER_SITE_OTHER): Payer: BLUE CROSS/BLUE SHIELD | Admitting: Psychiatry

## 2016-12-07 ENCOUNTER — Ambulatory Visit (HOSPITAL_COMMUNITY): Payer: Self-pay | Admitting: Psychiatry

## 2016-12-07 VITALS — BP 128/86 | HR 94 | Ht 66.0 in | Wt 246.0 lb

## 2016-12-07 DIAGNOSIS — F410 Panic disorder [episodic paroxysmal anxiety] without agoraphobia: Secondary | ICD-10-CM | POA: Diagnosis not present

## 2016-12-07 DIAGNOSIS — Z79899 Other long term (current) drug therapy: Secondary | ICD-10-CM

## 2016-12-07 DIAGNOSIS — F3341 Major depressive disorder, recurrent, in partial remission: Secondary | ICD-10-CM

## 2016-12-07 DIAGNOSIS — Z818 Family history of other mental and behavioral disorders: Secondary | ICD-10-CM

## 2016-12-07 MED ORDER — CITALOPRAM HYDROBROMIDE 40 MG PO TABS: 40.0000 mg | ORAL_TABLET | Freq: Every day | ORAL | 0 refills | Status: DC

## 2016-12-07 MED ORDER — CLONAZEPAM 0.5 MG PO TABS: 0.5000 mg | ORAL_TABLET | Freq: Two times a day (BID) | ORAL | 1 refills | Status: DC

## 2016-12-07 NOTE — Progress Notes (Signed)
Psychiatric Initial Adult Assessment   Patient Identification: Natalie Richard MRN:  [id number] Date of Evaluation:  12/07/2016 Referral Source: pcp Chief Complaint:  Anxiety, panic Chief Complaint    Anxiety; Agitation; Other     Visit Diagnosis:    ICD-9-CM ICD-10-CM   1. Panic disorder 300.01 F41.0 clonazePAM (KLONOPIN) 0.5 MG tablet     citalopram (CELEXA) 40 MG tablet  2. Recurrent major depressive disorder, in partial remission (HCC) 296.35 F33.41 clonazePAM (KLONOPIN) 0.5 MG tablet     citalopram (CELEXA) 40 MG tablet   History of Present Illness:  Natalie Richard is a 51 year old female with a psychiatric history of major depressive disorder and panic disorder who presents today for medication management assessment. She reports that in December, her primary care provider began tapering her off of lorazepam. She reports that the reasoning for this was that the patient was requiring an additional dose of lorazepam in the middle of the day. She also reports that her primary care provider voiced concern about the patient having withdrawal symptoms in the evening.  I reviewed the patient's primary care visits to try and understand this, and in discussing with the patient, it appears that the patient was having rebound anxiety during the middle of the day as her lorazepam will start to wear way.  The patient reports that since her Ativan has been tapered in the past few weeks, she has noted that she has been more jittery, anxious, socially withdrawn, and has had more panic attacks. She reports, that she is ashamed to say, that she has been drinking more the past couple weeks, because alcohol settles her anxiety down. She reports that she might have 3 beers in a day, with one to 2 shots of liquor, in social settings with her friends and her husband. She reports that she never had any significant alcohol use problems before the past 2 weeks, and she has never had any alcohol withdrawal symptoms.  She reports that she knows that this is completely unacceptable as a way to cope with her stress and anxiety, and she is on board to stop using alcohol in this level and in this manner.  She reports that her anxiety was getting to the point where she didn't want leave the house anymore.  She shared her recent psychiatric history with me, specifically regarding her mother's passing last year. She reports that she was very close with both her mother and her father growing up, and that she was an only child. She reports that her mother and her headache very close relationship, and it was very difficult when her mother passed away. She reports that she now continues to contend with the aftermath of her mother's death, as she continues to try to sort through the state, needs to sell some of the land, and needs to clean out mom's home. She reports that her mom was a hoarder, and the patient has avoided dealing with these very anxiety provoking tasks and issues. The patient reports that she has panic attacks several times per week, and describes palpitations, sweating, shaking, and subjective anxiety. These generally have to do with thinking about her mom, and the tasks she needs to get done regarding her mom's estate. Next  The patient reports that she was tried on multiple medications in the past, including Wellbutrin, Zoloft, Celexa, Effexor, lorazepam, Valium. She reports that Celexa has been okay for her depression, but she continues to be quite anxious. We discussed using clonazepam at a dose of 0.5  mg twice daily, and to discontinue Valium and lorazepam completely. Spent time discussing the risks and benefits of benzodiazepine, and that I do not wish to see her on this medication for chronic use, but at this point she is having a hard time functioning, and is turning to alcohol use in order to deal with her anxiety, so weighing the risks and benefits, we will proceed as we discussed. She denies any acute SI, and  feels safe with herself.  She reports that her husband is usually pretty supportive, but he has bipolar disorder, so he has periods where he will become much more irritable and angry, and his mood will be more labile. She reports that her husband does not take his medications for bipolar disorder as he is prescribed, so this as an additional layer of stress to their marriage.  Associated Signs/Symptoms: Depression Symptoms:  depressed mood, anhedonia, feelings of worthlessness/guilt, difficulty concentrating, anxiety, panic attacks, (Hypo) Manic Symptoms:  none Anxiety Symptoms:  Panic Symptoms, Social Anxiety, Psychotic Symptoms:  none PTSD Symptoms: Negative  Past Psychiatric History: Patient has a history of a 2 day medical hospitalization associated with panic attack in 2009. No prior psychiatric hospitalizations. Medication trials as above  Previous Psychotropic Medications: Yes   Substance Abuse History in the last 12 months:  Yes.    Consequences of Substance Abuse: Patient realizes that she has been misusing alcohol for the past 2-3 weeks. She does not present with any acute risks of withdrawal seizures, or delirium tremens. She has no history of DUI  Past Medical History:  Past Medical History:  Diagnosis Date  . Anxiety   . 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 Depression   . Diabetes mellitus   . Diabetes mellitus, type II ([place])   . GERD (gastroesophageal reflux disease)   . Hematoma    on buttock  . Hyperlipidemia   . Hypertension   . Migraine   . Seasonal allergies     Past Surgical History:  Procedure Laterality Date  . ABDOMINAL HYSTERECTOMY  2004   PCOS  . EYE SURGERY    . INNER EAR SURGERY Left 08/2016    Family Psychiatric History: Mom was a Ship broker  Family History:  Family History  Problem Relation Age of Onset  . Cancer Mother 58    colon  . Diabetes Mother   . Rheum arthritis Mother   . Dementia Mother   . Depression  Mother   . Cancer Father 97    bone and prostate  . Cancer Maternal Grandmother     breast  . Diabetes Maternal Grandfather   . Depression Cousin     Social History:   Social History   Social History  . Marital status: Married    Spouse name: Natalie Richard  . Number of children: 0  . Years of education: 12+   Occupational History  . Registration/Scheduling     [employer]   Social History Main Topics  . Smoking status: Never Smoker  . Smokeless tobacco: Never Used  . Alcohol use 4.8 - 6.0 oz/week    6 Cans of beer, 2 Shots of liquor per week     Comment: weekly  . Drug use: No  . Sexual activity: No   Other Topics Concern  . None   Social History Narrative   Married to Natalie Richard is employed at [employer], was unemployed for some time. Verbally abusive at times. Feels safe at home,  or can leave the house. Husband drinks almost daily.    Has 2 black labs, Natalie Richard and a black cat, Natalie Richard.    No children.   Formerly worked at [employer]. Was "let go" while FMLA for hematoma from accidental fall in January 2013. Now works at [employer].   Likes to walk for exercise and for stress relief.    Additional Social History: Lives at home with her husband. Previously worked as a Neurosurgeon in International aid/development worker  Allergies:   Allergies  Allergen Reactions  . Buspar [Buspirone] Other (See Comments)    Lip swelling and rash numbness   . Augmentin [Amoxicillin-Pot Clavulanate] Nausea And Vomiting  . Biaxin [Clarithromycin]   . Ciprofloxacin     Developed itching with IV form in the hospital, but has since taken PO without a problem  . Doxycycline Itching  . Levaquin [Levofloxacin] Hives  . Metformin And Related Nausea And Vomiting  . Pyridium [Phenazopyridine Hcl]     Does not remember reaction  . Sulfa Antibiotics     As a younger person had itching, but has taken more recently and did ok  . Lorabid [Loracarbef] Rash     Metabolic Disorder Labs: Lab Results  Component Value Date   HGBA1C 7.7 04/08/2016   No results found for: PROLACTIN Lab Results  Component Value Date   CHOL 193 01/10/2016   TRIG 195 (H) 01/10/2016   HDL 56 01/10/2016   CHOLHDL 3.4 01/10/2016   VLDL 39 (H) 01/10/2016   LDLCALC 98 01/10/2016   LDLCALC 83 03/29/2015     Current Medications: Current Outpatient Prescriptions  Medication Sig Dispense Refill  . citalopram (CELEXA) 40 MG tablet Take 1 tablet (40 mg total) by mouth daily. 90 tablet 0  . dexlansoprazole (DEXILANT) 60 MG capsule Take 1 capsule (60 mg total) by mouth daily. 90 capsule 3  . diazepam (VALIUM) 2 MG tablet Take 2 tablets (4 mg total) by mouth every 6 (six) hours as needed for anxiety. 60 tablet 0  . folic acid (FOLVITE) 1 MG tablet TAKE 1 TABLET (1 MG TOTAL) BY MOUTH DAILY 90 tablet 3  . hydrochlorothiazide (MICROZIDE) 12.5 MG capsule Take 1 capsule (12.5 mg total) by mouth daily. 90 capsule 1  . loratadine (CLARITIN) 10 MG tablet Take 1 tablet (10 mg total) by mouth daily. 90 tablet 3  . LORazepam (ATIVAN) 1 MG tablet Take 1 tablet (1 mg total) by mouth every 8 (eight) hours as needed. for anxiety 90 tablet 1  . Multiple Vitamins-Minerals (MULTIVITAMIN WITH MINERALS) tablet Take 1 tablet by mouth daily.    . Omega-3 Fatty Acids (FISH OIL) 1000 MG CAPS Take by mouth.    . pioglitazone (ACTOS) 30 MG tablet Take 1 tablet (30 mg total) by mouth daily. 90 tablet 1  . rosuvastatin (CRESTOR) 40 MG tablet TAKE 1 TABLET (40 MG TOTAL) BY MOUTH DAILY. 90 tablet 1  . S-Adenosylmethionine (SAME PO) Take by mouth.    . clonazePAM (KLONOPIN) 0.5 MG tablet Take 1 tablet (0.5 mg total) by mouth 2 (two) times daily. 60 tablet 1   No current facility-administered medications for this visit.     Neurologic: Headache: Negative Seizure: Negative Paresthesias:Negative  Musculoskeletal: Strength & Muscle Tone: within normal limits Gait & Station: normal Patient leans:  N/A  Psychiatric Specialty Exam: Review of Systems  Constitutional: Negative.   HENT: Negative.   Respiratory: Negative.   Cardiovascular: Negative.   Gastrointestinal: Negative.  Neurological: Negative.   Psychiatric/Behavioral: Positive for depression. The patient is nervous/anxious.     Blood pressure 128/86, pulse 94, height 5' 6"  (1.676 m), weight 111.6 kg (246 lb), SpO2 96 %.Body mass index is 39.71 kg/m.  General Appearance: Casual  Eye Contact:  Good  Speech:  Clear and Coherent  Volume:  Normal  Mood:  Anxious  Affect:  Tearful, appropriate to context, calm during appropriate times  Thought Process:  Coherent  Orientation:  Full (Time, Place, and Person)  Thought Content:  Logical  Suicidal Thoughts:  No  Homicidal Thoughts:  No  Memory:  Immediate;   Good  Judgement:  Fair  Insight:  Fair  Psychomotor Activity:  Normal  Concentration:  Concentration: Good and Attention Span: Good  Recall:  NA  Fund of Knowledge:Good  Language: Good  Akathisia:  Negative  Handed:  Right  AIMS (if indicated):  n/a  Assets:  Communication Skills Desire for Improvement Financial Resources/Insurance Housing Intimacy Leisure Time Physical Health Resilience Social Support Transportation  ADL's:  Intact  Cognition: WNL  Sleep:  8 hours nightly    Treatment Plan Summary: Mack Thurmon is a 51 year old female with depression and panic disorder who presents today for medication management. It appears that she was having rebound anxiety in the context of being treated with lorazepam twice daily. She is maintained on Celexa 40 mg daily for her depressive symptoms, with fair results. In the context of her primary care attempting to taper her off of lorazepam, she has had increasing anxiety and panic. She has also increased her use of alcohol to quell her symptoms of anxiety. We'll proceed as below, and ultimately likely consider switch to a more potent SSRI to eventually taper her  off of clonazepam. However for the time being low-dose clonazepam would be appropriate given her symptoms of severe anxiety and panic episodes. She does not present with any behaviors concerning for drug-seeking, and does not present with any history of significant substance abuse except for the most recent 2 weeks with her increased anxiety.  # MDD with anxious features, Panic Disorder STOP Valium STOP Ativan Start Clonazepam 0.5 mg Twice daily ( Am and Pm) Continue Celexa 40 mg daily Come back in 2 months for follow-up  Aundra Dubin, MD 3/7/20181:46 PM

## 2016-12-07 NOTE — Patient Instructions (Signed)
STOP Valium  STOP Ativan  Start Clonazepam 0.5 mg Twice daily ( Am and Pm)  Continue celexa   Come back in 2 months for follow-up

## 2016-12-29 ENCOUNTER — Other Ambulatory Visit (HOSPITAL_COMMUNITY): Payer: Self-pay | Admitting: Psychiatry

## 2016-12-29 ENCOUNTER — Telehealth (HOSPITAL_COMMUNITY): Payer: Self-pay

## 2016-12-29 DIAGNOSIS — F410 Panic disorder [episodic paroxysmal anxiety] without agoraphobia: Secondary | ICD-10-CM

## 2016-12-29 MED ORDER — HYDROXYZINE PAMOATE 25 MG PO CAPS: 25.0000 mg | ORAL_CAPSULE | Freq: Every day | ORAL | 0 refills | Status: DC | PRN

## 2016-12-29 NOTE — Telephone Encounter (Signed)
Im calling her now to see whats up

## 2016-12-29 NOTE — Telephone Encounter (Signed)
Patient says that she is taking her Klonopin 0.5 mg 1 po bid. She said she is still having some anxiety in the morning, and she has been having diarrhea. Please review and advise, thank you

## 2017-01-04 ENCOUNTER — Telehealth: Payer: Self-pay | Admitting: Physician Assistant

## 2017-01-04 NOTE — Telephone Encounter (Signed)
Pt needs to speak with Carlis Abbott about a medication that she was put on by [facility], clonazePAM (KLONOPIN) 0.5 MG tablet [[id number]]  Please advise: [phone]

## 2017-01-11 NOTE — Telephone Encounter (Signed)
Pt calling back, would still like a call to discuss this. Thanks!

## 2017-01-14 NOTE — Telephone Encounter (Signed)
[phone] Pt called again seeing if Natalie Richard could return her call and that she is

## 2017-01-16 NOTE — Telephone Encounter (Signed)
Patient called in today again wanting to talk to Philis Fendt and very upset that he had not called her back since she put a message in to him on 01/04/17. I read the message to her that he had left this morning 01/16/17 and she was not happy with that either. She wants him to call her back ASAP at [phone].

## 2017-01-16 NOTE — Telephone Encounter (Signed)
We will need to talk about this at her follow up.  For now I suggest we go with psych's plan. Philis Fendt, MS, PA-C 8:28 AM, 01/16/2017

## 2017-01-18 ENCOUNTER — Telehealth (HOSPITAL_COMMUNITY): Payer: Self-pay

## 2017-01-18 ENCOUNTER — Ambulatory Visit: Payer: BLUE CROSS/BLUE SHIELD

## 2017-01-18 DIAGNOSIS — F410 Panic disorder [episodic paroxysmal anxiety] without agoraphobia: Secondary | ICD-10-CM

## 2017-01-18 MED ORDER — HYDROXYZINE PAMOATE 50 MG PO CAPS: 50.0000 mg | ORAL_CAPSULE | Freq: Three times a day (TID) | ORAL | 0 refills | Status: DC | PRN

## 2017-01-18 NOTE — Telephone Encounter (Signed)
That's awful, im so sorry! We can double the vistaril to 50 mg, and use up to 3 times a day as needed

## 2017-01-18 NOTE — Telephone Encounter (Signed)
I sent the new order to the pharmacy and called patient to let her know. I told patient to call back with any questions or concerns.

## 2017-01-18 NOTE — Telephone Encounter (Signed)
Medication management - Called patient back to discuss message she left that her cat suddenly died yesterday with cardiac issue.  Patient states her "nerves are shot". States took one Hydroxyzine today as is getting diarrhea some with anxiousness and questions if she should take anything else short-term to assist with emotions currently.  States understands some of this is normal grief but just wants to know what Dr. Daron Offer recommends for increased anxiety and current symptoms.  Agreed to send message to Dr. Daron Offer to follow up.

## 2017-01-26 ENCOUNTER — Ambulatory Visit: Payer: Self-pay | Admitting: Family Medicine

## 2017-01-30 ENCOUNTER — Ambulatory Visit (INDEPENDENT_AMBULATORY_CARE_PROVIDER_SITE_OTHER): Payer: BLUE CROSS/BLUE SHIELD | Admitting: Family Medicine

## 2017-01-30 VITALS — BP 132/82 | HR 88 | Temp 98.9°F | Ht 66.0 in | Wt 242.8 lb

## 2017-01-30 DIAGNOSIS — Z1329 Encounter for screening for other suspected endocrine disorder: Secondary | ICD-10-CM | POA: Diagnosis not present

## 2017-01-30 DIAGNOSIS — I10 Essential (primary) hypertension: Secondary | ICD-10-CM | POA: Diagnosis not present

## 2017-01-30 DIAGNOSIS — E119 Type 2 diabetes mellitus without complications: Secondary | ICD-10-CM

## 2017-01-30 DIAGNOSIS — Z130 Encounter for screening for diseases of the blood and blood-forming organs and certain disorders involving the immune mechanism: Secondary | ICD-10-CM | POA: Diagnosis not present

## 2017-01-30 DIAGNOSIS — K219 Gastro-esophageal reflux disease without esophagitis: Secondary | ICD-10-CM

## 2017-01-30 DIAGNOSIS — E785 Hyperlipidemia, unspecified: Secondary | ICD-10-CM

## 2017-01-30 DIAGNOSIS — R740 Nonspecific elevation of levels of transaminase and lactic acid dehydrogenase [LDH]: Secondary | ICD-10-CM

## 2017-01-30 DIAGNOSIS — R7401 Elevation of levels of liver transaminase levels: Secondary | ICD-10-CM

## 2017-01-30 LAB — COMPREHENSIVE METABOLIC PANEL
ALBUMIN: 4.2 g/dL (ref 3.5–5.2)
ALT: 98 U/L — ABNORMAL HIGH (ref 0–35)
AST: 142 U/L — AB (ref 0–37)
Alkaline Phosphatase: 114 U/L (ref 39–117)
BUN: 11 mg/dL (ref 6–23)
CHLORIDE: 99 meq/L (ref 96–112)
CO2: 28 meq/L (ref 19–32)
CREATININE: 0.84 mg/dL (ref 0.40–1.20)
Calcium: 9.9 mg/dL (ref 8.4–10.5)
GFR: 75.51 mL/min (ref >60.00)
GLUCOSE: 166 mg/dL — AB (ref 70–99)
Potassium: 3.6 mEq/L (ref 3.5–5.1)
SODIUM: 138 meq/L (ref 135–145)
Total Bilirubin: 0.3 mg/dL (ref 0.2–1.2)
Total Protein: 7.5 g/dL (ref 6.0–8.3)

## 2017-01-30 LAB — CBC
HEMATOCRIT: 43.1 % (ref 36.0–46.0)
Hemoglobin: 14.2 g/dL (ref 12.0–15.0)
MCHC: 32.9 g/dL (ref 30.0–36.0)
MCV: 84.8 fl (ref 78.0–100.0)
Platelets: 377 10*3/uL (ref 150.0–400.0)
RBC: 5.08 Mil/uL (ref 3.87–5.11)
RDW: 16.1 % — AB (ref 11.5–15.5)
WBC: 10 10*3/uL (ref 4.0–10.5)

## 2017-01-30 LAB — LIPID PANEL
CHOL/HDL RATIO: 3
Cholesterol: 198 mg/dL (ref 0–200)
HDL: 73 mg/dL (ref >39.00)
NONHDL: 125.26
TRIGLYCERIDES: 213 mg/dL — AB (ref 0.0–149.0)
VLDL: 42.6 mg/dL — AB (ref 0.0–40.0)

## 2017-01-30 LAB — LDL CHOLESTEROL, DIRECT: Direct LDL: 99 mg/dL

## 2017-01-30 LAB — HEMOGLOBIN A1C: Hgb A1c MFr Bld: 7.9 % — ABNORMAL HIGH (ref 4.6–6.5)

## 2017-01-30 LAB — TSH: TSH: 2.66 u[IU]/mL (ref 0.35–4.50)

## 2017-01-30 MED ORDER — HYDROCHLOROTHIAZIDE 12.5 MG PO CAPS: 12.5000 mg | ORAL_CAPSULE | Freq: Every day | ORAL | 3 refills | Status: DC

## 2017-01-30 MED ORDER — DEXLANSOPRAZOLE 60 MG PO CPDR: 1.0000 | DELAYED_RELEASE_CAPSULE | Freq: Every day | ORAL | 3 refills | Status: DC

## 2017-01-30 MED ORDER — PIOGLITAZONE HCL 30 MG PO TABS: 30.0000 mg | ORAL_TABLET | Freq: Every day | ORAL | 3 refills | Status: DC

## 2017-01-30 MED ORDER — ROSUVASTATIN CALCIUM 40 MG PO TABS: ORAL_TABLET | ORAL | 3 refills | Status: DC

## 2017-01-30 NOTE — Patient Instructions (Addendum)
It was good to see you again today- take care and I will be in touch with your labs asap I am so sorry for the loss of your mom- and also of your cat!

## 2017-01-30 NOTE — Progress Notes (Addendum)
[place] [facility] [address], [place], [place] [address] [phone]  Date:  01/30/2017   Name:  Natalie Richard   DOB:  [birth year]   MRN:  [id number]  PCP:  Kathlen Brunswick, PA-C    Chief Complaint: Establish Care   History of Present Illness:  Natalie Richard is a 51 y.o. very pleasant female patient who presents with the following:  I have seen this pt at [facility]- last visit about 2 years ago. First visit with my [place] office In the more recent past she saw Natalie Fendt, PA-C about tying to come off benzos. The plan was for her to see psychiatry; Dr. Sharyon Medicus.  She was seen once so far- they have her using klonopin BID, and also celexa. They have also given her hydroxyzine She is seeing Dr. Sharyon Medicus again in about 2 weeks and feels that she is doing well under his care  She had seen a counselor but they did not hit it off.  Natalie Richard reports that around the time of her death, her mother became quite short of breath and Natalie Richard just could not watch as it was too distressing for her. She asked her aunt to take over, and her aunt stayed with her mom until she died.  Natalie Richard reports that her counselor said "don't you think that your mother would have wanted you to be there" or something to that effect which was very upsetting to [facility]. Reassured Natalie Richard that her mother would not have wanted her to be in undue distress and that I am sure she knew Natalie Richard cared about her very much.  She expressed appreciation  History of diabetes, HTN, obesity, NASH She is on actos, she is out of crestor. Has been out of her crestor for a few days now She is on hctz for her BP.   Wt Readings from Last 3 Encounters:  01/30/17 242 lb 12.8 oz (110.1 kg)  11/14/16 246 lb 6.4 oz (111.8 kg)  October 11, 2016 246 lb (111.6 kg)   BP Readings from Last 3 Encounters:  01/30/17 132/82  11/14/16 135/83  2016/10/11 126/80   Her mother died in 2015-10-31, and her  cat died 2 weeks ago She had been a pt of Laney Pastor- he is now retired so she is seeking a new PCP  She ate a banana and had a pepsi so far today  Dr. Collene Mares has recommended that she stay on her dexilant as she had some esophagus changes- ? Barrett's esophagus  Lab Results  Component Value Date   HGBA1C 7.7 04/08/2016    Patient Active Problem List   Diagnosis Date Noted  . NASH (nonalcoholic steatohepatitis) 07/26/2015  . BMI 39.0-39.9,adult 03/19/2012  . Hematoma 03/19/2012  . HTN (hypertension), benign 01/09/2012  . Osteopenia 01/09/2012  . Migraines 01/09/2012  . Insomnia 01/09/2012  . Anxiety and depression 01/09/2012  . Hyperlipidemia 01/09/2012  . Diabetes mellitus 01/09/2012  . Allergic rhinitis 01/09/2012    Past Medical History:  Diagnosis Date  . Anxiety   . 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 Depression   . Diabetes mellitus   . Diabetes mellitus, type II ([place])   . GERD (gastroesophageal reflux disease)   . Hematoma    on buttock  . Hyperlipidemia   . Hypertension   . Migraine   . Seasonal allergies     Past Surgical History:  Procedure Laterality Date  . ABDOMINAL  HYSTERECTOMY  2004   PCOS  . EYE SURGERY    . INNER EAR SURGERY Left 08/2016    Social History  Substance Use Topics  . Smoking status: Never Smoker  . Smokeless tobacco: Never Used  . Alcohol use 4.8 - 6.0 oz/week    6 Cans of beer, 2 Shots of liquor per week     Comment: weekly    Family History  Problem Relation Age of Onset  . Cancer Mother 61    colon  . Diabetes Mother   . Rheum arthritis Mother   . Dementia Mother   . Depression Mother   . Cancer Father 39    bone and prostate  . Cancer Maternal Grandmother     breast  . Diabetes Maternal Grandfather   . Depression Cousin     Allergies  Allergen Reactions  . Buspar [Buspirone] Other (See Comments)    Lip swelling and rash numbness   . Augmentin [Amoxicillin-Pot Clavulanate] Nausea And Vomiting   . Biaxin [Clarithromycin]   . Ciprofloxacin     Developed itching with IV form in the hospital, but has since taken PO without a problem  . Doxycycline Itching  . Levaquin [Levofloxacin] Hives  . Metformin And Related Nausea And Vomiting  . Pyridium [Phenazopyridine Hcl]     Does not remember reaction  . Sulfa Antibiotics     As a younger person had itching, but has taken more recently and did ok  . Lorabid [Loracarbef] Rash    Medication list has been reviewed and updated.  Current Outpatient Prescriptions on File Prior to Visit  Medication Sig Dispense Refill  . citalopram (CELEXA) 40 MG tablet Take 1 tablet (40 mg total) by mouth daily. 90 tablet 0  . clonazePAM (KLONOPIN) 0.5 MG tablet Take 1 tablet (0.5 mg total) by mouth 2 (two) times daily. 60 tablet 1  . dexlansoprazole (DEXILANT) 60 MG capsule Take 1 capsule (60 mg total) by mouth daily. 90 capsule 3  . folic acid (FOLVITE) 1 MG tablet TAKE 1 TABLET (1 MG TOTAL) BY MOUTH DAILY 90 tablet 3  . hydrochlorothiazide (MICROZIDE) 12.5 MG capsule Take 1 capsule (12.5 mg total) by mouth daily. 90 capsule 1  . hydrOXYzine (VISTARIL) 50 MG capsule Take 1 capsule (50 mg total) by mouth 3 (three) times daily as needed. 90 capsule 0  . loratadine (CLARITIN) 10 MG tablet Take 1 tablet (10 mg total) by mouth daily. 90 tablet 3  . Multiple Vitamins-Minerals (MULTIVITAMIN WITH MINERALS) tablet Take 1 tablet by mouth daily.    . Omega-3 Fatty Acids (FISH OIL) 1000 MG CAPS Take by mouth.    . pioglitazone (ACTOS) 30 MG tablet Take 1 tablet (30 mg total) by mouth daily. 90 tablet 1  . rosuvastatin (CRESTOR) 40 MG tablet TAKE 1 TABLET (40 MG TOTAL) BY MOUTH DAILY. 90 tablet 1  . S-Adenosylmethionine (SAME PO) Take by mouth.     No current facility-administered medications on file prior to visit.     Review of Systems:  As per HPI- otherwise negative. No fever, chills, nausea, vomiting, diarrhea, rash, CP or SOB   Physical  Examination: Vitals:   01/30/17 1328  BP: 132/82  Pulse: 88  Temp: 98.9 F (37.2 C)   Vitals:   01/30/17 1328  Weight: 242 lb 12.8 oz (110.1 kg)  Height: 5' 6"  (1.676 m)   Body mass index is 39.19 kg/m. Ideal Body Weight: Weight in (lb) to have BMI = 25: 154.6  GEN: WDWN, NAD, Non-toxic, A & O x 3, obese, otherwise looks well HEENT: Atraumatic, Normocephalic. Neck supple. No masses, No LAD. Ears and Nose: No external deformity. CV: RRR, No M/G/R. No JVD. No thrill. No extra heart sounds. PULM: CTA B, no wheezes, crackles, rhonchi. No retractions. No resp. distress. No accessory muscle use. ABD: S, NT, ND EXTR: No c/c/e NEURO Normal gait.  PSYCH: Normally interactive. Conversant. Not depressed or anxious appearing.  Calm demeanor.    Assessment and Plan: Type 2 diabetes mellitus without complication, without long-term current use of insulin ([place]) - Plan: Comprehensive metabolic panel, Hemoglobin A1c, pioglitazone (ACTOS) 30 MG tablet  Screening for thyroid disorder - Plan: TSH  Dyslipidemia - Plan: Lipid panel, rosuvastatin (CRESTOR) 40 MG tablet  Screening for deficiency anemia - Plan: CBC  Gastroesophageal reflux disease, esophagitis presence not specified - Plan: dexlansoprazole (DEXILANT) 60 MG capsule  Essential hypertension - Plan: hydrochlorothiazide (MICROZIDE) 12.5 MG capsule  Labs and refills today Will plan further follow- up pending labs. Will plan her next visit pending her labs BP under ok control Continue to see her psychiatrist as planned   Signed Lamar Blinks, MD  Received her labs 5/2 and gave her a call Her LFTs are up- she denies drinking much alcohol and is not taking tylenol Her LFTs have been up in the past but not this high- last year AST 56 and ALT 75 Last liver US in 2014- fatty liver Negative Hep C in 2015  A1c is too high- will add an SGLT2 to her regimen   Results for orders placed or performed in visit on 01/30/17  CBC   Result Value Ref Range   WBC 10.0 4.0 - 10.5 K/uL   RBC 5.08 3.87 - 5.11 Mil/uL   Platelets 377.0 150.0 - 400.0 K/uL   Hemoglobin 14.2 12.0 - 15.0 g/dL   HCT 43.1 36.0 - 46.0 %   MCV 84.8 78.0 - 100.0 fl   MCHC 32.9 30.0 - 36.0 g/dL   RDW 16.1 (H) 11.5 - 15.5 %  Comprehensive metabolic panel  Result Value Ref Range   Sodium 138 135 - 145 mEq/L   Potassium 3.6 3.5 - 5.1 mEq/L   Chloride 99 96 - 112 mEq/L   CO2 28 19 - 32 mEq/L   Glucose, Bld 166 (H) 70 - 99 mg/dL   BUN 11 6 - 23 mg/dL   Creatinine, Ser 0.84 0.40 - 1.20 mg/dL   Total Bilirubin 0.3 0.2 - 1.2 mg/dL   Alkaline Phosphatase 114 39 - 117 U/L   AST 142 (H) 0 - 37 U/L   ALT 98 (H) 0 - 35 U/L   Total Protein 7.5 6.0 - 8.3 g/dL   Albumin 4.2 3.5 - 5.2 g/dL   Calcium 9.9 8.4 - 10.5 mg/dL   GFR 75.51 >60.00 mL/min  Hemoglobin A1c  Result Value Ref Range   Hgb A1c MFr Bld 7.9 (H) 4.6 - 6.5 %  TSH  Result Value Ref Range   TSH 2.66 0.35 - 4.50 uIU/mL  Lipid panel  Result Value Ref Range   Cholesterol 198 0 - 200 mg/dL   Triglycerides 213.0 (H) 0.0 - 149.0 mg/dL   HDL 73.00 >39.00 mg/dL   VLDL 42.6 (H) 0.0 - 40.0 mg/dL   Total CHOL/HDL Ratio 3    NonHDL 125.26   LDL cholesterol, direct  Result Value Ref Range   Direct LDL 99.0 mg/dL

## 2017-01-30 NOTE — Progress Notes (Signed)
Pre visit review using our clinic tool,if applicable. No additional management support is needed unless otherwise documented below in the visit note.  

## 2017-02-01 ENCOUNTER — Encounter: Payer: Self-pay | Admitting: Family Medicine

## 2017-02-01 MED ORDER — DAPAGLIFLOZIN PROPANEDIOL 5 MG PO TABS: 5.0000 mg | ORAL_TABLET | Freq: Every day | ORAL | 5 refills | Status: DC

## 2017-02-01 NOTE — Addendum Note (Signed)
Addended by: Lamar Blinks C on: 02/01/2017 08:38 AM   Modules accepted: Orders

## 2017-02-02 ENCOUNTER — Other Ambulatory Visit (HOSPITAL_COMMUNITY): Payer: Self-pay

## 2017-02-02 ENCOUNTER — Telehealth: Payer: Self-pay | Admitting: Family Medicine

## 2017-02-02 DIAGNOSIS — F3341 Major depressive disorder, recurrent, in partial remission: Secondary | ICD-10-CM

## 2017-02-02 DIAGNOSIS — F410 Panic disorder [episodic paroxysmal anxiety] without agoraphobia: Secondary | ICD-10-CM

## 2017-02-02 MED ORDER — CLONAZEPAM 0.5 MG PO TABS: 0.5000 mg | ORAL_TABLET | Freq: Two times a day (BID) | ORAL | 0 refills | Status: DC

## 2017-02-02 NOTE — Telephone Encounter (Signed)
Pt says that pcp started her on a new medication FARXIGA yesterday. It is causing dizziness and headaches. Pt would like to be advised by PCP, is this normal? Should she give it more time?   Please advise.    CB: [phone]

## 2017-02-02 NOTE — Progress Notes (Signed)
Patient called because she is going to be out of her Klonopin before her appointment on 5/14, I called in a 15 day supply to her pharmacy to get her to her follow up. I called patient and she is aware.

## 2017-02-03 ENCOUNTER — Other Ambulatory Visit (HOSPITAL_BASED_OUTPATIENT_CLINIC_OR_DEPARTMENT_OTHER): Payer: Self-pay

## 2017-02-03 NOTE — Telephone Encounter (Signed)
Called and checked on her- she reports that she took the [place] again last night and tolerated it better.  She will let me know if she does not continue to do ok on this medication

## 2017-02-08 ENCOUNTER — Other Ambulatory Visit (HOSPITAL_BASED_OUTPATIENT_CLINIC_OR_DEPARTMENT_OTHER): Payer: Self-pay

## 2017-02-13 ENCOUNTER — Ambulatory Visit (INDEPENDENT_AMBULATORY_CARE_PROVIDER_SITE_OTHER): Payer: BLUE CROSS/BLUE SHIELD | Admitting: Psychiatry

## 2017-02-13 DIAGNOSIS — F410 Panic disorder [episodic paroxysmal anxiety] without agoraphobia: Secondary | ICD-10-CM | POA: Diagnosis not present

## 2017-02-13 DIAGNOSIS — Z810 Family history of intellectual disabilities: Secondary | ICD-10-CM

## 2017-02-13 DIAGNOSIS — Z818 Family history of other mental and behavioral disorders: Secondary | ICD-10-CM | POA: Diagnosis not present

## 2017-02-13 DIAGNOSIS — Z79899 Other long term (current) drug therapy: Secondary | ICD-10-CM

## 2017-02-13 DIAGNOSIS — F3341 Major depressive disorder, recurrent, in partial remission: Secondary | ICD-10-CM

## 2017-02-13 MED ORDER — CLONAZEPAM 0.5 MG PO TABS: 0.5000 mg | ORAL_TABLET | Freq: Two times a day (BID) | ORAL | 0 refills | Status: DC

## 2017-02-13 MED ORDER — CLONAZEPAM 0.5 MG PO TABS: 0.5000 mg | ORAL_TABLET | Freq: Two times a day (BID) | ORAL | 3 refills | Status: DC

## 2017-02-13 MED ORDER — CITALOPRAM HYDROBROMIDE 40 MG PO TABS: 40.0000 mg | ORAL_TABLET | Freq: Every day | ORAL | 0 refills | Status: DC
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
## 2017-02-20 ENCOUNTER — Encounter: Payer: Self-pay | Admitting: Internal Medicine

## 2017-02-20 ENCOUNTER — Ambulatory Visit (INDEPENDENT_AMBULATORY_CARE_PROVIDER_SITE_OTHER): Payer: BLUE CROSS/BLUE SHIELD | Admitting: Internal Medicine

## 2017-02-20 VITALS — BP 128/78 | HR 81 | Temp 97.7°F | Resp 14 | Ht 66.0 in | Wt 245.2 lb

## 2017-02-20 DIAGNOSIS — H9209 Otalgia, unspecified ear: Secondary | ICD-10-CM | POA: Diagnosis not present

## 2017-02-20 DIAGNOSIS — J302 Other seasonal allergic rhinitis: Secondary | ICD-10-CM

## 2017-02-20 MED ORDER — AZELASTINE HCL 0.1 % NA SOLN: 2.0000 | Freq: Two times a day (BID) | NASAL | 6 refills | Status: DC

## 2017-02-20 NOTE — Progress Notes (Signed)
Pre visit review using our clinic review tool, if applicable. No additional management support is needed unless otherwise documented below in the visit note. 
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
## 2017-02-20 NOTE — Patient Instructions (Signed)
Continue with your steroid nasal spray and  Claritin  Also start using Astelin 2 sprays on each side of the nose twice a day  Call in 7-10 days if you are not gradually improving.  Call if you have severe symptoms, fever or chills.

## 2017-02-22 ENCOUNTER — Ambulatory Visit: Payer: Self-pay | Admitting: Family Medicine

## 2017-03-06 ENCOUNTER — Telehealth: Payer: Self-pay | Admitting: Family Medicine

## 2017-03-06 MED ORDER — PREDNISONE 10 MG PO TABS: 10.0000 mg | ORAL_TABLET | Freq: Two times a day (BID) | ORAL | 0 refills | Status: DC

## 2017-03-06 NOTE — Telephone Encounter (Signed)
Spoke w/ Pt, informed of recommendations. Rx sent to [facility].

## 2017-03-06 NOTE — Telephone Encounter (Signed)
Relation to VV:YXAJ Call back number: [phone] (H) [id number] [phone](Fax)    Reason for call:  Patient was last seen 02/20/17 and patient states Dr. PLarose Kellsadvised if symptoms don't improve he will send alternate Rx, patient experiencing pain in ear, coughing green mucus, please advise

## 2017-03-06 NOTE — Telephone Encounter (Signed)
Per my note, if she is not better we could try low-dose of steroids. Send prednisone 10 mg 2 tablets daily for 5 days #10 no refills. Watch for increase blood sugars, if they are more than 250 stop prednisone. Office visit if not improving

## 2017-03-06 NOTE — Telephone Encounter (Signed)
Please advise 

## 2017-03-28 ENCOUNTER — Other Ambulatory Visit: Payer: Self-pay | Admitting: Family Medicine

## 2017-03-29 ENCOUNTER — Other Ambulatory Visit: Payer: Self-pay | Admitting: Emergency Medicine

## 2017-03-29 MED ORDER — FOLIC ACID 1 MG PO TABS: ORAL_TABLET | ORAL | 3 refills | Status: DC

## 2017-04-01 NOTE — Progress Notes (Deleted)
[place] [facility] [address] [place], [place] [address] [phone]  Date:  04/03/2017   Name:  NAJIYAH PARIS   DOB:  [birth year]   MRN:  [id number]  PCP:  Darreld Mclean, MD    Chief Complaint: No chief complaint on file.   History of Present Illness:  CICILIA CLINGER is a 51 y.o. very pleasant female patient who presents with the following:  Here today for a followup visit History of obesity, HTN, hyperlipidemia, DM She has started seeing a psychistrist for her mental health concerns. LFts were up at last visit- she does have a dx of NASH She is due for a urine microalbumin and and also need pneumonia vaccine    Lab Results  Component Value Date   HGBA1C 7.9 (H) 01/30/2017     Patient Active Problem List   Diagnosis Date Noted  . NASH (nonalcoholic steatohepatitis) 07/26/2015  . BMI 39.0-39.9,adult 03/19/2012  . Hematoma 03/19/2012  . HTN (hypertension), benign 01/09/2012  . Osteopenia 01/09/2012  . Migraines 01/09/2012  . Insomnia 01/09/2012  . Anxiety and depression 01/09/2012  . Hyperlipidemia 01/09/2012  . Diabetes mellitus 01/09/2012  . Allergic rhinitis 01/09/2012    Past Medical History:  Diagnosis Date  . Anxiety   . 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 Depression   . Diabetes mellitus   . Diabetes mellitus, type II ([place])   . GERD (gastroesophageal reflux disease)   . Hematoma    on buttock  . Hyperlipidemia   . Hypertension   . Migraine   . Seasonal allergies     Past Surgical History:  Procedure Laterality Date  . ABDOMINAL HYSTERECTOMY  2004   PCOS  . EYE SURGERY    . INNER EAR SURGERY Left 08/2016    Social History  Substance Use Topics  . Smoking status: Never Smoker  . Smokeless tobacco: Never Used  . Alcohol use 4.8 - 6.0 oz/week    6 Cans of beer, 2 Shots of liquor per week     Comment: weekly    Family History  Problem Relation Age of  Onset  . Cancer Mother 57       colon  . Diabetes Mother   . Rheum arthritis Mother   . Dementia Mother   . Depression Mother   . Cancer Father 32       bone and prostate  . Cancer Maternal Grandmother        breast  . Diabetes Maternal Grandfather   . Depression Cousin     Allergies  Allergen Reactions  . Buspar [Buspirone] Other (See Comments)    Lip swelling and rash numbness   . Augmentin [Amoxicillin-Pot Clavulanate] Nausea And Vomiting  . Biaxin [Clarithromycin]   . Ciprofloxacin     Developed itching with IV form in the hospital, but has since taken PO without a problem  . Doxycycline Itching  . Levaquin [Levofloxacin] Hives  . Metformin And Related Nausea And Vomiting  . Pyridium [Phenazopyridine Hcl]     Does not remember reaction  . Sulfa Antibiotics     As a younger person had itching, but has taken more recently and did ok  . Latex Rash  . Lorabid [Loracarbef] Rash    Medication list has been reviewed and updated.  Current Outpatient Prescriptions on File Prior to Visit  Medication Sig Dispense Refill  . azelastine (ASTELIN) 0.1 %  nasal spray Place 2 sprays into both nostrils 2 (two) times daily. 30 mL 6  . citalopram (CELEXA) 40 MG tablet Take 1 tablet (40 mg total) by mouth daily. 90 tablet 0  . clonazePAM (KLONOPIN) 0.5 MG tablet Take 1 tablet (0.5 mg total) by mouth 2 (two) times daily. 180 tablet 3  . dapagliflozin propanediol (FARXIGA) 5 MG TABS tablet Take 5 mg by mouth daily. 30 tablet 5  . dexlansoprazole (DEXILANT) 60 MG capsule Take 1 capsule (60 mg total) by mouth daily. 90 capsule 3  . folic acid (FOLVITE) 1 MG tablet TAKE 1 TABLET (1 MG TOTAL) BY MOUTH DAILY 90 tablet 3  . hydrochlorothiazide (MICROZIDE) 12.5 MG capsule Take 1 capsule (12.5 mg total) by mouth daily. 90 capsule 3  . hydrOXYzine (VISTARIL) 50 MG capsule Take 1 capsule (50 mg total) by mouth 3 (three) times daily as needed. 90 capsule 0  . loratadine (CLARITIN) 10 MG tablet Take 1  tablet (10 mg total) by mouth daily. 90 tablet 3  . Multiple Vitamins-Minerals (MULTIVITAMIN WITH MINERALS) tablet Take 1 tablet by mouth daily.    . Omega-3 Fatty Acids (FISH OIL) 1000 MG CAPS Take by mouth.    . pioglitazone (ACTOS) 30 MG tablet Take 1 tablet (30 mg total) by mouth daily. 90 tablet 3  . predniSONE (DELTASONE) 10 MG tablet Take 1 tablet (10 mg total) by mouth 2 (two) times daily with a meal. 10 tablet 0  . rosuvastatin (CRESTOR) 40 MG tablet TAKE 1 TABLET (40 MG TOTAL) BY MOUTH DAILY. 90 tablet 3  . S-Adenosylmethionine (SAME PO) Take by mouth.     No current facility-administered medications on file prior to visit.     Review of Systems:  ***  Physical Examination: There were no vitals filed for this visit. There were no vitals filed for this visit. There is no height or weight on file to calculate BMI. Ideal Body Weight:    ***  Assessment and Plan: ***  Signed Lamar Blinks, MD

## 2017-04-03 ENCOUNTER — Ambulatory Visit: Payer: Self-pay | Admitting: Family Medicine

## 2017-04-03 ENCOUNTER — Telehealth: Payer: Self-pay | Admitting: Family Medicine

## 2017-04-03 DIAGNOSIS — Z0289 Encounter for other administrative examinations: Secondary | ICD-10-CM

## 2017-04-03 NOTE — Telephone Encounter (Signed)
Caller name:Areen Meester Relationship to patient: Can be reached: Pharmacy:  Reason for call:Patient is unable to come to appointment today, she states she has a migraine. Charge NOS?

## 2017-04-03 NOTE — Telephone Encounter (Signed)
No charge. 

## 2017-04-18 NOTE — Progress Notes (Addendum)
[facility] [address], [place], [place] [address] [phone]  Date:  04/20/2017   Name:  Natalie Richard   DOB:  [birth year]   MRN:  [id number]  PCP:  Darreld Mclean, MD    Chief Complaint: Follow-up (Pt here for f/u visit. )   History of Present Illness:  Natalie Richard is a 51 y.o. very pleasant female patient who presents with the following:  History of obesity, HTN, DM, NASH Here today for a follow-up visit Last seen here in April to follow-up on her DM. At her last visit she was having a hard time as her mother passed away in 11-08-2022 of this year, and her cat also died.    We added farxiga as her A1c was high- she seems to be tolerating this well and has not noted any SE She is also on actos She has not been able to tolerate metformin  She did not end up getting her liver ultrasound as she was too busy. We will check her LFTs again today Tomorrow she is having a procedure on her ear to try and cure her ETD Due for a urine micro today  She never had a pneumonia vaccine- she prefers to do at her next visit   She just got a new kitten- she is really enjoying having a cat again. Her sister found stray kittens so they are raising them   She has noted possible early UTI sx with some burning and frequency.  Will check a UA for her today as well  Lab Results  Component Value Date   HGBA1C 7.9 (H) 01/30/2017   Wt Readings from Last 3 Encounters:  04/20/17 241 lb (109.3 kg)  02/20/17 245 lb 4 oz (111.2 kg)  01/30/17 242 lb 12.8 oz (110.1 kg)   BP Readings from Last 3 Encounters:  04/20/17 124/88  02/20/17 128/78  01/30/17 132/82     Patient Active Problem List   Diagnosis Date Noted  . NASH (nonalcoholic steatohepatitis) 07/26/2015  . BMI 39.0-39.9,adult 03/19/2012  . Hematoma 03/19/2012  . HTN (hypertension), benign 01/09/2012  . Osteopenia 01/09/2012  . Migraines 01/09/2012  . Insomnia  01/09/2012  . Anxiety and depression 01/09/2012  . Hyperlipidemia 01/09/2012  . Diabetes mellitus 01/09/2012  . Allergic rhinitis 01/09/2012    Past Medical History:  Diagnosis Date  . Anxiety   . 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 Depression   . Diabetes mellitus   . Diabetes mellitus, type II ([place])   . GERD (gastroesophageal reflux disease)   . Hematoma    on buttock  . Hyperlipidemia   . Hypertension   . Migraine   . Seasonal allergies     Past Surgical History:  Procedure Laterality Date  . ABDOMINAL HYSTERECTOMY  2004   PCOS  . EYE SURGERY    . INNER EAR SURGERY Left 08/2016    Social History  Substance Use Topics  . Smoking status: Never Smoker  . Smokeless tobacco: Never Used  . Alcohol use 4.8 - 6.0 oz/week    6 Cans of beer, 2 Shots of liquor per week     Comment: weekly    Family History  Problem Relation Age of Onset  . Cancer Mother 61       colon  . Diabetes Mother   . Rheum arthritis Mother   . Dementia Mother   .  Depression Mother   . Cancer Father 86       bone and prostate  . Cancer Maternal Grandmother        breast  . Diabetes Maternal Grandfather   . Depression Cousin     Allergies  Allergen Reactions  . Buspar [Buspirone] Other (See Comments)    Lip swelling and rash numbness   . Augmentin [Amoxicillin-Pot Clavulanate] Nausea And Vomiting  . Biaxin [Clarithromycin]   . Ciprofloxacin     Developed itching with IV form in the hospital, but has since taken PO without a problem  . Doxycycline Itching  . Levaquin [Levofloxacin] Hives  . Metformin And Related Nausea And Vomiting  . Pyridium [Phenazopyridine Hcl]     Does not remember reaction  . Sulfa Antibiotics     As a younger person had itching, but has taken more recently and did ok  . Latex Rash  . Lorabid [Loracarbef] Rash    Medication list has been reviewed and updated.  Current Outpatient Prescriptions on File Prior to Visit  Medication Sig  Dispense Refill  . clonazePAM (KLONOPIN) 0.5 MG tablet Take 1 tablet (0.5 mg total) by mouth 2 (two) times daily. (Patient taking differently: Take 0.25 mg by mouth 2 (two) times daily. ) 180 tablet 3  . azelastine (ASTELIN) 0.1 % nasal spray Place 2 sprays into both nostrils 2 (two) times daily. (Patient taking differently: Place 2 sprays into both nostrils as needed. ) 30 mL 6  . citalopram (CELEXA) 40 MG tablet Take 1 tablet (40 mg total) by mouth daily. 90 tablet 0  . dapagliflozin propanediol (FARXIGA) 5 MG TABS tablet Take 5 mg by mouth daily. 30 tablet 5  . dexlansoprazole (DEXILANT) 60 MG capsule Take 1 capsule (60 mg total) by mouth daily. 90 capsule 3  . folic acid (FOLVITE) 1 MG tablet TAKE 1 TABLET (1 MG TOTAL) BY MOUTH DAILY 90 tablet 3  . hydrochlorothiazide (MICROZIDE) 12.5 MG capsule Take 1 capsule (12.5 mg total) by mouth daily. 90 capsule 3  . hydrOXYzine (VISTARIL) 50 MG capsule Take 1 capsule (50 mg total) by mouth 3 (three) times daily as needed. 90 capsule 0  . loratadine (CLARITIN) 10 MG tablet Take 1 tablet (10 mg total) by mouth daily. 90 tablet 3  . Multiple Vitamins-Minerals (MULTIVITAMIN WITH MINERALS) tablet Take 1 tablet by mouth daily.    . Omega-3 Fatty Acids (FISH OIL) 1000 MG CAPS Take by mouth.    . pioglitazone (ACTOS) 30 MG tablet Take 1 tablet (30 mg total) by mouth daily. 90 tablet 3  . rosuvastatin (CRESTOR) 40 MG tablet TAKE 1 TABLET (40 MG TOTAL) BY MOUTH DAILY. 90 tablet 3  . S-Adenosylmethionine (SAME PO) Take by mouth.     No current facility-administered medications on file prior to visit.     Review of Systems:  As per HPI- otherwise negative.   Physical Examination: Vitals:   04/20/17 0906  BP: 124/88  Pulse: 78  Temp: 98.5 F (36.9 C)   Vitals:   04/20/17 0906  Weight: 241 lb (109.3 kg)  Height: 5' 6"  (1.676 m)   Body mass index is 38.9 kg/m. Ideal Body Weight: Weight in (lb) to have BMI = 25: 154.6  GEN: WDWN, NAD, Non-toxic,  A & O x 3, obese, looks well HEENT: Atraumatic, Normocephalic. Neck supple. No masses, No LAD.  Bilateral TM wnl, oropharynx normal.  PEERL,EOMI.   Ears and Nose: No external deformity. CV: RRR, No M/G/R. No JVD.  No thrill. No extra heart sounds. PULM: CTA B, no wheezes, crackles, rhonchi. No retractions. No resp. distress. No accessory muscle use. EXTR: No c/c/e NEURO Normal gait.  PSYCH: Normally interactive. Conversant. Not depressed or anxious appearing.  Calm demeanor.  Normal foot exam today  Results for orders placed or performed in visit on 04/20/17  POCT urinalysis dipstick  Result Value Ref Range   Color, UA yellow yellow   Clarity, UA clear clear   Glucose, UA =500 (A) negative mg/dL   Bilirubin, UA negative negative   Ketones, POC UA negative negative mg/dL   Spec Grav, UA 1.020 1.010 - 1.025   Blood, UA negative negative   pH, UA 6.0 5.0 - 8.0   Protein Ur, POC negative negative mg/dL   Urobilinogen, UA 0.2 0.2 or 1.0 E.U./dL   Nitrite, UA Negative Negative   Leukocytes, UA Negative Negative    Assessment and Plan: Type 2 diabetes mellitus without complication, without long-term current use of insulin (HCC) - Plan: Comprehensive metabolic panel, Hemoglobin A1c, Microalbumin / creatinine urine ratio  Gastroesophageal reflux disease, esophagitis presence not specified  Benign essential HTN  Mixed hyperlipidemia  Transaminitis - Plan: Comprehensive metabolic panel  Urinary frequency - Plan: Urine Culture, POCT urinalysis dipstick, nitrofurantoin, macrocrystal-monohydrate, (MACROBID) 100 MG capsule  UA is negative today (positive for glucose but she is on farxiga so this is expected) Gave her an rx for macrobid to hold while we await her urine culture Otherwise will be in touch with her results asap BP looks fine, await her labs   Signed Lamar Blinks, MD 7/20 Received her labs and gave her a call- no answer.  LMOM- potassium is low, I will send in K for  her to take for 5 days.  Will try her back late- if she could set up mychart it might be helpful   Called back and spoke to her on 7/20- her K is a bit low, she will take the kdur 20 meq daily for 5 days She is having a minor ear operation on Monday- they may check her K then, if they do not I will order for her to have done as a lab visit only in about 1 week Also, she does have proteinuria.  Will change her to hctz with lisinopril for reno protection Advised that her LFTs are still up- she will get her liver US Also she will work on her diet and exercise- recheck in 3 months to look at her A1c, she already has an appt   Results for orders placed or performed in visit on 04/20/17  Urine Culture  Result Value Ref Range   Organism ID, Bacteria NO GROWTH   Comprehensive metabolic panel  Result Value Ref Range   Sodium 138 135 - 145 mEq/L   Potassium 3.1 (L) 3.5 - 5.1 mEq/L   Chloride 100 96 - 112 mEq/L   CO2 28 19 - 32 mEq/L   Glucose, Bld 179 (H) 70 - 99 mg/dL   BUN 11 6 - 23 mg/dL   Creatinine, Ser 0.81 0.40 - 1.20 mg/dL   Total Bilirubin 0.4 0.2 - 1.2 mg/dL   Alkaline Phosphatase 110 39 - 117 U/L   AST 87 (H) 0 - 37 U/L   ALT 69 (H) 0 - 35 U/L   Total Protein 6.9 6.0 - 8.3 g/dL   Albumin 3.9 3.5 - 5.2 g/dL   Calcium 9.3 8.4 - 10.5 mg/dL   GFR 78.68 >60.00 mL/min  Hemoglobin A1c  Result Value Ref Range   Hgb A1c MFr Bld 8.3 (H) 4.6 - 6.5 %  Microalbumin / creatinine urine ratio  Result Value Ref Range   Microalb, Ur 2.3 (H) 0.0 - 1.9 mg/dL   Creatinine,U 111.7 mg/dL   Microalb Creat Ratio 2.1 0.0 - 30.0 mg/g  POCT urinalysis dipstick  Result Value Ref Range   Color, UA yellow yellow   Clarity, UA clear clear   Glucose, UA =500 (A) negative mg/dL   Bilirubin, UA negative negative   Ketones, POC UA negative negative mg/dL   Spec Grav, UA 1.020 1.010 - 1.025   Blood, UA negative negative   pH, UA 6.0 5.0 - 8.0   Protein Ur, POC negative negative mg/dL   Urobilinogen, UA  0.2 0.2 or 1.0 E.U./dL   Nitrite, UA Negative Negative   Leukocytes, UA Negative Negative

## 2017-04-20 ENCOUNTER — Ambulatory Visit (INDEPENDENT_AMBULATORY_CARE_PROVIDER_SITE_OTHER): Payer: BLUE CROSS/BLUE SHIELD | Admitting: Family Medicine

## 2017-04-20 VITALS — BP 124/88 | HR 78 | Temp 98.5°F | Ht 66.0 in | Wt 241.0 lb

## 2017-04-20 DIAGNOSIS — R809 Proteinuria, unspecified: Secondary | ICD-10-CM | POA: Diagnosis not present

## 2017-04-20 DIAGNOSIS — E782 Mixed hyperlipidemia: Secondary | ICD-10-CM

## 2017-04-20 DIAGNOSIS — I10 Essential (primary) hypertension: Secondary | ICD-10-CM

## 2017-04-20 DIAGNOSIS — R740 Nonspecific elevation of levels of transaminase and lactic acid dehydrogenase [LDH]: Secondary | ICD-10-CM | POA: Diagnosis not present

## 2017-04-20 DIAGNOSIS — K219 Gastro-esophageal reflux disease without esophagitis: Secondary | ICD-10-CM | POA: Diagnosis not present

## 2017-04-20 DIAGNOSIS — R7401 Elevation of levels of liver transaminase levels: Secondary | ICD-10-CM

## 2017-04-20 DIAGNOSIS — E119 Type 2 diabetes mellitus without complications: Secondary | ICD-10-CM | POA: Diagnosis not present

## 2017-04-20 DIAGNOSIS — R350 Frequency of micturition: Secondary | ICD-10-CM

## 2017-04-20 DIAGNOSIS — E876 Hypokalemia: Secondary | ICD-10-CM

## 2017-04-20 LAB — POCT URINALYSIS DIP (MANUAL ENTRY)
BILIRUBIN UA: NEGATIVE
BILIRUBIN UA: NEGATIVE mg/dL
Leukocytes, UA: NEGATIVE
Nitrite, UA: NEGATIVE
Protein Ur, POC: NEGATIVE mg/dL
RBC UA: NEGATIVE
SPEC GRAV UA: 1.02 (ref 1.010–1.025)
Urobilinogen, UA: 0.2 E.U./dL
pH, UA: 6 (ref 5.0–8.0)

## 2017-04-20 LAB — COMPREHENSIVE METABOLIC PANEL
ALBUMIN: 3.9 g/dL (ref 3.5–5.2)
ALK PHOS: 110 U/L (ref 39–117)
ALT: 69 U/L — AB (ref 0–35)
AST: 87 U/L — AB (ref 0–37)
BILIRUBIN TOTAL: 0.4 mg/dL (ref 0.2–1.2)
BUN: 11 mg/dL (ref 6–23)
CALCIUM: 9.3 mg/dL (ref 8.4–10.5)
CO2: 28 mEq/L (ref 19–32)
Chloride: 100 mEq/L (ref 96–112)
Creatinine, Ser: 0.81 mg/dL (ref 0.40–1.20)
GFR: 78.68 mL/min (ref >60.00)
Glucose, Bld: 179 mg/dL — ABNORMAL HIGH (ref 70–99)
Potassium: 3.1 mEq/L — ABNORMAL LOW (ref 3.5–5.1)
Sodium: 138 mEq/L (ref 135–145)
Total Protein: 6.9 g/dL (ref 6.0–8.3)

## 2017-04-20 LAB — MICROALBUMIN / CREATININE URINE RATIO
Creatinine,U: 111.7 mg/dL
MICROALB UR: 2.3 mg/dL — AB (ref 0.0–1.9)
Microalb Creat Ratio: 2.1 mg/g (ref 0.0–30.0)

## 2017-04-20 LAB — HEMOGLOBIN A1C: HEMOGLOBIN A1C: 8.3 % — AB (ref 4.6–6.5)

## 2017-04-20 MED ORDER — NITROFURANTOIN MONOHYD MACRO 100 MG PO CAPS: 100.0000 mg | ORAL_CAPSULE | Freq: Two times a day (BID) | ORAL | 0 refills | Status: DC

## 2017-04-20 NOTE — Patient Instructions (Addendum)
It was nice to see you today- I will be in touch with your labs asap Your BP looks fine today I hope that your A1c will also look better I will be in touch with your urine culture asap- so far I do not see evidence of a UTI but we will treat you with abx if needed. I printed out an rx for macrobid for you to hold- if your culture shows infection we will have you fill this rx   Let's plan to visit again in about 3 months Take care!

## 2017-04-21 ENCOUNTER — Telehealth: Payer: Self-pay | Admitting: Family Medicine

## 2017-04-21 LAB — URINE CULTURE: ORGANISM ID, BACTERIA: NO GROWTH

## 2017-04-21 MED ORDER — POTASSIUM CHLORIDE CRYS ER 20 MEQ PO TBCR: 20.0000 meq | EXTENDED_RELEASE_TABLET | Freq: Every day | ORAL | 0 refills | Status: DC

## 2017-04-21 MED ORDER — LISINOPRIL-HYDROCHLOROTHIAZIDE 10-12.5 MG PO TABS: ORAL_TABLET | ORAL | 3 refills | Status: DC

## 2017-04-21 NOTE — Addendum Note (Signed)
Addended by: Lamar Blinks C on: 04/21/2017 03:57 PM   Modules accepted: Orders

## 2017-04-21 NOTE — Addendum Note (Signed)
Addended by: Lamar Blinks C on: 04/21/2017 05:00 PM   Modules accepted: Orders

## 2017-04-21 NOTE — Telephone Encounter (Signed)
Pt returned call for lab results.    Pt would like to be contacted at [phone]

## 2017-05-01 NOTE — Telephone Encounter (Signed)
Lab results from 04/20/17 not resulted.  Please advise.

## 2017-05-01 NOTE — Telephone Encounter (Signed)
Called patient.  Pt stated she did take potassium.  Has not started lisinopril HCTZ yet.  Will call back to schedule lab appt.  Future BMP ordered.

## 2017-05-01 NOTE — Telephone Encounter (Signed)
Advise patient: Apparently they  did talk to her, see below. Did she take potassium for 5 days? Is she taking lisinopril HCT?Marland Kitchen To be sure I would recommend a BMP this week. ====================== 7/20 Received her labs and gave her a call- no answer.  LMOM- potassium is low, I will send in K for her to take for 5 days.  Will try her back late- if she could set up mychart it might be helpful    Called back and spoke to her on 7/20- her K is a bit low, she will take the kdur 20 meq daily for 5 days She is having a minor ear operation on Monday- they may check her K then, if they do not I will order for her to have done as a lab visit only in about 1 week Also, she does have proteinuria.  Will change her to hctz with lisinopril for reno protection Advised that her LFTs are still up- she will get her liver US Also she will work on her diet and exercise- recheck in 3 months to look at her A1c, she already has an appt

## 2017-05-02 NOTE — Telephone Encounter (Signed)
Pt returning call wanting to speak with Ashlee about last conversation. Please advise. Pt tel  [phone].

## 2017-05-03 NOTE — Telephone Encounter (Addendum)
Appt scheduled.  She still has not started lisinopril hctz.  Stated she has some questions regarding medication and plans to discuss with pharmacist.

## 2017-05-05 ENCOUNTER — Other Ambulatory Visit: Payer: Self-pay

## 2017-05-08 ENCOUNTER — Other Ambulatory Visit (INDEPENDENT_AMBULATORY_CARE_PROVIDER_SITE_OTHER): Payer: BLUE CROSS/BLUE SHIELD

## 2017-05-08 DIAGNOSIS — E876 Hypokalemia: Secondary | ICD-10-CM | POA: Diagnosis not present

## 2017-05-08 LAB — BASIC METABOLIC PANEL
BUN: 11 mg/dL (ref 6–23)
CALCIUM: 9.4 mg/dL (ref 8.4–10.5)
CHLORIDE: 98 meq/L (ref 96–112)
CO2: 24 meq/L (ref 19–32)
CREATININE: 0.96 mg/dL (ref 0.40–1.20)
GFR: 64.66 mL/min (ref >60.00)
GLUCOSE: 305 mg/dL — AB (ref 70–99)
Potassium: 3.5 mEq/L (ref 3.5–5.1)
Sodium: 135 mEq/L (ref 135–145)

## 2017-05-10 ENCOUNTER — Telehealth: Payer: Self-pay | Admitting: Family Medicine

## 2017-05-10 NOTE — Telephone Encounter (Signed)
Caller name: Relation to WP:YKDX Call back Irwin:  Reason for call: pt would like to know if she can get a copy of her results sent to Dr. Young Berry attention [facility], states office number is [phone], pt also would like a copy for herself as well. Pt is aware that she may have to sign a release form if Dr. Lorelei Pont will not fax. Please call pt to make her aware

## 2017-05-11 NOTE — Telephone Encounter (Signed)
Left a message on pt's vm to give the office a call back. Call was inference to pt requesting for copy of records going to Dr. Young Berry. Provider is out of the office pt have to sign a release form. LB

## 2017-05-16 ENCOUNTER — Ambulatory Visit (INDEPENDENT_AMBULATORY_CARE_PROVIDER_SITE_OTHER): Payer: BLUE CROSS/BLUE SHIELD | Admitting: Psychiatry

## 2017-05-16 DIAGNOSIS — Z818 Family history of other mental and behavioral disorders: Secondary | ICD-10-CM

## 2017-05-16 DIAGNOSIS — Z810 Family history of intellectual disabilities: Secondary | ICD-10-CM

## 2017-05-16 DIAGNOSIS — F410 Panic disorder [episodic paroxysmal anxiety] without agoraphobia: Secondary | ICD-10-CM | POA: Diagnosis not present

## 2017-05-16 DIAGNOSIS — F1010 Alcohol abuse, uncomplicated: Secondary | ICD-10-CM

## 2017-05-16 DIAGNOSIS — F3341 Major depressive disorder, recurrent, in partial remission: Secondary | ICD-10-CM | POA: Diagnosis not present

## 2017-05-16 MED ORDER — CITALOPRAM HYDROBROMIDE 40 MG PO TABS: 40.0000 mg | ORAL_TABLET | Freq: Every day | ORAL | 1 refills | Status: DC
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
## 2017-05-17 ENCOUNTER — Ambulatory Visit (HOSPITAL_BASED_OUTPATIENT_CLINIC_OR_DEPARTMENT_OTHER)
Admission: RE | Admit: 2017-05-17 | Discharge: 2017-05-17 | Disposition: A | Discharge status: Home / Self Care | Payer: BLUE CROSS/BLUE SHIELD | Source: Ambulatory Visit | Attending: Family Medicine | Admitting: Family Medicine

## 2017-05-17 DIAGNOSIS — K760 Fatty (change of) liver, not elsewhere classified: Secondary | ICD-10-CM | POA: Insufficient documentation

## 2017-05-17 DIAGNOSIS — R7401 Elevation of levels of liver transaminase levels: Secondary | ICD-10-CM

## 2017-05-17 DIAGNOSIS — R938 Abnormal findings on diagnostic imaging of other specified body structures: Secondary | ICD-10-CM | POA: Insufficient documentation

## 2017-05-17 DIAGNOSIS — R740 Nonspecific elevation of levels of transaminase and lactic acid dehydrogenase [LDH]: Secondary | ICD-10-CM | POA: Insufficient documentation

## 2017-05-22 ENCOUNTER — Telehealth: Payer: Self-pay | Admitting: *Deleted

## 2017-05-22 NOTE — Telephone Encounter (Signed)
Received results from [facility]; forwarded to provider/SLS 08/20

## 2017-06-01 ENCOUNTER — Other Ambulatory Visit: Payer: Self-pay | Admitting: Family Medicine

## 2017-06-01 DIAGNOSIS — E876 Hypokalemia: Secondary | ICD-10-CM

## 2017-06-01 NOTE — Telephone Encounter (Signed)
Faxed 30d/thx dmf

## 2017-06-13 ENCOUNTER — Telehealth: Payer: Self-pay | Admitting: Family Medicine

## 2017-06-13 DIAGNOSIS — K824 Cholesterolosis of gallbladder: Secondary | ICD-10-CM

## 2017-06-13 NOTE — Telephone Encounter (Signed)
Called to go over her hepatic US- would like to repeat US in about 6 months to eval polyps- she is ok with this.  She had to stop farxiga due to SE.  She does not have a meter currently and cannot check her sugar.  She will come in tomorrow and pick up a meter and rx for strips which I will get together for her

## 2017-06-14 MED ORDER — GLUCOSE BLOOD VI STRP: ORAL_STRIP | 12 refills | Status: DC

## 2017-06-19 ENCOUNTER — Telehealth: Payer: Self-pay | Admitting: Family Medicine

## 2017-06-19 NOTE — Telephone Encounter (Signed)
Pt wants to bring Accucheck meter back here and swap it out for OneTouch meter if we have it. Pt states ins will not pay for Accucheck strips but will pay for OneTouch. Call pt.

## 2017-06-20 NOTE — Telephone Encounter (Signed)
Called pt and LMOM- I will ask my nurse to see if we have a one touch and give her a call.  She is certainly welcome to have one. No need to return the other meter Norvel Richards- can you check and see if we have a one touch glucometer in the cabinet?  If so please give her a call and she can pick it up Thank you!

## 2017-06-20 NOTE — Telephone Encounter (Signed)
°  Relation to ME:BRAX Call back number:[phone]   Reason for call:  Patient checking on the status of message below, informed patient PCP is out of the office and will follow up when she returns

## 2017-06-21 ENCOUNTER — Other Ambulatory Visit: Payer: Self-pay | Admitting: Emergency Medicine

## 2017-06-21 NOTE — Telephone Encounter (Signed)
Called pt to inform her that I will put a OneTouch meter up front for her to pick up at her convenience. Pt verbalized understanding and will pick the meter up.

## 2017-07-22 NOTE — Progress Notes (Addendum)
Natalie Richard at [facility], [name], [name] [address] [phone]  Date:  07/24/2017   Name:  Natalie Richard   DOB:  1966-08-08   MRN:  [id number]  PCP:  Natalie Mclean, MD    Chief Complaint: Follow-up (Pt here for 3 month f/u visit. )   History of Present Illness:  Natalie Richard is a 51 y.o. very pleasant female patient who presents with the following:  Here today for a follow-up visit History of obesity, HTN, hyperlipidemia, DM, NASH She tested her glucose this am and it was 145- she had not been checking her glucose that often recently, but notes that her daytime glucose may be 150- 165 She is fasting today- just took her medications so far, did not eat  He did add farxiga but she had to stop using this due to urinary sx  She is just taking actos 30 at this time for her DM.  Also not able to tolerate metformin   BP Readings from Last 3 Encounters:  07/24/17 114/84  04/20/17 124/88  02/20/17 128/78   She is taking 1/2 tablet of lisinopril/ hctz and her BP is well controlled.  She was mistakenly taking a whole tablet for a while but noted that she felt lightheaded - then realized she was supposed to be on 1/2 and now feels much better!   Lab Results  Component Value Date   HGBA1C 8.3 (H) 04/20/2017  eye exam due- appt next month Flu shot due- she plans to do at her husband's job, declines to do today Pneumonia vaccine: due today  From our last visit in July:  History of obesity, HTN, DM, NASH Here today for a follow-up visit Last seen here in April to follow-up on her DM. At her last visit she was having a hard time as her mother passed away in 26-Nov-2022 of this year, and her cat also died.   We added farxiga as her A1c was high- she seems to be tolerating this well and has not noted any SE She is also on actos She has not been able to tolerate metformin She did not end up getting her liver  ultrasound as she was too busy. We will check her LFTs again today Tomorrow she is having a procedure on her ear to try and cure her ETD Due for a urine micro today She never had a pneumonia vaccine- she prefers to do at her next visit  She just got a new kitten- she is really enjoying having a cat again. Her sister found stray kittens so they are raising them   Noted to have transaminitis- we did a liver US for her in August, as below IMPRESSION: Small subcentimeter gallbladder polyps versus nonshadowing adherent stones. No signs of cholecystitis or biliary dilatation Hepatic steatosis  No GERD or digestive symptoms recently Her mood is "up and down," she has a harder time around the holidays, and a good friend is ill with cancer which is end- stage. She is trying to support her and be there for her She is really enjoying her kitten however- feels like the cat came into her life just when she needed her She does see a mental health provider-  She is taking klonopin 0.25 BID right now- this is per Dr. Daron Offer, who practices at [facility] health  Patient Active Problem List   Diagnosis Date Noted  . NASH (nonalcoholic steatohepatitis) 07/26/2015  .  BMI 39.0-39.9,adult 03/19/2012  . Hematoma 03/19/2012  . HTN (hypertension), benign 01/09/2012  . Osteopenia 01/09/2012  . Migraines 01/09/2012  . Insomnia 01/09/2012  . Anxiety and depression 01/09/2012  . Hyperlipidemia 01/09/2012  . Diabetes mellitus 01/09/2012  . Allergic rhinitis 01/09/2012    Past Medical History:  Diagnosis Date  . Anxiety   . 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 Depression   . Diabetes mellitus   . Diabetes mellitus, type II ([place])   . GERD (gastroesophageal reflux disease)   . Hematoma    on buttock  . Hyperlipidemia   . Hypertension   . Migraine   . Seasonal allergies     Past Surgical History:  Procedure Laterality Date  . ABDOMINAL HYSTERECTOMY  2004   PCOS  . EYE SURGERY     . INNER EAR SURGERY Left 08/2016    Social History  Substance Use Topics  . Smoking status: Never Smoker  . Smokeless tobacco: Never Used  . Alcohol use 4.8 - 6.0 oz/week    6 Cans of beer, 2 Shots of liquor per week     Comment: weekly    Family History  Problem Relation Age of Onset  . Cancer Mother 85       colon  . Diabetes Mother   . Rheum arthritis Mother   . Dementia Mother   . Depression Mother   . Cancer Father 83       bone and prostate  . Cancer Maternal Grandmother        breast  . Diabetes Maternal Grandfather   . Depression Cousin     Allergies  Allergen Reactions  . Buspar [Buspirone] Other (See Comments)    Lip swelling and rash numbness   . Augmentin [Amoxicillin-Pot Clavulanate] Nausea And Vomiting  . Biaxin [Clarithromycin]   . Ciprofloxacin     Developed itching with IV form in the hospital, but has since taken PO without a problem  . Doxycycline Itching  . Levaquin [Levofloxacin] Hives  . Metformin And Related Nausea And Vomiting  . Pyridium [Phenazopyridine Hcl]     Does not remember reaction  . Sulfa Antibiotics     As a younger person had itching, but has taken more recently and did ok  . Latex Rash  . Lorabid [Loracarbef] Rash    Medication list has been reviewed and updated.  Current Outpatient Prescriptions on File Prior to Visit  Medication Sig Dispense Refill  . azelastine (ASTELIN) 0.1 % nasal spray Place 2 sprays into both nostrils 2 (two) times daily. (Patient taking differently: Place 2 sprays into both nostrils as needed. ) 30 mL 6  . citalopram (CELEXA) 40 MG tablet Take 1 tablet (40 mg total) by mouth daily. 90 tablet 1  . clonazePAM (KLONOPIN) 0.5 MG tablet Take 1 tablet (0.5 mg total) by mouth 2 (two) times daily. (Patient taking differently: Take 0.25 mg by mouth 2 (two) times daily. ) 180 tablet 3  . dexlansoprazole (DEXILANT) 60 MG capsule Take 1 capsule (60 mg total) by mouth daily. 90 capsule 3  . fluticasone  (FLONASE) 50 MCG/ACT nasal spray as needed.    . folic acid (FOLVITE) 1 MG tablet TAKE 1 TABLET (1 MG TOTAL) BY MOUTH DAILY 90 tablet 3  . glucose blood test strip Use to check glucose 1-2x daily 100 each 12  . lisinopril-hydrochlorothiazide (PRINZIDE,ZESTORETIC) 10-12.5 MG tablet Take 1/2 tablet once a day.  May increase to whole tablet if  directed by MD 90 tablet 3  . loratadine (CLARITIN) 10 MG tablet Take 1 tablet (10 mg total) by mouth daily. 90 tablet 3  . Multiple Vitamins-Minerals (MULTIVITAMIN WITH MINERALS) tablet Take 1 tablet by mouth daily.    . Omega-3 Fatty Acids (FISH OIL) 1000 MG CAPS Take by mouth.    . pioglitazone (ACTOS) 30 MG tablet Take 1 tablet (30 mg total) by mouth daily. 90 tablet 3  . rosuvastatin (CRESTOR) 40 MG tablet TAKE 1 TABLET (40 MG TOTAL) BY MOUTH DAILY. 90 tablet 3  . S-Adenosylmethionine (SAME PO) Take by mouth.     No current facility-administered medications on file prior to visit.     Review of Systems:  As per HPI- otherwise negative. No fever or chills Urinary sx are resolved since off farxiga History of hysterectomy    Physical Examination: Vitals:   07/24/17 0847  BP: 114/84  Pulse: 81  Temp: 98.3 F (36.8 C)  SpO2: 98%   Vitals:   07/24/17 0847  Weight: 248 lb (112.5 kg)  Height: 5' 6"  (1.676 m)   Body mass index is 40.03 kg/m. Ideal Body Weight: Weight in (lb) to have BMI = 25: 154.6  GEN: WDWN, NAD, Non-toxic, A & O x 3, obese, otherwise looks well HEENT: Atraumatic, Normocephalic. Neck supple. No masses, No LAD. Ears and Nose: No external deformity. CV: RRR, No M/G/R. No JVD. No thrill. No extra heart sounds. PULM: CTA B, no wheezes, crackles, rhonchi. No retractions. No resp. distress. No accessory muscle use. EXTR: No c/c/e NEURO Normal gait.  PSYCH: Normally interactive. Conversant. Not depressed or anxious appearing.  Calm demeanor.    Assessment and Plan: Type 2 diabetes mellitus without complication, without  long-term current use of insulin (HCC) - Plan: Hemoglobin A1c, Pneumococcal polysaccharide vaccine 23-valent greater than or equal to 2yo subcutaneous/IM  Benign essential HTN  Mixed hyperlipidemia - Plan: Lipid panel  Transaminitis - Plan: Comprehensive metabolic panel  Recheck DM today- will adjust regimen if needed BP is well controlled on current regimen check lytes and renal function today Check lipids today Given pneumovax today- encouraged flu shot Follow-up on her LFTs today Will plan further follow- up pending labs.   Signed Lamar Blinks, MD  Received her labs 10/25, gave her a call- A1c has gone from 8.3 to 8.2. She is just on actos- could not tolerate metformin or SGLT2 Would like to try a GLP-1- it looks like trulicity is preferred by her plan. She thinks that she can do this- if not comfortable doing shot she can see [place] for her first dose  Cholesterol looks fine LFTs are high today, about at her baseline- noted to have fatty liver on [place] 8/18  IMPRESSION: Small subcentimeter gallbladder polyps versus nonshadowing adherent stones. No signs of cholecystitis or biliary dilatation Hepatic steatosis  Results for orders placed or performed in visit on 07/24/17  Hemoglobin A1c  Result Value Ref Range   Hgb A1c MFr Bld 8.2 (H) 4.6 - 6.5 %  Lipid panel  Result Value Ref Range   Cholesterol 201 (H) 0 - 200 mg/dL   Triglycerides 143.0 0.0 - 149.0 mg/dL   HDL 63.50 >39.00 mg/dL   VLDL 28.6 0.0 - 40.0 mg/dL   LDL Cholesterol 109 (H) 0 - 99 mg/dL   Total CHOL/HDL Ratio 3    NonHDL 137.81   Comprehensive metabolic panel  Result Value Ref Range   Sodium 139 135 - 145 mEq/L   Potassium 3.7 3.5 -  5.1 mEq/L   Chloride 102 96 - 112 mEq/L   CO2 28 19 - 32 mEq/L   Glucose, Bld 159 (H) 70 - 99 mg/dL   BUN 11 6 - 23 mg/dL   Creatinine, Ser 0.78 0.40 - 1.20 mg/dL   Total Bilirubin 0.3 0.2 - 1.2 mg/dL   Alkaline Phosphatase 102 39 - 117 U/L   AST 105 (H) 0 - 37 U/L   ALT  82 (H) 0 - 35 U/L   Total Protein 7.0 6.0 - 8.3 g/dL   Albumin 3.9 3.5 - 5.2 g/dL   Calcium 9.1 8.4 - 10.5 mg/dL   GFR 82.10 >60.00 mL/min

## 2017-07-24 ENCOUNTER — Ambulatory Visit (INDEPENDENT_AMBULATORY_CARE_PROVIDER_SITE_OTHER): Payer: BLUE CROSS/BLUE SHIELD | Admitting: Family Medicine

## 2017-07-24 VITALS — BP 114/84 | HR 81 | Temp 98.3°F | Ht 66.0 in | Wt 248.0 lb

## 2017-07-24 DIAGNOSIS — E782 Mixed hyperlipidemia: Secondary | ICD-10-CM

## 2017-07-24 DIAGNOSIS — E119 Type 2 diabetes mellitus without complications: Secondary | ICD-10-CM | POA: Diagnosis not present

## 2017-07-24 DIAGNOSIS — Z23 Encounter for immunization: Secondary | ICD-10-CM

## 2017-07-24 DIAGNOSIS — R740 Nonspecific elevation of levels of transaminase and lactic acid dehydrogenase [LDH]: Secondary | ICD-10-CM

## 2017-07-24 DIAGNOSIS — R7401 Elevation of levels of liver transaminase levels: Secondary | ICD-10-CM

## 2017-07-24 DIAGNOSIS — I10 Essential (primary) hypertension: Secondary | ICD-10-CM | POA: Diagnosis not present

## 2017-07-24 LAB — LIPID PANEL
CHOL/HDL RATIO: 3
CHOLESTEROL: 201 mg/dL — AB (ref 0–200)
HDL: 63.5 mg/dL (ref >39.00)
LDL CALC: 109 mg/dL — AB (ref 0–99)
NonHDL: 137.81
TRIGLYCERIDES: 143 mg/dL (ref 0.0–149.0)
VLDL: 28.6 mg/dL (ref 0.0–40.0)

## 2017-07-24 LAB — COMPREHENSIVE METABOLIC PANEL
ALT: 82 U/L — AB (ref 0–35)
AST: 105 U/L — AB (ref 0–37)
Albumin: 3.9 g/dL (ref 3.5–5.2)
Alkaline Phosphatase: 102 U/L (ref 39–117)
BILIRUBIN TOTAL: 0.3 mg/dL (ref 0.2–1.2)
BUN: 11 mg/dL (ref 6–23)
CHLORIDE: 102 meq/L (ref 96–112)
CO2: 28 meq/L (ref 19–32)
CREATININE: 0.78 mg/dL (ref 0.40–1.20)
Calcium: 9.1 mg/dL (ref 8.4–10.5)
GFR: 82.1 mL/min (ref >60.00)
Glucose, Bld: 159 mg/dL — ABNORMAL HIGH (ref 70–99)
Potassium: 3.7 mEq/L (ref 3.5–5.1)
SODIUM: 139 meq/L (ref 135–145)
Total Protein: 7 g/dL (ref 6.0–8.3)

## 2017-07-24 LAB — HEMOGLOBIN A1C: HEMOGLOBIN A1C: 8.2 % — AB (ref 4.6–6.5)

## 2017-07-24 NOTE — Patient Instructions (Signed)
It was great to see you again today- your BP looks fine I will be in touch with your labs asap Please ask your eye doctor to send me a copy of your upcoming exam report You got your pneumonia vaccine today Please do be sure to get your flu shot this fall!   Assuming all looks good we can plan to visit again in about 4 months

## 2017-07-27 MED ORDER — DULAGLUTIDE 0.75 MG/0.5ML ~~LOC~~ SOAJ: SUBCUTANEOUS | 11 refills | Status: DC

## 2017-07-27 NOTE — Addendum Note (Signed)
Addended by: Lamar Blinks C on: 07/27/2017 01:02 PM   Modules accepted: Orders

## 2017-07-31 ENCOUNTER — Ambulatory Visit: Payer: Self-pay | Admitting: Family Medicine

## 2017-08-16 ENCOUNTER — Ambulatory Visit (HOSPITAL_COMMUNITY): Payer: BLUE CROSS/BLUE SHIELD | Admitting: Psychiatry

## 2017-08-16 ENCOUNTER — Encounter (HOSPITAL_COMMUNITY): Payer: Self-pay | Admitting: Psychiatry

## 2017-08-16 DIAGNOSIS — Z818 Family history of other mental and behavioral disorders: Secondary | ICD-10-CM

## 2017-08-16 DIAGNOSIS — F3341 Major depressive disorder, recurrent, in partial remission: Secondary | ICD-10-CM | POA: Diagnosis not present

## 2017-08-16 DIAGNOSIS — F410 Panic disorder [episodic paroxysmal anxiety] without agoraphobia: Secondary | ICD-10-CM

## 2017-08-16 DIAGNOSIS — Z810 Family history of intellectual disabilities: Secondary | ICD-10-CM | POA: Diagnosis not present

## 2017-08-16 NOTE — Patient Instructions (Signed)
Look into a UV "happy Light" on [employer] - they are about $40 and can helpful to use 20-30 minutes in the morning
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
## 2017-10-18 ENCOUNTER — Telehealth: Payer: Self-pay | Admitting: Family Medicine

## 2017-10-18 NOTE — Telephone Encounter (Signed)
Copied from [place] [phone]. Topic: General - Other >> Oct 18, 2017  1:49 PM Lolita Rieger, [place] wrote: Reason for CRM: Pt called and stated that she needs a new coupon for her trulicity for this year as she can not afford medication without it and was told to contact her doctor office for one by the pharmacy Please contact at [phone]

## 2017-10-18 NOTE — Telephone Encounter (Signed)
Pt unable to afford Trulicity.

## 2017-10-18 NOTE — Telephone Encounter (Signed)
Called her and LMOM-  I will leave a trulicity savings card at the front for her to pick up

## 2017-10-24 ENCOUNTER — Telehealth: Payer: Self-pay | Admitting: Family Medicine

## 2017-10-24 NOTE — Telephone Encounter (Signed)
Patient RX Saving card was mailed on 10/24/2017, due to patient not picking it up and RX bin cleaned out.

## 2017-10-30 ENCOUNTER — Other Ambulatory Visit (HOSPITAL_COMMUNITY): Payer: Self-pay

## 2017-10-30 DIAGNOSIS — F410 Panic disorder [episodic paroxysmal anxiety] without agoraphobia: Secondary | ICD-10-CM

## 2017-10-30 DIAGNOSIS — F3341 Major depressive disorder, recurrent, in partial remission: Secondary | ICD-10-CM

## 2017-10-30 MED ORDER — CLONAZEPAM 0.5 MG PO TABS: 0.2500 mg | ORAL_TABLET | Freq: Two times a day (BID) | ORAL | 0 refills | Status: DC

## 2017-11-01 ENCOUNTER — Telehealth (HOSPITAL_COMMUNITY): Payer: Self-pay | Admitting: Psychiatry

## 2017-11-01 ENCOUNTER — Telehealth (HOSPITAL_COMMUNITY): Payer: Self-pay

## 2017-11-01 NOTE — Telephone Encounter (Signed)
Patient called me for a refill on her Klonopin, her last prescription was for 0.5 mg take 1/2 tablet BID, I submitted it to the pharmacy the same way, giving her a 30 day supply (she has a f/u on 2/14) Patient called and was upset that it was not written for the full 0.5 mg bid. I read your note and I told the patient that it was written that way because that is how you told the doctor you were taking it. Patient stated that she told you she needed to take a whole pill bid through the holidays. I asked her why she still needs that amount since the holidays are over. She responded "I just do." Then she wanted to know why it was not for 90 days and I explained that my protocol is to give 30 days to get patients to their appointment. I explained to patient that since her appointment is on 2/14 she should have exactly enough to get to her appointment.

## 2017-11-02 ENCOUNTER — Other Ambulatory Visit (HOSPITAL_COMMUNITY): Payer: Self-pay | Admitting: Psychiatry

## 2017-11-02 DIAGNOSIS — F410 Panic disorder [episodic paroxysmal anxiety] without agoraphobia: Secondary | ICD-10-CM

## 2017-11-02 DIAGNOSIS — F3341 Major depressive disorder, recurrent, in partial remission: Secondary | ICD-10-CM

## 2017-11-02 MED ORDER — CLONAZEPAM 0.5 MG PO TABS: 0.5000 mg | ORAL_TABLET | Freq: Two times a day (BID) | ORAL | 1 refills | Status: DC

## 2017-11-02 NOTE — Telephone Encounter (Signed)
Thank you for the heads up!

## 2017-11-02 NOTE — Telephone Encounter (Signed)
Thank you for the heads up

## 2017-11-16 ENCOUNTER — Ambulatory Visit (HOSPITAL_COMMUNITY): Payer: BLUE CROSS/BLUE SHIELD | Admitting: Psychiatry

## 2017-11-18 NOTE — Progress Notes (Addendum)
[facility] [address] [place], [place] [address] [phone]  Date:  11/20/2017   Name:  Natalie Richard   DOB:  08-10-1966   MRN:  [id number]  PCP:  Darreld Mclean, MD    Chief Complaint: No chief complaint on file.   History of Present Illness:  Natalie Richard is a 52 y.o. very pleasant female patient who presents with the following:  History of NASH, HTN, hyperlipidemia,DM We added trulicty at last visit - 0.75 mg dose   Lab Results  Component Value Date   HGBA1C 8.2 (H) 07/24/2017   GLO:VFIEPPIR today A1c due today  Will also look at her LFTs as they were higher than her baseline at last visit  From our last visit in October:  Received her labs 10/25, gave her a call- A1c has gone from 8.3 to 8.2. She is just on actos- could not tolerate metformin or SGLT2 Would like to try a GLP-1- it looks like trulicity is preferred by her plan. She thinks that she can do this- if not comfortable doing shot she can see [place] for her first dose  Cholesterol looks fine LFTs are high today, about at her baseline- noted to have fatty liver on [place] 8/18  She is using her trulicity She is not sure how this is working however, notes that her glucose is generally 160 or higher  She is using actos and also trulicity at this time  Wt Readings from Last 3 Encounters:  11/20/17 252 lb (114.3 kg)  07/24/17 248 lb (112.5 kg)  04/20/17 241 lb (109.3 kg)   unfortunately she has gained more weight- about 10 lbs over the last 6 months She is getting a colon this coming Friday per Dr. Krista Blue she has not been working on her diet or exercise recently.  She plans to start exercise following her upcoming colonoscopy   She has seen a nutritionist in the past, "I know what I am supposed to do but I'm not doing it."  She admits that she does tend to drink full sugar sodas, usually 3 cans a day.  Patient Active Problem  List   Diagnosis Date Noted  . NASH (nonalcoholic steatohepatitis) 07/26/2015  . BMI 39.0-39.9,adult 03/19/2012  . Hematoma 03/19/2012  . HTN (hypertension), benign 01/09/2012  . Osteopenia 01/09/2012  . Migraines 01/09/2012  . Insomnia 01/09/2012  . Anxiety and depression 01/09/2012  . Hyperlipidemia 01/09/2012  . Diabetes mellitus 01/09/2012  . Allergic rhinitis 01/09/2012    Past Medical History:  Diagnosis Date  . Anxiety   . 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 Depression   . Diabetes mellitus   . Diabetes mellitus, type II ([place])   . GERD (gastroesophageal reflux disease)   . Hematoma    on buttock  . Hyperlipidemia   . Hypertension   . Migraine   . Seasonal allergies     Past Surgical History:  Procedure Laterality Date  . ABDOMINAL HYSTERECTOMY  2004   PCOS  . EYE SURGERY    . INNER EAR SURGERY Left 08/2016    Social History   Tobacco Use  . Smoking status: Never Smoker  . Smokeless tobacco: Never Used  Substance Use Topics  . Alcohol use: Yes    Alcohol/week: 4.8 - 6.0 oz    Types: 6 Cans of beer, 2 Shots of liquor per week  Comment: weekly  . Drug use: No    Family History  Problem Relation Age of Onset  . Cancer Mother 51       colon  . Diabetes Mother   . Rheum arthritis Mother   . Dementia Mother   . Depression Mother   . Cancer Father 72       bone and prostate  . Cancer Maternal Grandmother        breast  . Diabetes Maternal Grandfather   . Depression Cousin     Allergies  Allergen Reactions  . Buspar [Buspirone] Other (See Comments)    Lip swelling and rash numbness   . Augmentin [Amoxicillin-Pot Clavulanate] Nausea And Vomiting  . Biaxin [Clarithromycin]   . Ciprofloxacin     Developed itching with IV form in the hospital, but has since taken PO without a problem  . Doxycycline Itching  . Levaquin [Levofloxacin] Hives  . Metformin And Related Nausea And Vomiting  . Pyridium [Phenazopyridine Hcl]     Does  not remember reaction  . Sulfa Antibiotics     As a younger person had itching, but has taken more recently and did ok  . Latex Rash  . Lorabid [Loracarbef] Rash    Medication list has been reviewed and updated.  Current Outpatient Medications on File Prior to Visit  Medication Sig Dispense Refill  . azelastine (ASTELIN) 0.1 % nasal spray Place 2 sprays into both nostrils 2 (two) times daily. (Patient taking differently: Place 2 sprays into both nostrils as needed. ) 30 mL 6  . citalopram (CELEXA) 40 MG tablet Take 1 tablet (40 mg total) by mouth daily. 90 tablet 1  . clonazePAM (KLONOPIN) 0.5 MG tablet Take 1 tablet (0.5 mg total) by mouth 2 (two) times daily. 180 tablet 1  . dexlansoprazole (DEXILANT) 60 MG capsule Take 1 capsule (60 mg total) by mouth daily. 90 capsule 3  . Dulaglutide (TRULICITY) 1.27 NT/7.0YF SOPN Inject 0.75 mg once weekly [place] 4 pen 11  . fluticasone (FLONASE) 50 MCG/ACT nasal spray as needed.    . folic acid (FOLVITE) 1 MG tablet TAKE 1 TABLET (1 MG TOTAL) BY MOUTH DAILY 90 tablet 3  . glucose blood test strip Use to check glucose 1-2x daily 100 each 12  . lisinopril-hydrochlorothiazide (PRINZIDE,ZESTORETIC) 10-12.5 MG tablet Take 1/2 tablet once a day.  May increase to whole tablet if directed by MD 90 tablet 3  . loratadine (CLARITIN) 10 MG tablet Take 1 tablet (10 mg total) by mouth daily. 90 tablet 3  . Multiple Vitamins-Minerals (MULTIVITAMIN WITH MINERALS) tablet Take 1 tablet by mouth daily.    . Omega-3 Fatty Acids (FISH OIL) 1000 MG CAPS Take by mouth.    . pioglitazone (ACTOS) 30 MG tablet Take 1 tablet (30 mg total) by mouth daily. 90 tablet 3  . rosuvastatin (CRESTOR) 40 MG tablet TAKE 1 TABLET (40 MG TOTAL) BY MOUTH DAILY. 90 tablet 3  . S-Adenosylmethionine (SAME PO) Take by mouth.     No current facility-administered medications on file prior to visit.     Review of Systems:  As per HPI- otherwise negative. No fever or chills No CP or SOB No  hypoglycemia   Physical Examination: Vitals:   11/20/17 0912  BP: 116/82  Pulse: 92  Temp: 98.1 F (36.7 C)  SpO2: 95%   Vitals:   11/20/17 0912  Weight: 252 lb (114.3 kg)  Height: 5' 6"  (1.676 m)   Body mass index is 40.67 kg/m.  Ideal Body Weight: Weight in (lb) to have BMI = 25: 154.6  GEN: WDWN, NAD, Non-toxic, A & O x 3, obese, otherwise looks well  HEENT: Atraumatic, Normocephalic. Neck supple. No masses, No LAD.  Bilateral TM wnl, oropharynx normal.  PEERL,EOMI.   Ears and Nose: No external deformity. CV: RRR, No M/G/R. No JVD. No thrill. No extra heart sounds. PULM: CTA B, no wheezes, crackles, rhonchi. No retractions. No resp. distress. No accessory muscle use. EXTR: No c/c/e NEURO Normal gait.  PSYCH: Normally interactive. Conversant. Not depressed or anxious appearing.  Calm demeanor.    Assessment and Plan: Type 2 diabetes mellitus with hyperglycemia, without long-term current use of insulin (HCC) - Plan: Hemoglobin A1c  Essential hypertension  Mixed hyperlipidemia  Transaminitis - Plan: Hepatic function panel  Obesity, Class III, BMI 40-49.9 (morbid obesity) ([place])  Here today to check on her DM She has gained weight and admits she is really not working on her diet or exercise like she should be Will plan further follow- up pending labs. May increase her trulicity to 1.5 Discussed weight management and exercise in detail with pt.  Suggested [employer] or an app such as myfitnesspal. She is trying to set her mind towards this goal and I encouraged her  BP is under control today  Will plan further follow- up pending labs.   Signed Lamar Blinks, MD  Received her labs 2/19  Results for orders placed or performed in visit on 11/20/17  Hemoglobin A1c  Result Value Ref Range   Hgb A1c MFr Bld 7.3 (H) 4.6 - 6.5 %  Hepatic function panel  Result Value Ref Range   Total Bilirubin 0.4 0.2 - 1.2 mg/dL   Bilirubin, Direct 0.1 0.0 - 0.3 mg/dL   Alkaline  Phosphatase 102 39 - 117 U/L   AST 66 (H) 0 - 37 U/L   ALT 70 (H) 0 - 35 U/L   Total Protein 7.0 6.0 - 8.3 g/dL   Albumin 3.9 3.5 - 5.2 g/dL   Her A1c is better- good news Liver function is also stable to improved over recent readings.  Letter to pt  Follow-up in 4 months

## 2017-11-20 ENCOUNTER — Ambulatory Visit: Payer: BLUE CROSS/BLUE SHIELD | Admitting: Family Medicine

## 2017-11-20 ENCOUNTER — Encounter: Payer: Self-pay | Admitting: Family Medicine

## 2017-11-20 VITALS — BP 116/82 | HR 92 | Temp 98.1°F | Ht 66.0 in | Wt 252.0 lb

## 2017-11-20 DIAGNOSIS — I10 Essential (primary) hypertension: Secondary | ICD-10-CM

## 2017-11-20 DIAGNOSIS — R7401 Elevation of levels of liver transaminase levels: Secondary | ICD-10-CM

## 2017-11-20 DIAGNOSIS — E1165 Type 2 diabetes mellitus with hyperglycemia: Secondary | ICD-10-CM

## 2017-11-20 DIAGNOSIS — R740 Nonspecific elevation of levels of transaminase and lactic acid dehydrogenase [LDH]: Secondary | ICD-10-CM | POA: Diagnosis not present

## 2017-11-20 DIAGNOSIS — E782 Mixed hyperlipidemia: Secondary | ICD-10-CM

## 2017-11-20 LAB — HEPATIC FUNCTION PANEL
ALK PHOS: 102 U/L (ref 39–117)
ALT: 70 U/L — AB (ref 0–35)
AST: 66 U/L — AB (ref 0–37)
Albumin: 3.9 g/dL (ref 3.5–5.2)
BILIRUBIN DIRECT: 0.1 mg/dL (ref 0.0–0.3)
BILIRUBIN TOTAL: 0.4 mg/dL (ref 0.2–1.2)
Total Protein: 7 g/dL (ref 6.0–8.3)

## 2017-11-20 LAB — HEMOGLOBIN A1C: Hgb A1c MFr Bld: 7.3 % — ABNORMAL HIGH (ref 4.6–6.5)

## 2017-11-20 NOTE — Patient Instructions (Signed)
It was good to see you today I will be in touch with your labs Please do make a good effort at exercise, gradual weight loss (no more than a lb a week) and on getting rid of sodas.   Let me know if you need any help in this regard!   If your A1c is not under 8% I will increase your trulicity to the higher dose

## 2017-11-22 ENCOUNTER — Telehealth: Payer: Self-pay

## 2017-11-22 NOTE — Telephone Encounter (Signed)
Pt called requesting lab results

## 2017-11-22 NOTE — Telephone Encounter (Signed)
Called her and [facility]- lab letter is on the way.  Please sign up for mychart for quicker results A1c is improved

## 2017-11-27 ENCOUNTER — Ambulatory Visit: Payer: Self-pay | Admitting: Family Medicine

## 2017-12-06 ENCOUNTER — Ambulatory Visit: Payer: Self-pay | Admitting: *Deleted

## 2017-12-06 NOTE — Telephone Encounter (Signed)
Patient phoned in with diarrhea episodes after each meal daily. She does report abdominal pain just before the stools occur then the pain is relieved. She stated her stools have been like this since her colonoscopy on 2/22. She has tried probiotics daily as instructed by her GI office. She denies any blood or mucous in the watery stools. She also reports that she sees a lot of food particles in the diarrhea. PCP is Dr. Lorelei Pont, no availability. Reason for Disposition . [1] MODERATE diarrhea (e.g., 4-6 times / day more than normal) AND [2] present > 48 hours (2 days)  Answer Assessment - Initial Assessment Questions 1. DIARRHEA SEVERITY: "How bad is the diarrhea?" "How many extra stools have you had in the past 24 hours than normal?"    - MILD: Few loose or mushy BMs; increase of 1-3 stools over normal daily number of stools; mild increase in ostomy output.   - MODERATE: Increase of 4-6 stools daily over normal; moderate increase in ostomy output.   - SEVERE (or Worst Possible): Increase of 7 or more stools daily over normal; moderate increase in ostomy output; incontinence. Mild to moderate with 3-4 stool stools, coming after each meal everyday since right after she had a colonoscopy on 2/22. 2. ONSET: "When did the diarrhea begin?"      On 2/24, a couple of days after the colonoscopy. 3. BM CONSISTENCY: "How loose or watery is the diarrhea?"      Watery with food particles. 4. VOMITING: "Are you also vomiting?" If so, ask: "How many times in the past 24 hours?"     No but has intermittent nausea. 5. ABDOMINAL PAIN: "Are you having any abdominal pain?" If yes: "What does it feel like?" (e.g., crampy, dull, intermittent, constant)      crampy and dull at the same time but is relieved as soon as she has the watery stool shortly thereafter.  6. ABDOMINAL PAIN SEVERITY: If present, ask: "How bad is the pain?"  (e.g., Scale 1-10; mild, moderate, or severe)    - MILD (1-3): doesn't interfere with normal  activities, abdomen soft and not tender to touch     - MODERATE (4-7): interferes with normal activities or awakens from sleep, tender to touch     - SEVERE (8-10): excruciating pain, doubled over, unable to do any normal activities      Moderate-takes her breath away  7. ORAL INTAKE: If vomiting, "Have you been able to drink liquids?" "How much fluids have you had in the past 24 hours?"     3-4 cups a day. 8. HYDRATION: "Any signs of dehydration?" (e.g., dry mouth [not just dry lips], too weak to stand, dizziness, new weight loss) "When did you last urinate?"    Mouth and lips very dry. Had some dizziness yesterday.Makes urine 2-3 times a day without difficultly. 9. EXPOSURE: "Have you traveled to a foreign country recently?" "Have you been exposed to anyone with diarrhea?" "Could you have eaten any food that was spoiled?"     no 10. OTHER SYMPTOMS: "Do you have any other symptoms?" (e.g., fever, blood in stool)     no 11. PREGNANCY: "Is there any chance you are pregnant?" "When was your last menstrual period?"   no  Protocols used: DIARRHEA-A-AH

## 2017-12-07 ENCOUNTER — Ambulatory Visit: Payer: Self-pay | Admitting: Internal Medicine

## 2017-12-18 ENCOUNTER — Ambulatory Visit: Payer: BLUE CROSS/BLUE SHIELD | Admitting: Internal Medicine

## 2017-12-18 ENCOUNTER — Encounter: Payer: Self-pay | Admitting: Internal Medicine

## 2017-12-18 VITALS — BP 110/74 | HR 82 | Temp 98.3°F | Resp 16 | Ht 66.0 in | Wt 248.2 lb

## 2017-12-18 DIAGNOSIS — I10 Essential (primary) hypertension: Secondary | ICD-10-CM

## 2017-12-18 DIAGNOSIS — J40 Bronchitis, not specified as acute or chronic: Secondary | ICD-10-CM | POA: Diagnosis not present

## 2017-12-18 DIAGNOSIS — J069 Acute upper respiratory infection, unspecified: Secondary | ICD-10-CM

## 2017-12-18 DIAGNOSIS — E782 Mixed hyperlipidemia: Secondary | ICD-10-CM

## 2017-12-18 DIAGNOSIS — E039 Hypothyroidism, unspecified: Secondary | ICD-10-CM

## 2017-12-18 MED ORDER — HYDROCODONE-HOMATROPINE 5-1.5 MG/5ML PO SYRP: 5.0000 mL | ORAL_SOLUTION | Freq: Every evening | ORAL | 0 refills | Status: DC | PRN
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
## 2017-12-18 NOTE — Patient Instructions (Signed)
Rest, fluids , tylenol  For cough:  Take Mucinex DM twice a day as needed until better If cough sever at night: hydrocodone , will cause drowsiness   For nasal congestion: Use OTC Nasocort or Flonase : 2 nasal sprays on each side of the nose in the morning until you feel better Use ASTELIN a prescribed spray : 2 nasal sprays on each side of the nose at night until you feel better   Call if not gradually better over the next  10 days, antibiotics   Call anytime if the symptoms are severe

## 2018-01-08 ENCOUNTER — Ambulatory Visit (HOSPITAL_COMMUNITY): Payer: BLUE CROSS/BLUE SHIELD | Admitting: Psychiatry

## 2018-01-15 ENCOUNTER — Other Ambulatory Visit (HOSPITAL_COMMUNITY): Payer: Self-pay

## 2018-01-15 ENCOUNTER — Other Ambulatory Visit: Payer: Self-pay | Admitting: Family Medicine

## 2018-01-15 ENCOUNTER — Telehealth (HOSPITAL_COMMUNITY): Payer: Self-pay

## 2018-01-15 DIAGNOSIS — K219 Gastro-esophageal reflux disease without esophagitis: Secondary | ICD-10-CM

## 2018-01-15 DIAGNOSIS — F410 Panic disorder [episodic paroxysmal anxiety] without agoraphobia: Secondary | ICD-10-CM

## 2018-01-15 DIAGNOSIS — F3341 Major depressive disorder, recurrent, in partial remission: Secondary | ICD-10-CM

## 2018-01-15 MED ORDER — CITALOPRAM HYDROBROMIDE 40 MG PO TABS: 40.0000 mg | ORAL_TABLET | Freq: Every day | ORAL | 0 refills | Status: DC

## 2018-01-15 NOTE — Telephone Encounter (Signed)
Oh that is fine, she is a 6 month check in at this point. 90 days is fine

## 2018-01-15 NOTE — Telephone Encounter (Signed)
Fax received for refill of patients Citalopram, she was last seen in November and does not follow up until June, please review and advise,thank you

## 2018-01-16 NOTE — Telephone Encounter (Signed)
Pt is calling to check on med refill. Pt is aware may take up to 3 business days. Pt is out

## 2018-02-15 ENCOUNTER — Other Ambulatory Visit: Payer: Self-pay | Admitting: Family Medicine

## 2018-02-15 DIAGNOSIS — E119 Type 2 diabetes mellitus without complications: Secondary | ICD-10-CM

## 2018-02-16 ENCOUNTER — Ambulatory Visit (HOSPITAL_BASED_OUTPATIENT_CLINIC_OR_DEPARTMENT_OTHER): Payer: BLUE CROSS/BLUE SHIELD

## 2018-02-17 ENCOUNTER — Ambulatory Visit (HOSPITAL_BASED_OUTPATIENT_CLINIC_OR_DEPARTMENT_OTHER)
Admission: RE | Admit: 2018-02-17 | Discharge: 2018-02-17 | Disposition: A | Discharge status: Home / Self Care | Payer: BLUE CROSS/BLUE SHIELD | Source: Ambulatory Visit | Attending: Family Medicine | Admitting: Family Medicine

## 2018-02-17 DIAGNOSIS — K824 Cholesterolosis of gallbladder: Secondary | ICD-10-CM | POA: Insufficient documentation

## 2018-02-19 ENCOUNTER — Encounter: Payer: Self-pay | Admitting: Family Medicine

## 2018-02-19 ENCOUNTER — Ambulatory Visit (HOSPITAL_COMMUNITY): Payer: BLUE CROSS/BLUE SHIELD | Admitting: Psychiatry

## 2018-02-19 ENCOUNTER — Encounter (HOSPITAL_COMMUNITY): Payer: Self-pay | Admitting: Psychiatry

## 2018-02-19 DIAGNOSIS — F1099 Alcohol use, unspecified with unspecified alcohol-induced disorder: Secondary | ICD-10-CM

## 2018-02-19 DIAGNOSIS — Z818 Family history of other mental and behavioral disorders: Secondary | ICD-10-CM | POA: Diagnosis not present

## 2018-02-19 DIAGNOSIS — F3341 Major depressive disorder, recurrent, in partial remission: Secondary | ICD-10-CM | POA: Diagnosis not present

## 2018-02-19 DIAGNOSIS — Z91411 Personal history of adult psychological abuse: Secondary | ICD-10-CM

## 2018-02-19 DIAGNOSIS — F410 Panic disorder [episodic paroxysmal anxiety] without agoraphobia: Secondary | ICD-10-CM | POA: Diagnosis not present

## 2018-02-19 DIAGNOSIS — Z634 Disappearance and death of family member: Secondary | ICD-10-CM | POA: Diagnosis not present

## 2018-02-19 MED ORDER — CLONAZEPAM 0.5 MG PO TABS: 0.5000 mg | ORAL_TABLET | Freq: Two times a day (BID) | ORAL | 0 refills | Status: DC

## 2018-02-19 MED ORDER — HYDROXYZINE PAMOATE 25 MG PO CAPS: 25.0000 mg | ORAL_CAPSULE | Freq: Three times a day (TID) | ORAL | 0 refills | Status: DC | PRN

## 2018-02-19 MED ORDER — CITALOPRAM HYDROBROMIDE 40 MG PO TABS: 40.0000 mg | ORAL_TABLET | Freq: Every day | ORAL | 0 refills | Status: DC
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
## 2018-02-20 ENCOUNTER — Telehealth: Payer: Self-pay | Admitting: Family Medicine

## 2018-02-20 NOTE — Telephone Encounter (Signed)
Copied from [place] [id number]. Topic: Quick Communication - Other Results >> Feb 20, 2018 11:45 AM Robina Ade, Helene Kelp D wrote: Patient called and would like her [place] results given to her, if Dr. Lorelei Pont can call her back, please.

## 2018-02-21 NOTE — Telephone Encounter (Signed)
Letter was mailed to patient with results from Dr. Lorelei Pont.

## 2018-02-28 ENCOUNTER — Ambulatory Visit: Payer: Self-pay | Admitting: *Deleted

## 2018-02-28 NOTE — Telephone Encounter (Signed)
Pt reports fatigue,generalized weakness, poor endurance approx. X 3 weeks. Pt had sinus surgery 01/26/18. Also reports cough, productive at times for thick whitish sputum. No other cold type symptoms. Fatigue interferes with ADLs.  "Able to shop and do errands but I get so tired quickly." Reports on Wednesday 5/22 had episode of nausea, diarrhea, dizziness, SOB. Resolved, no episodes since. Appt made with Dr. Larose Kells for Friday. Care advise given per protocol. Instructed to call back if symptoms worsen.   Reason for Disposition . [1] Fatigue (i.e., tires easily, decreased energy) AND [2] persists > 1 week  Answer Assessment - Initial Assessment Questions 1. DESCRIPTION: "Describe how you are feeling."     Fatigued, tires quickly 2. SEVERITY: "How bad is it?"  "Can you stand and walk?"   - MILD - Feels weak or tired, but does not interfere with work, school or normal activities   - [place] to stand and walk; weakness interferes with work, school, or normal activities   - SEVERE - Unable to stand or walk     moderate 3. ONSET:  "When did the weakness begin?"     1 month ago 4. CAUSE: "What do you think is causing the weakness?"     Unsure 5. MEDICINES: "Have you recently started a new medicine or had a change in the amount of a medicine?"     no 6. OTHER SYMPTOMS: "Do you have any other symptoms?" (e.g., chest pain, fever, cough, SOB, vomiting, diarrhea, bleeding)     Cough x 1 month or more 7. PREGNANCY: "Is there any chance you are pregnant?" "When was your last menstrual period?"     no  Protocols used: WEAKNESS (GENERALIZED) AND FATIGUE-A-AH

## 2018-03-01 ENCOUNTER — Telehealth (HOSPITAL_COMMUNITY): Payer: Self-pay

## 2018-03-01 NOTE — Telephone Encounter (Signed)
Received a fax from the pharmacy saying that the medication Hydroxyzine 20m capsules are on back order. Please advise

## 2018-03-02 ENCOUNTER — Ambulatory Visit: Payer: Self-pay | Admitting: Internal Medicine

## 2018-03-02 NOTE — Telephone Encounter (Signed)
Patient doesn't use this particularly often, so she may have some left over and be okay for now.

## 2018-03-02 NOTE — Telephone Encounter (Signed)
Sent pharmacy back denial for this medication

## 2018-03-07 ENCOUNTER — Ambulatory Visit (HOSPITAL_COMMUNITY): Payer: Self-pay | Admitting: Psychiatry

## 2018-03-14 ENCOUNTER — Ambulatory Visit: Payer: Self-pay | Admitting: *Deleted

## 2018-03-14 ENCOUNTER — Telehealth: Payer: Self-pay | Admitting: Family Medicine

## 2018-03-14 NOTE — Telephone Encounter (Signed)
Advised pec to tell patient she can go ahead and take her trulicity today. Advised her to give [place] blood sugar readings over the next few days.

## 2018-03-14 NOTE — Telephone Encounter (Signed)
Per my understanding she was a couple of days late on her trulicity dose this week. Called her and LMOM- short term glucose in the 300s should not be harmful to her.  Please watch diet- lower carbs, no sweets until glucose is back in range. Hydrate well If not getting better or any other concerns please alert me   Lab Results  Component Value Date   HGBA1C 7.3 (H) 11/20/2017

## 2018-03-14 NOTE — Telephone Encounter (Signed)
Called patient back and explained to her again that the trulicity does not work immediately. I explained to her she will need to let it work  Then give [place] a reading in a couple of days. She states her sugar is still around 330 and has never seen it this high. Advised her to give it time and if her blood sugars continue to rise this afternoon to go to the ER, patient agreed. She would like to know if she could do anything in the mean time. Please advise.

## 2018-03-14 NOTE — Telephone Encounter (Signed)
Patient called back to see if she should go ahead an take her Trulicity. Call to flow/office- per office- patient advised to go ahead and take medication. It should bring the levels down gradually. Patient advised to check glucose levels over the next couple days. She should resume her Trulicity on her normal schedule on Saturday. Patient voices understanding.

## 2018-03-14 NOTE — Telephone Encounter (Signed)
Pt reports BS this am at 1100 prior to lunch ...267. Pt states she has not checked her BS in "months." Only checked this am because "Not feeling well, sluggish."  Reports under stress lately with recent death of friend. Missed dose of Trulicity this Saturday. States is taking all other meds as ordered. Also reports mild intermittent dizziness at times "for weeks." States is not hydrating well. Home care advise given per protocol.  Instructed to call back if dizziness worsens, persists; CB for BS 300 two or more times consistently, any rapid breathing, vomiting. Reviewed importance of not missing medications, routinely checking BS.  Pt verbalizes understanding.         Reason for Disposition . [1] Blood glucose > 240 mg/dl (13 mmol/l) AND [2] uses insulin (e.g., insulin-dependent, all people with type 1 diabetes)  Answer Assessment - Initial Assessment Questions 1. BLOOD GLUCOSE: "What is your blood glucose level?"      267 before lunch today 2. ONSET: "When did you check the blood glucose?"     1100 3. USUAL RANGE: "What is your glucose level usually?" (e.g., usual fasting morning value, usual evening value)     Has not checked for "months" 4. KETONES: "Do you check for ketones (urine or blood test strips)?" If yes, ask: "What does the test show now?"      no 5. TYPE 1 or 2:  "Do you know what type of diabetes you have?"  (e.g., Type 1, Type 2, Gestational; doesn't know)      Type 2 6. INSULIN: "Do you take insulin?" If yes, ask: "Have you missed any shots recently?"     On trulicity, missed dose Saturday 7. DIABETES PILLS: "Do you take any pills for your diabetes?" If yes, ask: "Have you missed taking any pills recently?"     Trulicity 8. OTHER SYMPTOMS: "Do you have any symptoms?" (e.g., fever, frequent urination, difficulty breathing, dizziness, weakness, vomiting)     Lightheaded intermittently, weeks ago, no SOB or rapid breathing  Protocols used: DIABETES - HIGH BLOOD SUGAR-A-AH

## 2018-03-14 NOTE — Telephone Encounter (Signed)
Copied from [place] [id number]. Topic: Quick Communication - See Telephone Encounter >> Mar 14, 2018  2:29 PM Robina Ade, Helene Kelp D wrote: CRM for notification. See Telephone encounter for: 03/14/18. Patient called and said that her sugar read 355 after eating and that was with already taking her medication trulicity. Patient would like to talk to a nurse about this. Please call patient back, thanks.

## 2018-03-18 NOTE — Progress Notes (Deleted)
[facility] at [facility] [address], [place], [place] [address] [phone]  Date:  03/21/2018   Name:  Natalie Richard   DOB:  Dec 01, 1965   MRN:  [id number]  PCP:  Darreld Mclean, MD    Chief Complaint: No chief complaint on file.   History of Present Illness:  Natalie Richard is a 52 y.o. very pleasant female patient who presents with the following:  Periodic follow-up visit today History of hyperlipidemia, HTN, diabetes  I last saw her in February of this year: She has gained weight and admits she is really not working on her diet or exercise like she should be Will plan further follow- up pending labs. May increase her trulicity to 1.5 Discussed weight management and exercise in detail with pt.  Suggested [employer] or an app such as myfitnesspal. She is trying to set her mind towards this goal and I encouraged her  BP is under control today   Lab Results  Component Value Date   HGBA1C 7.3 (H) 11/20/2017    Patient Active Problem List   Diagnosis Date Noted  . NASH (nonalcoholic steatohepatitis) 07/26/2015  . BMI 39.0-39.9,adult 03/19/2012  . Hematoma 03/19/2012  . HTN (hypertension), benign 01/09/2012  . Osteopenia 01/09/2012  . Migraines 01/09/2012  . Insomnia 01/09/2012  . Anxiety and depression 01/09/2012  . Hyperlipidemia 01/09/2012  . Diabetes mellitus 01/09/2012  . Allergic rhinitis 01/09/2012    Past Medical History:  Diagnosis Date  . Anxiety   . 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 Depression   . Diabetes mellitus   . Diabetes mellitus, type II ([place])   . GERD (gastroesophageal reflux disease)   . Hematoma    on buttock  . Hyperlipidemia   . Hypertension   . Migraine   . Seasonal allergies     Past Surgical History:  Procedure Laterality Date  . ABDOMINAL HYSTERECTOMY  2004   PCOS  . EYE SURGERY    . INNER EAR SURGERY Left 08/2016    Social History   Tobacco Use   . Smoking status: Never Smoker  . Smokeless tobacco: Never Used  Substance Use Topics  . Alcohol use: Yes    Alcohol/week: 4.8 - 6.0 oz    Types: 6 Cans of beer, 2 Shots of liquor per week    Comment: weekly  . Drug use: No    Family History  Problem Relation Age of Onset  . Cancer Mother 12       colon  . Diabetes Mother   . Rheum arthritis Mother   . Dementia Mother   . Depression Mother   . Cancer Father 71       bone and prostate  . Cancer Maternal Grandmother        breast  . Diabetes Maternal Grandfather   . Depression Cousin     Allergies  Allergen Reactions  . Buspar [Buspirone] Other (See Comments)    Lip swelling and rash numbness   . Augmentin [Amoxicillin-Pot Clavulanate] Nausea And Vomiting  . Biaxin [Clarithromycin]   . Ciprofloxacin     Developed itching with IV form in the hospital, but has since taken PO without a problem  . Doxycycline Itching  . Levaquin [Levofloxacin] Hives  . Metformin And Related Nausea And Vomiting  . Pyridium [Phenazopyridine Hcl]     Does not remember reaction  . Sulfa Antibiotics  As a younger person had itching, but has taken more recently and did ok  . Latex Rash  . Lorabid [Loracarbef] Rash    Medication list has been reviewed and updated.  Current Outpatient Medications on File Prior to Visit  Medication Sig Dispense Refill  . azelastine (ASTELIN) 0.1 % nasal spray Place 2 sprays into both nostrils 2 (two) times daily. (Patient taking differently: Place 2 sprays into both nostrils as needed. ) 30 mL 6  . citalopram (CELEXA) 40 MG tablet Take 1 tablet (40 mg total) by mouth daily. 90 tablet 0  . clonazePAM (KLONOPIN) 0.5 MG tablet Take 1 tablet (0.5 mg total) by mouth 2 (two) times daily. 180 tablet 0  . dexlansoprazole (DEXILANT) 60 MG capsule Take 1 capsule (60 mg total) by mouth daily. 90 capsule 1  . Dulaglutide (TRULICITY) 2.95 AO/1.3YQ SOPN Inject 0.75 mg once weekly [place] 4 pen 11  . fluticasone (FLONASE) 50  MCG/ACT nasal spray as needed.    . folic acid (FOLVITE) 1 MG tablet TAKE 1 TABLET (1 MG TOTAL) BY MOUTH DAILY 90 tablet 3  . glucose blood test strip Use to check glucose 1-2x daily 100 each 12  . HYDROcodone-homatropine (HYCODAN) 5-1.5 MG/5ML syrup Take 5 mLs by mouth at bedtime as needed for cough. (Patient not taking: Reported on 02/19/2018) 120 mL 0  . hydrOXYzine (VISTARIL) 25 MG capsule Take 1 capsule (25 mg total) by mouth 3 (three) times daily as needed for anxiety (sleep). 90 capsule 0  . lisinopril-hydrochlorothiazide (PRINZIDE,ZESTORETIC) 10-12.5 MG tablet Take 1/2 tablet once a day.  May increase to whole tablet if directed by MD 90 tablet 3  . loratadine (CLARITIN) 10 MG tablet Take 1 tablet (10 mg total) by mouth daily. 90 tablet 3  . Multiple Vitamins-Minerals (MULTIVITAMIN WITH MINERALS) tablet Take 1 tablet by mouth daily.    . Omega-3 Fatty Acids (FISH OIL) 1000 MG CAPS Take by mouth.    . pioglitazone (ACTOS) 30 MG tablet TAKE 1 TABLET BY MOUTH EVERY DAY 90 tablet 1  . rosuvastatin (CRESTOR) 40 MG tablet TAKE 1 TABLET (40 MG TOTAL) BY MOUTH DAILY. 90 tablet 3  . S-Adenosylmethionine (SAME PO) Take by mouth.     No current facility-administered medications on file prior to visit.     Review of Systems:  As per HPI- otherwise negative.   Physical Examination: There were no vitals filed for this visit. There were no vitals filed for this visit. There is no height or weight on file to calculate BMI. Ideal Body Weight:    GEN: WDWN, NAD, Non-toxic, A & O x 3 HEENT: Atraumatic, Normocephalic. Neck supple. No masses, No LAD. Ears and Nose: No external deformity. CV: RRR, No M/G/R. No JVD. No thrill. No extra heart sounds. PULM: CTA B, no wheezes, crackles, rhonchi. No retractions. No resp. distress. No accessory muscle use. ABD: S, NT, ND, +BS. No rebound. No HSM. EXTR: No c/c/e NEURO Normal gait.  PSYCH: Normally interactive. Conversant. Not depressed or anxious  appearing.  Calm demeanor.    Assessment and Plan: ***  Signed Lamar Blinks, MD

## 2018-03-21 ENCOUNTER — Ambulatory Visit: Payer: BLUE CROSS/BLUE SHIELD | Admitting: Family Medicine

## 2018-03-22 ENCOUNTER — Ambulatory Visit: Payer: BLUE CROSS/BLUE SHIELD | Admitting: Family Medicine

## 2018-03-22 ENCOUNTER — Encounter: Payer: Self-pay | Admitting: Family Medicine

## 2018-03-22 VITALS — BP 112/78 | HR 81 | Temp 98.1°F | Ht 66.0 in | Wt 245.4 lb

## 2018-03-22 DIAGNOSIS — E119 Type 2 diabetes mellitus without complications: Secondary | ICD-10-CM | POA: Diagnosis not present

## 2018-03-22 DIAGNOSIS — R5383 Other fatigue: Secondary | ICD-10-CM | POA: Diagnosis not present

## 2018-03-22 DIAGNOSIS — R7401 Elevation of levels of liver transaminase levels: Secondary | ICD-10-CM

## 2018-03-22 DIAGNOSIS — E782 Mixed hyperlipidemia: Secondary | ICD-10-CM

## 2018-03-22 DIAGNOSIS — R740 Nonspecific elevation of levels of transaminase and lactic acid dehydrogenase [LDH]: Secondary | ICD-10-CM

## 2018-03-22 DIAGNOSIS — E1165 Type 2 diabetes mellitus with hyperglycemia: Secondary | ICD-10-CM

## 2018-03-22 DIAGNOSIS — I10 Essential (primary) hypertension: Secondary | ICD-10-CM

## 2018-03-22 DIAGNOSIS — K7581 Nonalcoholic steatohepatitis (NASH): Secondary | ICD-10-CM

## 2018-03-22 LAB — COMPREHENSIVE METABOLIC PANEL
ALK PHOS: 124 U/L — AB (ref 39–117)
ALT: 119 U/L — AB (ref 0–35)
AST: 172 U/L — ABNORMAL HIGH (ref 0–37)
Albumin: 3.8 g/dL (ref 3.5–5.2)
BILIRUBIN TOTAL: 0.4 mg/dL (ref 0.2–1.2)
BUN: 10 mg/dL (ref 6–23)
CALCIUM: 9.1 mg/dL (ref 8.4–10.5)
CO2: 26 mEq/L (ref 19–32)
Chloride: 101 mEq/L (ref 96–112)
Creatinine, Ser: 0.74 mg/dL (ref 0.40–1.20)
GFR: 87.02 mL/min (ref >60.00)
Glucose, Bld: 249 mg/dL — ABNORMAL HIGH (ref 70–99)
Potassium: 3.8 mEq/L (ref 3.5–5.1)
Sodium: 138 mEq/L (ref 135–145)
TOTAL PROTEIN: 6.6 g/dL (ref 6.0–8.3)

## 2018-03-22 LAB — GLUCOSE, POCT (MANUAL RESULT ENTRY): POC Glucose: 250 mg/dl — AB (ref 70–99)

## 2018-03-22 LAB — CBC
HCT: 39.6 % (ref 36.0–46.0)
Hemoglobin: 13.3 g/dL (ref 12.0–15.0)
MCHC: 33.6 g/dL (ref 30.0–36.0)
MCV: 84.8 fl (ref 78.0–100.0)
PLATELETS: 292 10*3/uL (ref 150.0–400.0)
RBC: 4.67 Mil/uL (ref 3.87–5.11)
RDW: 15.6 % — ABNORMAL HIGH (ref 11.5–15.5)
WBC: 6 10*3/uL (ref 4.0–10.5)

## 2018-03-22 LAB — HEMOGLOBIN A1C: Hgb A1c MFr Bld: 10.7 % — ABNORMAL HIGH (ref 4.6–6.5)

## 2018-03-22 LAB — TSH: TSH: 1.37 u[IU]/mL (ref 0.35–4.50)

## 2018-03-22 NOTE — Patient Instructions (Addendum)
Good to see you today- I will be in touch with your labs asap If your A1c is not continuing to improve/ or if getting worse again we can increase your trulicity dose to 1.5 mg   Please be sure to hydrate well during this hot weather!

## 2018-03-22 NOTE — Progress Notes (Addendum)
[facility] [address], [place], [place] [address] [phone]  Date:  03/22/2018   Name:  Natalie Richard   DOB:  1966/08/22   MRN:  [id number]  PCP:  Darreld Mclean, MD    Chief Complaint: Follow-up (Pt here for 4 month follow up visit. )   History of Present Illness:  Natalie Richard is a 52 y.o. very pleasant female patient who presents with the following:  Periodic follow-up visit today History of DM, HTN, obesity, NASH  Lab Results  Component Value Date   HGBA1C 7.3 (H) 11/20/2017   BP Readings from Last 3 Encounters:  03/22/18 112/78  12/18/17 110/74  11/20/17 116/82   From our last visit in February:  She is using her trulicity She is not sure how this is working however, notes that her glucose is generally 160 or higher She is using actos and also trulicity at this time     Wt Readings from Last 3 Encounters:  11/20/17 252 lb (114.3 kg)  07/24/17 248 lb (112.5 kg)  04/20/17 241 lb (109.3 kg)   unfortunately she has gained more weight- about 10 lbs over the last 6 months She is getting a colon this coming Friday per Dr. Krista Blue she has not been working on her diet or exercise recently.  She plans to start exercise following her upcoming colonoscopy  She has seen a nutritionist in the past, "I know what I am supposed to do but I'm not doing it."  She admits that she does tend to drink full sugar sodas, usually 3 cans a day////////////////////////////////lab notes from last OV Good news- your A1c is better!  It looks like the trulicity is helping.  If you can work on diet, exercise and weight loss it will get even better.  Your liver function is ok, better than in recent past.  Your liver tests are high due to fat in your liver- weight loss will help here too.   Please make a good effort, and see me in about 4 months.  Take care!    Needs A1c today Needs foot exam today She is  on Trulicity 4.14- may need to increase to 1.5   BP Readings from Last 3 Encounters:  03/22/18 112/78  12/18/17 110/74  11/20/17 116/82   Wt Readings from Last 3 Encounters:  03/22/18 245 lb 6.4 oz (111.3 kg)  12/18/17 248 lb 3.2 oz (112.6 kg)  11/20/17 252 lb (114.3 kg)    She is "sort of" working on losing weight by decreasing her soft drinks and snacks She did have an issue with her blood sugar last week when she was late with her trulicity shot. She had contacted [place] as she was worried about her glucose being in the mid 200s Her fasting am sugars have been in the 200s recently  - this is an increase for her  Checked her meter against ours today and did OS BP/pulse for her   She also notes that she has felt tired and sometimes dizzy for the last month or so. She cannot really be more specific about these sx, except that she will feel lightheaded when she stands up. Admits that since she has decreased sodas she may not be taking in as much fluid as in the past   Patient Active Problem List   Diagnosis Date Noted  . NASH (nonalcoholic steatohepatitis) 07/26/2015  . BMI 39.0-39.9,adult 03/19/2012  .  Hematoma 03/19/2012  . HTN (hypertension), benign 01/09/2012  . Osteopenia 01/09/2012  . Migraines 01/09/2012  . Insomnia 01/09/2012  . Anxiety and depression 01/09/2012  . Hyperlipidemia 01/09/2012  . Diabetes mellitus 01/09/2012  . Allergic rhinitis 01/09/2012    Past Medical History:  Diagnosis Date  . Anxiety   . 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 Depression   . Diabetes mellitus   . Diabetes mellitus, type II ([place])   . GERD (gastroesophageal reflux disease)   . Hematoma    on buttock  . Hyperlipidemia   . Hypertension   . Migraine   . Seasonal allergies     Past Surgical History:  Procedure Laterality Date  . ABDOMINAL HYSTERECTOMY  2004   PCOS  . EYE SURGERY    . INNER EAR SURGERY Left 08/2016    Social History   Tobacco Use  . Smoking  status: Never Smoker  . Smokeless tobacco: Never Used  Substance Use Topics  . Alcohol use: Yes    Alcohol/week: 4.8 - 6.0 oz    Types: 6 Cans of beer, 2 Shots of liquor per week    Comment: weekly  . Drug use: No    Family History  Problem Relation Age of Onset  . Cancer Mother 23       colon  . Diabetes Mother   . Rheum arthritis Mother   . Dementia Mother   . Depression Mother   . Cancer Father 56       bone and prostate  . Cancer Maternal Grandmother        breast  . Diabetes Maternal Grandfather   . Depression Cousin     Allergies  Allergen Reactions  . Buspar [Buspirone] Other (See Comments)    Lip swelling and rash numbness   . Augmentin [Amoxicillin-Pot Clavulanate] Nausea And Vomiting  . Biaxin [Clarithromycin]   . Ciprofloxacin     Developed itching with IV form in the hospital, but has since taken PO without a problem  . Doxycycline Itching  . Levaquin [Levofloxacin] Hives  . Metformin And Related Nausea And Vomiting  . Pyridium [Phenazopyridine Hcl]     Does not remember reaction  . Sulfa Antibiotics     As a younger person had itching, but has taken more recently and did ok  . Latex Rash  . Lorabid [Loracarbef] Rash    Medication list has been reviewed and updated.  Current Outpatient Medications on File Prior to Visit  Medication Sig Dispense Refill  . azelastine (ASTELIN) 0.1 % nasal spray Place 2 sprays into both nostrils 2 (two) times daily. (Patient taking differently: Place 2 sprays into both nostrils as needed. ) 30 mL 6  . citalopram (CELEXA) 40 MG tablet Take 1 tablet (40 mg total) by mouth daily. 90 tablet 0  . clonazePAM (KLONOPIN) 0.5 MG tablet Take 1 tablet (0.5 mg total) by mouth 2 (two) times daily. 180 tablet 0  . dexlansoprazole (DEXILANT) 60 MG capsule Take 1 capsule (60 mg total) by mouth daily. 90 capsule 1  . Dulaglutide (TRULICITY) 4.96 PR/9.1MB SOPN Inject 0.75 mg once weekly [place] 4 pen 11  . fluticasone (FLONASE) 50 MCG/ACT  nasal spray as needed.    . folic acid (FOLVITE) 1 MG tablet TAKE 1 TABLET (1 MG TOTAL) BY MOUTH DAILY 90 tablet 3  . glucose blood test strip Use to check glucose 1-2x daily 100 each 12  . hydrOXYzine (VISTARIL) 25 MG capsule Take  1 capsule (25 mg total) by mouth 3 (three) times daily as needed for anxiety (sleep). 90 capsule 0  . lisinopril-hydrochlorothiazide (PRINZIDE,ZESTORETIC) 10-12.5 MG tablet Take 1/2 tablet once a day.  May increase to whole tablet if directed by MD 90 tablet 3  . loratadine (CLARITIN) 10 MG tablet Take 1 tablet (10 mg total) by mouth daily. 90 tablet 3  . Multiple Vitamins-Minerals (MULTIVITAMIN WITH MINERALS) tablet Take 1 tablet by mouth daily.    . Omega-3 Fatty Acids (FISH OIL) 1000 MG CAPS Take by mouth.    . pioglitazone (ACTOS) 30 MG tablet TAKE 1 TABLET BY MOUTH EVERY DAY 90 tablet 1  . rosuvastatin (CRESTOR) 40 MG tablet TAKE 1 TABLET (40 MG TOTAL) BY MOUTH DAILY. 90 tablet 3  . S-Adenosylmethionine (SAME PO) Take 1 tablet by mouth.     Marland Kitchen HYDROcodone-homatropine (HYCODAN) 5-1.5 MG/5ML syrup Take 5 mLs by mouth at bedtime as needed for cough. (Patient not taking: Reported on 03/22/2018) 120 mL 0   No current facility-administered medications on file prior to visit.     Review of Systems:  As per HPI- otherwise negative. No fever or chills   Physical Examination: Vitals:   03/22/18 0839  BP: 112/78  Pulse: 81  Temp: 98.1 F (36.7 C)  SpO2: 94%   Vitals:   03/22/18 0839  Weight: 245 lb 6.4 oz (111.3 kg)  Height: 5' 6"  (1.676 m)   Body mass index is 39.61 kg/m. Ideal Body Weight: Weight in (lb) to have BMI = 25: 154.6  GEN: WDWN, NAD, Non-toxic, A & O x 3, obese, otherwise looks well today  HEENT: Atraumatic, Normocephalic. Neck supple. No masses, No LAD.  Bilateral TM wnl, oropharynx normal.  PEERL,EOMI.   Ears and Nose: No external deformity. CV: RRR, No M/G/R. No JVD. No thrill. No extra heart sounds. PULM: CTA B, no wheezes, crackles,  rhonchi. No retractions. No resp. distress. No accessory muscle use. ABD: S, NT, ND. No rebound. No HSM. EXTR: No c/c/e NEURO Normal gait.  PSYCH: Normally interactive. Conversant. Not depressed or anxious appearing.  Calm demeanor.  Normal foot exam today   Orthostatic VS for the past 24 hrs:  BP- Lying Pulse- Lying BP- Sitting Pulse- Sitting BP- Standing at 0 minutes Pulse- Standing at 0 minutes  03/22/18 0906 123/76 76 122/72 82 121/63 85    Her meter 251, ours 250  Assessment and Plan: Type 2 diabetes mellitus with hyperglycemia, without long-term current use of insulin (HCC) - Plan: Hemoglobin A1c, Comprehensive metabolic panel, POCT glucose (manual entry)  Mixed hyperlipidemia  Essential hypertension - Plan: CBC  NASH (nonalcoholic steatohepatitis) - Plan: Comprehensive metabolic panel  Feeling tired - Plan: TSH  Following up today A1c pending, CMP Lipids are UTD Check LFTS today, TSH May need to increase trulicity Confirmed that her glucometer is working well- same as our Pharmacologist today Encourage her to hydrate well as it is very hot outside    [place], MD  Received her labs 6/21, called pt.  Her LFTS are up further, need to refer to GI. Also need to increase her trulicity No answer. Will try back   Called her back 6/24 and spoke with her- she is not taking tylenol, she is not drinking alcohol  Will have her hold her statin for the time being  Come back on 7/5 for LFTS and hep panel lab visit only Increase trulicity to 1.5 mg Results for orders placed or performed in visit on 03/22/18  Hemoglobin A1c  Result Value Ref Range   Hgb A1c MFr Bld 10.7 (H) 4.6 - 6.5 %  Comprehensive metabolic panel  Result Value Ref Range   Sodium 138 135 - 145 mEq/L   Potassium 3.8 3.5 - 5.1 mEq/L   Chloride 101 96 - 112 mEq/L   CO2 26 19 - 32 mEq/L   Glucose, Bld 249 (H) 70 - 99 mg/dL   BUN 10 6 - 23 mg/dL   Creatinine, Ser 0.74 0.40 - 1.20 mg/dL   Total  Bilirubin 0.4 0.2 - 1.2 mg/dL   Alkaline Phosphatase 124 (H) 39 - 117 U/L   AST 172 (H) 0 - 37 U/L   ALT 119 (H) 0 - 35 U/L   Total Protein 6.6 6.0 - 8.3 g/dL   Albumin 3.8 3.5 - 5.2 g/dL   Calcium 9.1 8.4 - 10.5 mg/dL   GFR 87.02 >60.00 mL/min  CBC  Result Value Ref Range   WBC 6.0 4.0 - 10.5 K/uL   RBC 4.67 3.87 - 5.11 Mil/uL   Platelets 292.0 150.0 - 400.0 K/uL   Hemoglobin 13.3 12.0 - 15.0 g/dL   HCT 39.6 36.0 - 46.0 %   MCV 84.8 78.0 - 100.0 fl   MCHC 33.6 30.0 - 36.0 g/dL   RDW 15.6 (H) 11.5 - 15.5 %  TSH  Result Value Ref Range   TSH 1.37 0.35 - 4.50 uIU/mL  POCT glucose (manual entry)  Result Value Ref Range   POC Glucose 250 (A) 70 - 99 mg/dl

## 2018-03-26 MED ORDER — DULAGLUTIDE 1.5 MG/0.5ML ~~LOC~~ SOAJ: SUBCUTANEOUS | 11 refills | Status: DC

## 2018-03-26 NOTE — Addendum Note (Signed)
Addended by: Lamar Blinks C on: 03/26/2018 04:55 PM   Modules accepted: Orders

## 2018-03-27 ENCOUNTER — Telehealth: Payer: Self-pay

## 2018-03-27 DIAGNOSIS — E1165 Type 2 diabetes mellitus with hyperglycemia: Secondary | ICD-10-CM

## 2018-03-27 NOTE — Telephone Encounter (Signed)
Copied from [place] [id number]. Topic: General - Other >> Mar 27, 2018 10:29 AM Carolyn Stare wrote:  Pt will insurance will only cover a 90 day supply so she need a new RX sent to the pharmacy for 90 days  Dulaglutide (TRULICITY) 1.5 JA/2.3QW [place]

## 2018-03-29 MED ORDER — DULAGLUTIDE 1.5 MG/0.5ML ~~LOC~~ SOAJ
SUBCUTANEOUS | 2 refills | Status: DC
Start: 2018-03-29 — End: 2019-03-06

## 2018-04-04 ENCOUNTER — Ambulatory Visit (HOSPITAL_COMMUNITY): Payer: Self-pay | Admitting: Licensed Clinical Social Worker

## 2018-04-06 ENCOUNTER — Other Ambulatory Visit: Payer: BLUE CROSS/BLUE SHIELD

## 2018-04-11 ENCOUNTER — Other Ambulatory Visit: Payer: BLUE CROSS/BLUE SHIELD

## 2018-04-17 ENCOUNTER — Ambulatory Visit (HOSPITAL_COMMUNITY): Payer: BLUE CROSS/BLUE SHIELD | Admitting: Psychiatry

## 2018-04-19 ENCOUNTER — Emergency Department (HOSPITAL_BASED_OUTPATIENT_CLINIC_OR_DEPARTMENT_OTHER)
Admission: EM | Admit: 2018-04-19 | Discharge: 2018-04-19 | Disposition: A | Discharge status: Home / Self Care | Payer: BLUE CROSS/BLUE SHIELD | Attending: Emergency Medicine | Admitting: Emergency Medicine

## 2018-04-19 ENCOUNTER — Emergency Department (HOSPITAL_BASED_OUTPATIENT_CLINIC_OR_DEPARTMENT_OTHER): Payer: BLUE CROSS/BLUE SHIELD

## 2018-04-19 ENCOUNTER — Other Ambulatory Visit: Payer: Self-pay

## 2018-04-19 ENCOUNTER — Encounter (HOSPITAL_BASED_OUTPATIENT_CLINIC_OR_DEPARTMENT_OTHER): Payer: Self-pay | Admitting: Emergency Medicine

## 2018-04-19 DIAGNOSIS — E119 Type 2 diabetes mellitus without complications: Secondary | ICD-10-CM | POA: Diagnosis not present

## 2018-04-19 DIAGNOSIS — R0602 Shortness of breath: Secondary | ICD-10-CM | POA: Diagnosis present

## 2018-04-19 DIAGNOSIS — Z85828 Personal history of other malignant neoplasm of skin: Secondary | ICD-10-CM | POA: Insufficient documentation

## 2018-04-19 DIAGNOSIS — Z91040 Latex allergy status: Secondary | ICD-10-CM | POA: Diagnosis not present

## 2018-04-19 DIAGNOSIS — Z79899 Other long term (current) drug therapy: Secondary | ICD-10-CM | POA: Diagnosis not present

## 2018-04-19 DIAGNOSIS — I10 Essential (primary) hypertension: Secondary | ICD-10-CM | POA: Insufficient documentation

## 2018-04-19 DIAGNOSIS — H02843 Edema of right eye, unspecified eyelid: Secondary | ICD-10-CM | POA: Insufficient documentation

## 2018-04-19 MED ORDER — DIPHENHYDRAMINE HCL 25 MG PO CAPS
25.0000 mg | ORAL_CAPSULE | Freq: Once | ORAL | Status: AC
  Administered 2018-04-19: 25 mg via ORAL
  Filled 2018-04-19: qty 1

## 2018-04-19 NOTE — ED Notes (Signed)
ED Provider at bedside, Dr. Rex Kras.

## 2018-04-19 NOTE — ED Triage Notes (Signed)
Pt reports starting ceftin for URI yesterday. Pt c/o sob, light headed and swelling to eyes. Pt reports similar symptoms when taking this abx previously.

## 2018-04-19 NOTE — ED Notes (Signed)
ED Provider at bedside. 

## 2018-04-19 NOTE — Discharge Instructions (Addendum)
Please use afrin and a decongestant (like sudafed) for 2-3 days. Do not use longer than this. You can also use cetirizine for 1-2 weeks to help with drainage. Please use sinus washes as instructed by your ENT. Please follow up with your ENT to have them assess your sinuses, but you do not appear to have an infection today. Your eye swelling does not appear to be allergic. Please follow up with your eye doctor to check on this over the next few days and also apply ice at home.

## 2018-04-19 NOTE — ED Provider Notes (Signed)
[facility]PART[facility] Provider Note   CSN: [id number] Arrival date & time: 04/19/18  7062     History   Chief Complaint Chief Complaint  Patient presents with  . Shortness of Breath    HPI Natalie Richard is a 52 y.o. female.  HPI  Patient presents today with right-sided eyelid swelling and allergic concern.  She states that she received a prescription from her ENT 1 week ago, and filled it yesterday.  She states she took 1 pill of Ceftin last night around 9 PM due to green sinus drainage and cough.  Immediately thereafter, she felt like the pill was stuck in her throat around her sternum and felt some associated chest pain and shortness of breath.  She continued to drink more and felt this resolved.  This morning she explicitly denies chest pain.  Of note, her husband is with her and states he has bronchitis, sinus infection, ear infection and is coughing in the room.  Patient states that she has poorly controlled diabetes.  She also is allergic to several other antibiotics.  Has not tried anything for her eyelid swelling.  No fever, weakness, dysuria, bowel changes.  Past Medical History:  Diagnosis Date  . Anxiety   . 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 Depression   . Diabetes mellitus   . Diabetes mellitus, type II ([place])   . GERD (gastroesophageal reflux disease)   . Hematoma    on buttock  . Hyperlipidemia   . Hypertension   . Migraine   . Seasonal allergies     Patient Active Problem List   Diagnosis Date Noted  . NASH (nonalcoholic steatohepatitis) 07/26/2015  . BMI 39.0-39.9,adult 03/19/2012  . Hematoma 03/19/2012  . HTN (hypertension), benign 01/09/2012  . Osteopenia 01/09/2012  . Migraines 01/09/2012  . Insomnia 01/09/2012  . Anxiety and depression 01/09/2012  . Hyperlipidemia 01/09/2012  . Diabetes mellitus 01/09/2012  . Allergic rhinitis 01/09/2012    Past Surgical History:  Procedure Laterality Date  .  ABDOMINAL HYSTERECTOMY  2004   PCOS  . EYE SURGERY    . INNER EAR SURGERY Left 08/2016     OB History   None      Home Medications    Prior to Admission medications   Medication Sig Start Date End Date Taking? Authorizing Provider  azelastine (ASTELIN) 0.1 % nasal spray Place 2 sprays into both nostrils 2 (two) times daily. Patient taking differently: Place 2 sprays into both nostrils as needed.  02/20/17   Colon Branch, MD  citalopram (CELEXA) 40 MG tablet Take 1 tablet (40 mg total) by mouth daily. 02/19/18 02/19/19  Aundra Dubin, MD  clonazePAM (KLONOPIN) 0.5 MG tablet Take 1 tablet (0.5 mg total) by mouth 2 (two) times daily. 02/19/18 02/19/19  Aundra Dubin, MD  dexlansoprazole (DEXILANT) 60 MG capsule Take 1 capsule (60 mg total) by mouth daily. 01/16/18   Copland, Gay Filler, MD  Dulaglutide (TRULICITY) 1.5 BJ/6.2GB SOPN Inject 1.5 mg per week for diabetes 03/29/18   Copland, Gay Filler, MD  fluticasone (FLONASE) 50 MCG/ACT nasal spray as needed. 04/11/17   [provider]  folic acid (FOLVITE) 1 MG tablet TAKE 1 TABLET (1 MG TOTAL) BY MOUTH DAILY 03/29/17   Copland, Gay Filler, MD  glucose blood test strip Use to check glucose 1-2x daily 06/14/17   Copland, Gay Filler, MD  HYDROcodone-homatropine (HYCODAN) 5-1.5 MG/5ML syrup Take 5 mLs by mouth at  bedtime as needed for cough. Patient not taking: Reported on 03/22/2018 12/18/17   Colon Branch, MD  hydrOXYzine (VISTARIL) 25 MG capsule Take 1 capsule (25 mg total) by mouth 3 (three) times daily as needed for anxiety (sleep). 02/19/18   Eksir, Richard Miu, MD  lisinopril-hydrochlorothiazide (PRINZIDE,ZESTORETIC) 10-12.5 MG tablet Take 1/2 tablet once a day.  May increase to whole tablet if directed by MD 04/21/17   Copland, Gay Filler, MD  loratadine (CLARITIN) 10 MG tablet Take 1 tablet (10 mg total) by mouth daily. 01/26/16   Leandrew Koyanagi, MD  Multiple Vitamins-Minerals (MULTIVITAMIN WITH MINERALS) tablet Take 1  tablet by mouth daily.    [provider]  Omega-3 Fatty Acids (FISH OIL) 1000 MG CAPS Take by mouth.    [provider]  pioglitazone (ACTOS) 30 MG tablet TAKE 1 TABLET BY MOUTH EVERY DAY 02/15/18   Copland, Gay Filler, MD  rosuvastatin (CRESTOR) 40 MG tablet TAKE 1 TABLET (40 MG TOTAL) BY MOUTH DAILY. 01/30/17   Copland, Gay Filler, MD  S-Adenosylmethionine (SAME PO) Take 1 tablet by mouth.     [provider]    Family History Family History  Problem Relation Age of Onset  . Cancer Mother 62       colon  . Diabetes Mother   . Rheum arthritis Mother   . Dementia Mother   . Depression Mother   . Cancer Father 71       bone and prostate  . Cancer Maternal Grandmother        breast  . Diabetes Maternal Grandfather   . Depression Cousin     Social History Social History   Tobacco Use  . Smoking status: Never Smoker  . Smokeless tobacco: Never Used  Substance Use Topics  . Alcohol use: Yes    Alcohol/week: 4.8 - 6.0 oz    Types: 6 Cans of beer, 2 Shots of liquor per week    Comment: weekly  . Drug use: No     Allergies   Buspar [buspirone]; Augmentin [amoxicillin-pot clavulanate]; Biaxin [clarithromycin]; Ciprofloxacin; Doxycycline; Levaquin [levofloxacin]; Metformin and related; Pyridium [phenazopyridine hcl]; Sulfa antibiotics; Latex; and Lorabid [loracarbef]   Review of Systems Review of Systems  Constitutional: Negative for activity change, appetite change, chills, fatigue and fever.  HENT: Positive for congestion, facial swelling, rhinorrhea and sinus pain. Negative for ear pain and sore throat.   Respiratory: Positive for cough. Negative for shortness of breath and wheezing.   Gastrointestinal: Positive for nausea. Negative for diarrhea and vomiting.  Genitourinary: Negative for dysuria.  Skin: Negative for rash.  Neurological: Negative for speech difficulty and numbness.  All other systems reviewed and are negative.    Physical  Exam Updated Vital Signs BP 121/86 (BP Location: Left Arm)   Pulse 78   Temp 98 F (36.7 C) (Oral)   Resp 14   Ht 5' 6"  (1.676 m)   Wt 111.1 kg (245 lb)   SpO2 95%   BMI 39.54 kg/m   Physical Exam  Constitutional: She is oriented to person, place, and time. She appears well-developed and well-nourished. She does not appear ill. No distress.  HENT:  Head: Normocephalic and atraumatic.  Mouth/Throat: Oropharynx is clear and moist. No posterior oropharyngeal edema.  Eyes: Pupils are equal, round, and reactive to light. EOM are normal.  Swelling of R upper eyelid, no overlying erythema, no conjunctival injection, no pain with extraocular movements.   Neck: Normal range of motion. Neck supple.  Cardiovascular:  Normal rate, regular rhythm and normal heart sounds.  No murmur heard. Pulmonary/Chest: Effort normal and breath sounds normal.  Abdominal: Soft. Bowel sounds are normal. There is no tenderness.  Musculoskeletal:       Right lower leg: Normal. She exhibits no edema.       Left lower leg: Normal. She exhibits no edema.  Neurological: She is alert and oriented to person, place, and time. No cranial nerve deficit.  Skin: Skin is warm and dry. Capillary refill takes less than 2 seconds. No rash noted.  Psychiatric: She has a normal mood and affect.     ED Treatments / Results  Labs (all labs ordered are listed, but only abnormal results are displayed) Labs Reviewed - No data to display  EKG EKG Interpretation  Date/Time:  Thursday April 19 2018 07:06:11 EDT Ventricular Rate:  83 PR Interval:    QRS Duration: 95 QT Interval:  395 QTC Calculation: 465 R Axis:   -26 Text Interpretation:  Sinus rhythm Borderline left axis deviation Abnormal R-wave progression, early transition Borderline T abnormalities, inferior leads No previous ECGs available Confirmed by Theotis Burrow [phone]) on 04/19/2018 7:12:13 AM   Radiology Dg Chest 2 View  Result Date: 04/19/2018 CLINICAL  DATA:  Shortness of breath. EXAM: CHEST - 2 VIEW COMPARISON:  12/17/2014 FINDINGS: Lungs are clear without focal airspace disease or pulmonary edema. Heart and mediastinum are within normal limits. Trachea is midline. No large effusions. No suspicious bone findings. IMPRESSION: No active cardiopulmonary disease. Electronically Signed   By: Markus Daft M.D.   On: 04/19/2018 07:42    Procedures Procedures (including critical care time)  Medications Ordered in ED Medications  diphenhydrAMINE (BENADRYL) capsule 25 mg (25 mg Oral Given 04/19/18 0740)     Initial Impression / Assessment and Plan / ED Course  I have reviewed the triage vital signs and the nursing notes.  Pertinent labs & imaging results that were available during my care of the patient were reviewed by me and considered in my medical decision making (see chart for details).     Patient with right eyelid swelling, stable for the last 10 hours.  Does not appear infectious in etiology has no conjunctival injection, crusting, pain.  Likely allergic component.  No evolving rash, shortness of breath, nausea or other system involvement.  Anaphylaxis is extremely unlikely with this presentation.  Patient has not tried anything prior to arrival.  Will try Benadryl and ice.  Presentation not suggestive of acute sinusitis, no sinus tenderness despite reported green drainage.  Chest x-ray clear.  Chest pain was exclusively related to swallowing her antibiotic and has not persisted or recurred.  Will discharge with supportive care including short course of Afrin and decongestant, can try antihistamine for nasal congestion.  She should follow-up with her ENT before any additional courses of antibiotics.  Follow-up with her eye doctor if persistent swelling of the eyelid.  Final Clinical Impressions(s) / ED Diagnoses   Final diagnoses:  Swelling of right eyelid    ED Discharge Orders    None       Sela Hilding, MD 04/19/18 [place], Wenda Overland, MD 04/22/18 [phone]

## 2018-04-19 NOTE — ED Notes (Signed)
Patient transported to X-ray 

## 2018-04-25 ENCOUNTER — Encounter: Payer: Self-pay | Admitting: Family Medicine

## 2018-04-25 ENCOUNTER — Ambulatory Visit: Payer: BLUE CROSS/BLUE SHIELD | Admitting: Family Medicine

## 2018-04-25 VITALS — BP 108/80 | HR 81 | Temp 98.3°F | Resp 16 | Ht 66.0 in | Wt 241.0 lb

## 2018-04-25 DIAGNOSIS — K7581 Nonalcoholic steatohepatitis (NASH): Secondary | ICD-10-CM

## 2018-04-25 DIAGNOSIS — R945 Abnormal results of liver function studies: Secondary | ICD-10-CM

## 2018-04-25 DIAGNOSIS — I10 Essential (primary) hypertension: Secondary | ICD-10-CM | POA: Diagnosis not present

## 2018-04-25 DIAGNOSIS — R7401 Elevation of levels of liver transaminase levels: Secondary | ICD-10-CM

## 2018-04-25 DIAGNOSIS — R0602 Shortness of breath: Secondary | ICD-10-CM

## 2018-04-25 DIAGNOSIS — E1165 Type 2 diabetes mellitus with hyperglycemia: Secondary | ICD-10-CM

## 2018-04-25 DIAGNOSIS — Z09 Encounter for follow-up examination after completed treatment for conditions other than malignant neoplasm: Secondary | ICD-10-CM

## 2018-04-25 DIAGNOSIS — R7989 Other specified abnormal findings of blood chemistry: Secondary | ICD-10-CM

## 2018-04-25 DIAGNOSIS — R740 Nonspecific elevation of levels of transaminase and lactic acid dehydrogenase [LDH]: Secondary | ICD-10-CM

## 2018-04-25 DIAGNOSIS — H02849 Edema of unspecified eye, unspecified eyelid: Secondary | ICD-10-CM

## 2018-04-25 DIAGNOSIS — R5381 Other malaise: Secondary | ICD-10-CM

## 2018-04-25 DIAGNOSIS — J0111 Acute recurrent frontal sinusitis: Secondary | ICD-10-CM

## 2018-04-25 LAB — POCT URINALYSIS DIP (MANUAL ENTRY)
BILIRUBIN UA: NEGATIVE mg/dL
Bilirubin, UA: NEGATIVE
Blood, UA: NEGATIVE
Glucose, UA: NEGATIVE mg/dL
LEUKOCYTES UA: NEGATIVE
NITRITE UA: NEGATIVE
PH UA: 6 (ref 5.0–8.0)
Spec Grav, UA: 1.025 (ref 1.010–1.025)
Urobilinogen, UA: 0.2 E.U./dL

## 2018-04-25 LAB — D-DIMER, QUANTITATIVE (NOT AT ARMC): D DIMER QUANT: 0.45 ug{FEU}/mL (ref <0.50)

## 2018-04-25 LAB — COMPREHENSIVE METABOLIC PANEL
ALBUMIN: 4.1 g/dL (ref 3.5–5.2)
ALK PHOS: 134 U/L — AB (ref 39–117)
ALT: 125 U/L — AB (ref 0–35)
AST: 167 U/L — AB (ref 0–37)
BILIRUBIN TOTAL: 0.4 mg/dL (ref 0.2–1.2)
BUN: 8 mg/dL (ref 6–23)
CALCIUM: 9.6 mg/dL (ref 8.4–10.5)
CO2: 29 mEq/L (ref 19–32)
CREATININE: 0.87 mg/dL (ref 0.40–1.20)
Chloride: 97 mEq/L (ref 96–112)
GFR: 72.17 mL/min (ref >60.00)
Glucose, Bld: 230 mg/dL — ABNORMAL HIGH (ref 70–99)
Potassium: 4.1 mEq/L (ref 3.5–5.1)
SODIUM: 139 meq/L (ref 135–145)
TOTAL PROTEIN: 7.2 g/dL (ref 6.0–8.3)

## 2018-04-25 LAB — CBC
HCT: 41.6 % (ref 36.0–46.0)
Hemoglobin: 13.7 g/dL (ref 12.0–15.0)
MCHC: 32.9 g/dL (ref 30.0–36.0)
MCV: 85.8 fl (ref 78.0–100.0)
Platelets: 293 10*3/uL (ref 150.0–400.0)
RBC: 4.84 Mil/uL (ref 3.87–5.11)
RDW: 16.2 % — ABNORMAL HIGH (ref 11.5–15.5)
WBC: 6.4 10*3/uL (ref 4.0–10.5)

## 2018-04-25 LAB — GLUCOSE, POCT (MANUAL RESULT ENTRY): POC Glucose: 219 mg/dl — AB (ref 70–99)

## 2018-04-25 LAB — BRAIN NATRIURETIC PEPTIDE: PRO B NATRI PEPTIDE: 17 pg/mL (ref 0.0–100.0)

## 2018-04-25 MED ORDER — AMOXICILLIN 500 MG PO CAPS: 1000.0000 mg | ORAL_CAPSULE | Freq: Two times a day (BID) | ORAL | 0 refills | Status: DC

## 2018-04-25 NOTE — Progress Notes (Signed)
[facility] [address] [place], [place] [address] [phone]  Date:  04/25/2018   Name:  Natalie Richard   DOB:  December 11, 1965   MRN:  [id number]  PCP:  Darreld Mclean, MD    Chief Complaint: Follow-up (ENT called in antibiotic for sinus infection-allergic reaction to medication, trouble breathing, eye swelling) and Diabetes (sugar still running high, dizziness, shortness of breath, for several months)   History of Present Illness:  Natalie Richard is a 52 y.o. very pleasant female patient who presents with the following: Following up from the ER today History of DM, NASH, obesity Dm has been uncontrolled, we just recently increased her trulicity dose Lab Results  Component Value Date   HGBA1C 10.7 (H) 03/22/2018    Last seen by myself last month: A1c pending, CMP Lipids are UTD Check LFTS today, TSH May need to increase trulicity Confirmed that her glucometer is working well- same as our Biomedical scientist her to hydrate well as it is very hot outside //////////////////////////////// Her LFTS are up further, need to refer to GI. Also need to increase her trulicity Called her back 6/24 and spoke with her- she is not taking tylenol, she is not drinking alcohol  Will have her hold her statin for the time being  Come back on 7/5 for LFTS and hep panel lab visit only Increase trulicity to 1.5 mg  Need to draw hep panel and LFTs today, already ordered   Recent ER visit for eye swelling, 7/18-  Patient with right eyelid swelling, stable for the last 10 hours.  Does not appear infectious in etiology has no conjunctival injection, crusting, pain.  Likely allergic component.  No evolving rash, shortness of breath, nausea or other system involvement.  Anaphylaxis is extremely unlikely with this presentation.  Patient has not tried anything prior to arrival.  Will try Benadryl and ice.  Presentation not  suggestive of acute sinusitis, no sinus tenderness despite reported green drainage. Chest x-ray clear.  Chest pain was exclusively related to swallowing her antibiotic and has not persisted or recurred.  Will discharge with supportive care including short course of Afrin and decongestant, can try antihistamine for nasal congestion.  She should follow-up with her ENT before any additional courses of antibiotics.  Follow-up with her eye doctor if persistent swelling of the eyelid.  Her ENT called in abx for fer for a sinus infection - ceftin- for her and she started this a week ago. However shortly after she started it she went to there ER and they told her to stop the abx.  She only took it for one dose   However she seemed to be getting worse - she has felt tired, no energy, has noted SOB with exertion Her glucose is running about 290 still  She has increased her trulicity  She has not noted any fever  No labs done in the ER She still has an occasional cough and some sinus pressure  Her ears are stopped up Some diarrhea and nausea but no vomiting Her swollen eyelid went away   She is on trulicity and actos but that is all for her DM Discussed insulin but she is not ready to make this step as of yet  Lisinopril/hctz for BP  celexa Klonopin dexilant crestor   Dg Chest 2 View  Result Date: 04/19/2018 CLINICAL DATA:  Shortness of breath. EXAM: CHEST - 2 VIEW COMPARISON:  12/17/2014 FINDINGS: Lungs are clear without focal airspace disease or pulmonary edema. Heart and mediastinum are within normal limits. Trachea is midline. No large effusions. No suspicious bone findings. IMPRESSION: No active cardiopulmonary disease. Electronically Signed   By: Markus Daft M.D.   On: 04/19/2018 07:42   She did try to take [place] but did not tolerate- she cannot remember what happened with this.   BP Readings from Last 3 Encounters:  04/25/18 108/80  04/19/18 121/86  03/22/18 112/78   She has used amox  without difficulty many times, most recently in 2017.  She does have intolerance to augmentin and recnet possible reaction to cepahlosporin.   She has felt SOB with activity for one month- no acute change.  No Leg swelling No CP  No history of DVT or PE   She has had EKG and CXR in the ER already which are reviewed today Patient Active Problem List   Diagnosis Date Noted  . NASH (nonalcoholic steatohepatitis) 07/26/2015  . BMI 39.0-39.9,adult 03/19/2012  . Hematoma 03/19/2012  . HTN (hypertension), benign 01/09/2012  . Osteopenia 01/09/2012  . Migraines 01/09/2012  . Insomnia 01/09/2012  . Anxiety and depression 01/09/2012  . Hyperlipidemia 01/09/2012  . Diabetes mellitus 01/09/2012  . Allergic rhinitis 01/09/2012    Past Medical History:  Diagnosis Date  . Anxiety   . 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 Depression   . Diabetes mellitus   . Diabetes mellitus, type II ([place])   . GERD (gastroesophageal reflux disease)   . Hematoma    on buttock  . Hyperlipidemia   . Hypertension   . Migraine   . Seasonal allergies     Past Surgical History:  Procedure Laterality Date  . ABDOMINAL HYSTERECTOMY  2004   PCOS  . EYE SURGERY    . INNER EAR SURGERY Left 08/2016    Social History   Tobacco Use  . Smoking status: Never Smoker  . Smokeless tobacco: Never Used  Substance Use Topics  . Alcohol use: Yes    Alcohol/week: 4.8 - 6.0 oz    Types: 6 Cans of beer, 2 Shots of liquor per week    Comment: weekly  . Drug use: No    Family History  Problem Relation Age of Onset  . Cancer Mother 80       colon  . Diabetes Mother   . Rheum arthritis Mother   . Dementia Mother   . Depression Mother   . Cancer Father 67       bone and prostate  . Cancer Maternal Grandmother        breast  . Diabetes Maternal Grandfather   . Depression Cousin     Allergies  Allergen Reactions  . Buspar [Buspirone] Other (See Comments)    Lip swelling and rash numbness    . Augmentin [Amoxicillin-Pot Clavulanate] Nausea And Vomiting  . Biaxin [Clarithromycin]   . Ceftin [Cefuroxime] Swelling    Eye swelling, shortness of breath   . Ciprofloxacin     Developed itching with IV form in the hospital, but has since taken PO without a problem  . Doxycycline Itching  . Levaquin [Levofloxacin] Hives  . Metformin And Related Nausea And Vomiting  . Pyridium [Phenazopyridine Hcl]     Does not remember reaction  . Sulfa Antibiotics     As a younger person had itching, but has taken more recently and did ok  . Latex Rash  . Lorabid [  Loracarbef] Rash    Medication list has been reviewed and updated.  Current Outpatient Medications on File Prior to Visit  Medication Sig Dispense Refill  . azelastine (ASTELIN) 0.1 % nasal spray Place 2 sprays into both nostrils 2 (two) times daily. (Patient taking differently: Place 2 sprays into both nostrils as needed. ) 30 mL 6  . citalopram (CELEXA) 40 MG tablet Take 1 tablet (40 mg total) by mouth daily. 90 tablet 0  . clonazePAM (KLONOPIN) 0.5 MG tablet Take 1 tablet (0.5 mg total) by mouth 2 (two) times daily. 180 tablet 0  . dexlansoprazole (DEXILANT) 60 MG capsule Take 1 capsule (60 mg total) by mouth daily. 90 capsule 1  . Dulaglutide (TRULICITY) 1.5 MV/7.8IO SOPN Inject 1.5 mg per week for diabetes 12 pen 2  . fluticasone (FLONASE) 50 MCG/ACT nasal spray as needed.    . folic acid (FOLVITE) 1 MG tablet TAKE 1 TABLET (1 MG TOTAL) BY MOUTH DAILY 90 tablet 3  . glucose blood test strip Use to check glucose 1-2x daily 100 each 12  . lisinopril-hydrochlorothiazide (PRINZIDE,ZESTORETIC) 10-12.5 MG tablet Take 1/2 tablet once a day.  May increase to whole tablet if directed by MD 90 tablet 3  . loratadine (CLARITIN) 10 MG tablet Take 1 tablet (10 mg total) by mouth daily. 90 tablet 3  . Multiple Vitamins-Minerals (MULTIVITAMIN WITH MINERALS) tablet Take 1 tablet by mouth daily.    . Omega-3 Fatty Acids (FISH OIL) 1000 MG CAPS  Take by mouth.    . pioglitazone (ACTOS) 30 MG tablet TAKE 1 TABLET BY MOUTH EVERY DAY 90 tablet 1  . rosuvastatin (CRESTOR) 40 MG tablet TAKE 1 TABLET (40 MG TOTAL) BY MOUTH DAILY. 90 tablet 3   No current facility-administered medications on file prior to visit.     Review of Systems:  As per HPI- otherwise negative.   Physical Examination: Vitals:   04/25/18 1000  BP: 108/80  Pulse: 81  Resp: 16  Temp: 98.3 F (36.8 C)  SpO2: 95%   Vitals:   04/25/18 1000  Weight: 241 lb (109.3 kg)  Height: 5' 6"  (1.676 m)   Body mass index is 38.9 kg/m. Ideal Body Weight: Weight in (lb) to have BMI = 25: 154.6  GEN: WDWN, NAD, Non-toxic, A & O x 3, obese, otherwise looks well  HEENT: Atraumatic, Normocephalic. Neck supple. No masses, No LAD.  Bilateral TM wnl, oropharynx normal.  PEERL,EOMI.   No eyelid swelling at the moment  Ears and Nose: No external deformity. CV: RRR, No M/G/R. No JVD. No thrill. No extra heart sounds. PULM: CTA B, no wheezes, crackles, rhonchi. No retractions. No resp. distress. No accessory muscle use. ABD: S, NT, ND, +BS. No rebound. No HSM.  Belly is benign EXTR: No c/c/e NEURO Normal gait.  PSYCH: Normally interactive. Conversant. Not depressed or anxious appearing.  Calm demeanor.    Assessment and Plan: Hospital discharge follow-up  Type 2 diabetes mellitus with hyperglycemia, without long-term current use of insulin ([place]) - Plan: POCT glucose (manual entry), CBC, Comprehensive metabolic panel  HTN (hypertension), benign  NASH (nonalcoholic steatohepatitis)  Elevated LFTs  Eyelid gland swelling, unspecified laterality  Malaise - Plan: Urine Culture, POCT urinalysis dipstick  Acute recurrent frontal sinusitis - Plan: amoxicillin (AMOXIL) 500 MG capsule  SOB (shortness of breath) - Plan: B Nat Peptide, D-Dimer, Quantitative  Following up from an ER visit today- she was seen with questionable allergic reaction to Ceftin and eye swelling. This  is now  resolved, but she continues to feel poorly overall and to complain of fatigue and SOB She had an EKG and CXR in the ER already Will treat with a course of plain amox for likely sinus infection She has history of NASH and recently worsening LFTs - this may be why she is feeling poorly.  Will repeat her liver tests today and plan to refer to GI if needed Will also check BNP and D dimer due to her SOB complaint  We have increased her trulicity but pt still has high glucose.  Discussed adding insulin but she is not ready to do this.  Plan to repeat A1c in 2 months and see where we are She is intolerant to metformin and farxiga   Signed Lamar Blinks, MD  Called and left her a message on he machine.  No sign of CHF or PE on her labs.  It may be that her liver is why she is feeling bad.  I am going to refer her to GI for eval Please seek care if not doing ok.  Otherwise let's visit in 2 months for DM recheck   Results for orders placed or performed in visit on 04/25/18  CBC  Result Value Ref Range   WBC 6.4 4.0 - 10.5 K/uL   RBC 4.84 3.87 - 5.11 Mil/uL   Platelets 293.0 150.0 - 400.0 K/uL   Hemoglobin 13.7 12.0 - 15.0 g/dL   HCT 41.6 36.0 - 46.0 %   MCV 85.8 78.0 - 100.0 fl   MCHC 32.9 30.0 - 36.0 g/dL   RDW 16.2 (H) 11.5 - 15.5 %  Comprehensive metabolic panel  Result Value Ref Range   Sodium 139 135 - 145 mEq/L   Potassium 4.1 3.5 - 5.1 mEq/L   Chloride 97 96 - 112 mEq/L   CO2 29 19 - 32 mEq/L   Glucose, Bld 230 (H) 70 - 99 mg/dL   BUN 8 6 - 23 mg/dL   Creatinine, Ser 0.87 0.40 - 1.20 mg/dL   Total Bilirubin 0.4 0.2 - 1.2 mg/dL   Alkaline Phosphatase 134 (H) 39 - 117 U/L   AST 167 (H) 0 - 37 U/L   ALT 125 (H) 0 - 35 U/L   Total Protein 7.2 6.0 - 8.3 g/dL   Albumin 4.1 3.5 - 5.2 g/dL   Calcium 9.6 8.4 - 10.5 mg/dL   GFR 72.17 >60.00 mL/min  B Nat Peptide  Result Value Ref Range   Pro B Natriuretic peptide (BNP) 17.0 0.0 - 100.0 pg/mL  D-Dimer, Quantitative  Result  Value Ref Range   D-Dimer, Quant 0.45 <0.50 mcg/mL FEU  POCT urinalysis dipstick  Result Value Ref Range   Color, UA yellow yellow   Clarity, UA clear clear   Glucose, UA negative negative mg/dL   Bilirubin, UA negative negative   Ketones, POC UA negative negative mg/dL   Spec Grav, UA 1.025 1.010 - 1.025   Blood, UA negative negative   pH, UA 6.0 5.0 - 8.0   Protein Ur, POC trace (A) negative mg/dL   Urobilinogen, UA 0.2 0.2 or 1.0 E.U./dL   Nitrite, UA Negative Negative   Leukocytes, UA Negative Negative  POCT glucose (manual entry)  Result Value Ref Range   POC Glucose 219 (A) 70 - 99 mg/dl

## 2018-04-25 NOTE — Patient Instructions (Addendum)
We will get labs for you today- will check your blood counts, metabolic profile, heart and lungs.  If anything is urgent we will act on it.  othewise plan to treat you for a sinus infection with amoxicillin.  If any sign of allergic reaction stop using this med and take benadryl, seek further care as needed  Continue to work hard on your diet and exercise as you are able.  If your A1c is not improved at next check we will think about insulin. Please see me in 2 months for a repeat A1c/ diabetes check If you are not doing ok- if you are acutely worse- please seek immediate care

## 2018-04-26 ENCOUNTER — Telehealth: Payer: Self-pay

## 2018-04-26 LAB — URINE CULTURE
MICRO NUMBER:: 90875815
SPECIMEN QUALITY:: ADEQUATE

## 2018-04-26 NOTE — Telephone Encounter (Signed)
Copied from [place] [id number]. Topic: General - Other >> Apr 26, 2018 10:29 AM Natalie Richard wrote:  Pt call to say she would like a call back about why she is being sent to GI doctor about her liver enzymes  [phone]

## 2018-04-26 NOTE — Telephone Encounter (Signed)
Called and left detailed message- we are doing referral to GI as she has complaint of not feeling well, and her liver tests have been more persistently elevated than in the past  Also would like her to not take her crestor until she is seen by GI please

## 2018-04-27 ENCOUNTER — Telehealth: Payer: Self-pay | Admitting: *Deleted

## 2018-04-27 DIAGNOSIS — R740 Nonspecific elevation of levels of transaminase and lactic acid dehydrogenase [LDH]: Principal | ICD-10-CM

## 2018-04-27 DIAGNOSIS — R7401 Elevation of levels of liver transaminase levels: Secondary | ICD-10-CM

## 2018-04-27 NOTE — Telephone Encounter (Signed)
Patient already se's Dr. Man for GI with [facility] and would like her results sent to him. Patient does not have their contact info. Patient has stopped Crestor. Please advise.

## 2018-04-27 NOTE — Telephone Encounter (Signed)
That is fine- please fax most recent office visit notes and labs to Dr. Collene Mares.  Will call pt and ask her to set up an appt there at her convenience

## 2018-04-27 NOTE — Addendum Note (Signed)
Addended by: Harl Bowie on: 04/27/2018 11:23 AM   Modules accepted: Orders

## 2018-04-27 NOTE — Telephone Encounter (Signed)
Called and spoke with Kenney Houseman at [facility] lab requesting for the tube of blood be sent to [facility].  To have the Acute Hep Panel ran.  Test ordered and released and E-Req faxed to [facility] lab to be sent to [facility].  Confirmation received.//AB/CMA

## 2018-04-27 NOTE — Telephone Encounter (Signed)
-----   Message from Darreld Mclean, MD sent at 04/25/2018  6:24 PM EDT ----- Can we add on an acute hep panel for her, dx transaminitis? Thank you

## 2018-04-28 LAB — HEPATITIS PANEL, ACUTE
HEP A IGM: NONREACTIVE
HEP B S AG: NONREACTIVE
Hep B C IgM: NONREACTIVE
Hepatitis C Ab: NONREACTIVE
SIGNAL TO CUT-OFF: 0.02 (ref <1.00)

## 2018-04-30 NOTE — Telephone Encounter (Signed)
Most recent office visit notes and labs faxed to Dr. Youlanda Mighty' office per providers request..

## 2018-05-06 ENCOUNTER — Other Ambulatory Visit: Payer: Self-pay | Admitting: Family Medicine

## 2018-05-10 ENCOUNTER — Ambulatory Visit (HOSPITAL_COMMUNITY): Payer: BLUE CROSS/BLUE SHIELD | Admitting: Psychiatry

## 2018-05-10 ENCOUNTER — Encounter (HOSPITAL_COMMUNITY): Payer: Self-pay | Admitting: Psychiatry

## 2018-05-10 DIAGNOSIS — F410 Panic disorder [episodic paroxysmal anxiety] without agoraphobia: Secondary | ICD-10-CM | POA: Diagnosis not present

## 2018-05-10 DIAGNOSIS — Z818 Family history of other mental and behavioral disorders: Secondary | ICD-10-CM | POA: Diagnosis not present

## 2018-05-10 DIAGNOSIS — F3341 Major depressive disorder, recurrent, in partial remission: Secondary | ICD-10-CM | POA: Diagnosis not present

## 2018-05-10 MED ORDER — CLONAZEPAM 0.5 MG PO TABS: 0.5000 mg | ORAL_TABLET | Freq: Two times a day (BID) | ORAL | 0 refills | Status: DC

## 2018-05-10 MED ORDER — CITALOPRAM HYDROBROMIDE 40 MG PO TABS: 40.0000 mg | ORAL_TABLET | Freq: Every day | ORAL | 0 refills | Status: DC
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
## 2018-06-19 ENCOUNTER — Other Ambulatory Visit: Payer: Self-pay | Admitting: Family Medicine

## 2018-06-19 DIAGNOSIS — R809 Proteinuria, unspecified: Secondary | ICD-10-CM

## 2018-07-04 ENCOUNTER — Ambulatory Visit: Payer: Self-pay | Admitting: Family Medicine

## 2018-07-08 NOTE — Progress Notes (Deleted)
[place] at [facility] [address], [place], [place] [address] [phone]  Date:  07/11/2018   Name:  Natalie Richard   DOB:  17-Jan-1966   MRN:  [id number]  PCP:  Darreld Mclean, MD    Chief Complaint: No chief complaint on file.   History of Present Illness:  Natalie Richard is a 52 y.o. very pleasant female patient who presents with the following:  Short term follow-up today History of obesity, HTN, NASH, hyperlipidemia, DM I saw her in July for an ER follow-up and continued malaise:  Following up from an ER visit today- she was seen with questionable allergic reaction to Ceftin and eye swelling. This is now resolved, but she continues to feel poorly overall and to complain of fatigue and SOB She had an EKG and CXR in the ER already Will treat with a course of plain amox for likely sinus infection She has history of NASH and recently worsening LFTs - this may be why she is feeling poorly.  Will repeat her liver tests today and plan to refer to GI if needed Will also check BNP and D dimer due to her SOB complaint  We have increased her trulicity but pt still has high glucose.  Discussed adding insulin but she is not ready to do this.  Plan to repeat A1c in 2 months and see where we are She is intolerant to metformin and farxiga///////////////////////////////// Called and left her a message on he machine.  No sign of CHF or PE on her labs.  It may be that her liver is why she is feeling bad.  I am going to refer her to GI for eval Please seek care if not doing ok.  Otherwise let's visit in 2 months for DM recheck   ?did she see GI.  I placed a referral for her but it looks like she might have another GI doc already She was noted to have significant transaminitis at last labs   Flu: Lab Results  Component Value Date   HGBA1C 10.7 (H) 03/22/2018    Patient Active Problem List   Diagnosis Date Noted  . NASH  (nonalcoholic steatohepatitis) 07/26/2015  . BMI 39.0-39.9,adult 03/19/2012  . Hematoma 03/19/2012  . HTN (hypertension), benign 01/09/2012  . Osteopenia 01/09/2012  . Migraines 01/09/2012  . Insomnia 01/09/2012  . Anxiety and depression 01/09/2012  . Hyperlipidemia 01/09/2012  . Diabetes mellitus 01/09/2012  . Allergic rhinitis 01/09/2012    Past Medical History:  Diagnosis Date  . Anxiety   . 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 Depression   . Diabetes mellitus   . Diabetes mellitus, type II ([place])   . GERD (gastroesophageal reflux disease)   . Hematoma    on buttock  . Hyperlipidemia   . Hypertension   . Migraine   . Seasonal allergies     Past Surgical History:  Procedure Laterality Date  . ABDOMINAL HYSTERECTOMY  2004   PCOS  . EYE SURGERY    . INNER EAR SURGERY Left 08/2016    Social History   Tobacco Use  . Smoking status: Never Smoker  . Smokeless tobacco: Never Used  Substance Use Topics  . Alcohol use: Yes    Alcohol/week: 8.0 - 10.0 standard drinks    Types: 6 Cans of beer, 2 Shots of liquor per week    Comment: weekly  . Drug use: No  Family History  Problem Relation Age of Onset  . Cancer Mother 57       colon  . Diabetes Mother   . Rheum arthritis Mother   . Dementia Mother   . Depression Mother   . Cancer Father 5       bone and prostate  . Cancer Maternal Grandmother        breast  . Diabetes Maternal Grandfather   . Depression Cousin     Allergies  Allergen Reactions  . Buspar [Buspirone] Other (See Comments)    Lip swelling and rash numbness   . Augmentin [Amoxicillin-Pot Clavulanate] Nausea And Vomiting  . Biaxin [Clarithromycin]   . Ceftin [Cefuroxime] Swelling    Eye swelling, shortness of breath   . Ciprofloxacin     Developed itching with IV form in the hospital, but has since taken PO without a problem  . Doxycycline Itching  . Levaquin [Levofloxacin] Hives  . Metformin And Related Nausea And  Vomiting  . Pyridium [Phenazopyridine Hcl]     Does not remember reaction  . Sulfa Antibiotics     As a younger person had itching, but has taken more recently and did ok  . Latex Rash  . Lorabid [Loracarbef] Rash    Medication list has been reviewed and updated.  Current Outpatient Medications on File Prior to Visit  Medication Sig Dispense Refill  . azelastine (ASTELIN) 0.1 % nasal spray Place 2 sprays into both nostrils 2 (two) times daily. (Patient taking differently: Place 2 sprays into both nostrils as needed. ) 30 mL 6  . citalopram (CELEXA) 40 MG tablet Take 1 tablet (40 mg total) by mouth daily. 90 tablet 0  . clonazePAM (KLONOPIN) 0.5 MG tablet Take 1 tablet (0.5 mg total) by mouth 2 (two) times daily. 180 tablet 0  . dexlansoprazole (DEXILANT) 60 MG capsule Take 1 capsule (60 mg total) by mouth daily. 90 capsule 1  . Dulaglutide (TRULICITY) 1.5 SH/7.54YO SOPN Inject 1.5 mg per week for diabetes 12 pen 2  . fluticasone (FLONASE) 50 MCG/ACT nasal spray as needed.    . folic acid (FOLVITE) 1 MG tablet TAKE 1 TABLET BY MOUTH EVERY DAY 90 tablet 3  . glucose blood test strip Use to check glucose 1-2x daily 100 each 12  . lisinopril-hydrochlorothiazide (PRINZIDE,ZESTORETIC) 10-12.5 MG tablet TAKE 1/2 TABLET ONCE A DAY. MAY INCREASE TO WHOLE TABLET IF DIRECTED BY MD 90 tablet 3  . loratadine (CLARITIN) 10 MG tablet Take 1 tablet (10 mg total) by mouth daily. 90 tablet 3  . Multiple Vitamins-Minerals (MULTIVITAMIN WITH MINERALS) tablet Take 1 tablet by mouth daily.    . Omega-3 Fatty Acids (FISH OIL) 1000 MG CAPS Take by mouth.    . pioglitazone (ACTOS) 30 MG tablet TAKE 1 TABLET BY MOUTH EVERY DAY 90 tablet 1  . rosuvastatin (CRESTOR) 40 MG tablet TAKE 1 TABLET (40 MG TOTAL) BY MOUTH DAILY. 90 tablet 3   No current facility-administered medications on file prior to visit.     Review of Systems:  As per HPI- otherwise negative.   Physical Examination: There were no vitals filed  for this visit. There were no vitals filed for this visit. There is no height or weight on file to calculate BMI. Ideal Body Weight:    GEN: WDWN, NAD, Non-toxic, A & O x 3 HEENT: Atraumatic, Normocephalic. Neck supple. No masses, No LAD. Ears and Nose: No external deformity. CV: RRR, No M/G/R. No JVD. No thrill. No extra  heart sounds. PULM: CTA B, no wheezes, crackles, rhonchi. No retractions. No resp. distress. No accessory muscle use. ABD: S, NT, ND, +BS. No rebound. No HSM. EXTR: No c/c/e NEURO Normal gait.  PSYCH: Normally interactive. Conversant. Not depressed or anxious appearing.  Calm demeanor.    Assessment and Plan: ***  Signed Lamar Blinks, MD

## 2018-07-11 ENCOUNTER — Ambulatory Visit: Payer: BLUE CROSS/BLUE SHIELD | Admitting: Family Medicine

## 2018-07-14 NOTE — Progress Notes (Addendum)
[place] at [facility] [address], [place], [place] [address] [phone]  Date:  07/18/2018   Name:  Natalie Richard   DOB:  [birth year]   MRN:  [id number]  PCP:  Darreld Mclean, MD    Chief Complaint: Anorexia (fatugue, weakness, no energy) and 2 month follow up   History of Present Illness:  Natalie Richard is a 52 y.o. very pleasant female patient who presents with the following:  2 month follow-up visit today- history of DM, NASH, obesity, HTN Last seen here in July after she was in the ER with eye swelling:  Following up from an ER visit today- she was seen with questionable allergic reaction to Ceftin and eye swelling. This is now resolved, but she continues to feel poorly overall and to complain of fatigue and SOB She had an EKG and CXR in the ER already Will treat with a course of plain amox for likely sinus infection She has history of NASH and recently worsening LFTs - this may be why she is feeling poorly.  Will repeat her liver tests today and plan to refer to GI if needed Will also check BNP and D dimer due to her SOB complaint  We have increased her trulicity but pt still has high glucose.  Discussed adding insulin but she is not ready to do this.  Plan to repeat A1c in 2 months and see where we are She is intolerant to metformin and farxiga    Lab Results  Component Value Date   HGBA1C 10.7 (H) 03/22/2018   Flu shot is due A1c due  celexa Klonopin dexilant trulicity Prinzide actos crestor  She notes that her appetite has been less and so has lost some weight This has been the case for about 2 months now No medication change noted  S/p hysterectomy No abd pain but she does feel nauseated at times She may feel tired/ lightheaded- she has noted this for 2-3 months No CP or SOB Not vomiting   BP Readings from Last 3 Encounters:  07/18/18 126/80  04/25/18 108/80  04/19/18 121/86   She  notes that she has been feeling depressed- she is seeing her psychiatrist in 2 weeks and I encouraged her to discuss her concerns with him She will sometimes have crying spells She is already taking celexa and klonopin No SI, she feels that she will be ok to check in with psychiatry in 2 weeks for these concerns We will also do labs to look for any organic cause of her sx now   She will schedule her eye exam   Wt Readings from Last 3 Encounters:  07/18/18 234 lb (106.1 kg)  04/25/18 241 lb (109.3 kg)  04/19/18 245 lb (111.1 kg)    Patient Active Problem List   Diagnosis Date Noted  . NASH (nonalcoholic steatohepatitis) 07/26/2015  . BMI 39.0-39.9,adult 03/19/2012  . Hematoma 03/19/2012  . HTN (hypertension), benign 01/09/2012  . Osteopenia 01/09/2012  . Migraines 01/09/2012  . Insomnia 01/09/2012  . Anxiety and depression 01/09/2012  . Hyperlipidemia 01/09/2012  . Diabetes mellitus 01/09/2012  . Allergic rhinitis 01/09/2012    Past Medical History:  Diagnosis Date  . Anxiety   . 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 Depression   . Diabetes mellitus   . Diabetes mellitus, type II ([place])   . GERD (gastroesophageal reflux disease)   . Hematoma  on buttock  . Hyperlipidemia   . Hypertension   . Migraine   . Seasonal allergies     Past Surgical History:  Procedure Laterality Date  . ABDOMINAL HYSTERECTOMY  2004   PCOS  . EYE SURGERY    . INNER EAR SURGERY Left 08/2016    Social History   Tobacco Use  . Smoking status: Never Smoker  . Smokeless tobacco: Never Used  Substance Use Topics  . Alcohol use: Yes    Alcohol/week: 8.0 - 10.0 standard drinks    Types: 6 Cans of beer, 2 Shots of liquor per week    Comment: weekly  . Drug use: No    Family History  Problem Relation Age of Onset  . Cancer Mother 35       colon  . Diabetes Mother   . Rheum arthritis Mother   . Dementia Mother   . Depression Mother   . Cancer Father 67       bone  and prostate  . Cancer Maternal Grandmother        breast  . Diabetes Maternal Grandfather   . Depression Cousin     Allergies  Allergen Reactions  . Buspar [Buspirone] Other (See Comments)    Lip swelling and rash numbness   . Augmentin [Amoxicillin-Pot Clavulanate] Nausea And Vomiting  . Biaxin [Clarithromycin]   . Ceftin [Cefuroxime] Swelling    Eye swelling, shortness of breath   . Ciprofloxacin     Developed itching with IV form in the hospital, but has since taken PO without a problem  . Dapagliflozin Other (See Comments)    Burning upon urination. Increased blood sugar.  . Doxycycline Itching  . Levaquin [Levofloxacin] Hives  . Metformin And Related Nausea And Vomiting  . Phenazopyridine Hcl Other (See Comments)    Does not remember reaction  . Pyridium [Phenazopyridine Hcl]     Does not remember reaction  . Sulfa Antibiotics     As a younger person had itching, but has taken more recently and did ok  . Latex Rash  . Lorabid [Loracarbef] Rash    Medication list has been reviewed and updated.  Current Outpatient Medications on File Prior to Visit  Medication Sig Dispense Refill  . azelastine (ASTELIN) 0.1 % nasal spray Place 2 sprays into both nostrils 2 (two) times daily. (Patient taking differently: Place 2 sprays into both nostrils as needed. ) 30 mL 6  . citalopram (CELEXA) 40 MG tablet Take 1 tablet (40 mg total) by mouth daily. 90 tablet 0  . clonazePAM (KLONOPIN) 0.5 MG tablet Take 1 tablet (0.5 mg total) by mouth 2 (two) times daily. 180 tablet 0  . dexlansoprazole (DEXILANT) 60 MG capsule Take 1 capsule (60 mg total) by mouth daily. 90 capsule 1  . Dulaglutide (TRULICITY) 1.5 QI/2.9NL SOPN Inject 1.5 mg per week for diabetes 12 pen 2  . fluticasone (FLONASE) 50 MCG/ACT nasal spray as needed.    . folic acid (FOLVITE) 1 MG tablet TAKE 1 TABLET BY MOUTH EVERY DAY 90 tablet 3  . glucose blood test strip Use to check glucose 1-2x daily 100 each 12  .  lisinopril-hydrochlorothiazide (PRINZIDE,ZESTORETIC) 10-12.5 MG tablet TAKE 1/2 TABLET ONCE A DAY. MAY INCREASE TO WHOLE TABLET IF DIRECTED BY MD 90 tablet 3  . loratadine (CLARITIN) 10 MG tablet Take 1 tablet (10 mg total) by mouth daily. 90 tablet 3  . Multiple Vitamins-Minerals (MULTIVITAMIN WITH MINERALS) tablet Take 1 tablet by mouth daily.    Marland Kitchen  Omega-3 Fatty Acids (FISH OIL) 1000 MG CAPS Take by mouth.    . pioglitazone (ACTOS) 30 MG tablet TAKE 1 TABLET BY MOUTH EVERY DAY 90 tablet 1  . rosuvastatin (CRESTOR) 40 MG tablet TAKE 1 TABLET (40 MG TOTAL) BY MOUTH DAILY. 90 tablet 3   No current facility-administered medications on file prior to visit.     Review of Systems:  As per HPI- otherwise negative. She notes that she "just feel tired" She thinks that she is sleeping ok After lunch she may fall asleep for a couple of hours  She has not been tested for OSA as of yet  She is drinking 2-3 "white claw" drinks per day- this has been the case for several months She is not sure if this is a problem   Physical Examination: Vitals:   07/18/18 0919  BP: 126/80  Pulse: 88  Resp: 16  Temp: 98.5 F (36.9 C)  SpO2: 95%   Vitals:   07/18/18 0919  Weight: 234 lb (106.1 kg)  Height: 5' 6"  (1.676 m)   Body mass index is 37.77 kg/m. Ideal Body Weight: Weight in (lb) to have BMI = 25: 154.6  GEN: WDWN, NAD, Non-toxic, A & O x 3, obese, otherwise looks well  HEENT: Atraumatic, Normocephalic. Neck supple. No masses, No LAD. Marland Kitchenne Ears and Nose: No external deformity. CV: RRR, No M/G/R. No JVD. No thrill. No extra heart sounds. PULM: CTA B, no wheezes, crackles, rhonchi. No retractions. No resp. distress. No accessory muscle use. ABD: S, NT, ND, +BS. No rebound. No HSM. EXTR: No c/c/e NEURO Normal gait.  PSYCH: Normally interactive. Conversant. Not depressed or anxious appearing.  Calm demeanor.    Assessment and Plan: Transaminitis  Type 2 diabetes mellitus with hyperglycemia,  without long-term current use of insulin (HCC) - Plan: Hemoglobin A1c  HTN (hypertension), benign - Plan: CBC, Comprehensive metabolic panel  Fatigue, unspecified type - Plan: TSH  Alcohol abuse  Anxiety and depression  Following up today Will obtain labs for her She has been noted to have transaminitis as of late and now admits to 2-3 alcoholic beverages per day.  Will repeat LFTS today Check A1c BP is under ok control  Depression- continue current meds She is going to see her psychiatrist shortly Encouraged her to cut back with goal of abstaining from alcohol Also encouraged exercise If labs are ok continue testing for OSA   Signed Lamar Blinks, MD  Received her labs- called her 10/21 to discuss  No answer, LMOM  Need to ask- is she seeing GI about her LFTs?   Called and was able to reach her on 10/24 She is on max truclicity and also actos She does not tolerate metformin or SGLT2 Not near goal Will start on insulin as her A1c is quite high. She is ok with this plan Detailed discussion about how to start and titrate insulin Start with 10u.  As long as FBG is >150 increase by 2u every 2 days until she reaches 20 u, then hold until we repeat A1c See me in 3 months for A1c  Offered to have her in to go over pen and do teaching in person. She is already using trulicity and thinks that she will do ok, but will come in if she does not understand the pen    Results for orders placed or performed in visit on 07/18/18  Hemoglobin A1c  Result Value Ref Range   Hgb A1c MFr Bld 11.1 (H) 4.6 -  6.5 %  TSH  Result Value Ref Range   TSH 1.63 0.35 - 4.50 uIU/mL  CBC  Result Value Ref Range   WBC 6.5 4.0 - 10.5 K/uL   RBC 4.89 3.87 - 5.11 Mil/uL   Platelets 281.0 150.0 - 400.0 K/uL   Hemoglobin 14.0 12.0 - 15.0 g/dL   HCT 42.0 36.0 - 46.0 %   MCV 85.9 78.0 - 100.0 fl   MCHC 33.4 30.0 - 36.0 g/dL   RDW 15.7 (H) 11.5 - 15.5 %  Comprehensive metabolic panel  Result Value  Ref Range   Sodium 137 135 - 145 mEq/L   Potassium 4.2 3.5 - 5.1 mEq/L   Chloride 98 96 - 112 mEq/L   CO2 27 19 - 32 mEq/L   Glucose, Bld 264 (H) 70 - 99 mg/dL   BUN 12 6 - 23 mg/dL   Creatinine, Ser 0.82 0.40 - 1.20 mg/dL   Total Bilirubin 0.5 0.2 - 1.2 mg/dL   Alkaline Phosphatase 135 (H) 39 - 117 U/L   AST 138 (H) 0 - 37 U/L   ALT 93 (H) 0 - 35 U/L   Total Protein 6.9 6.0 - 8.3 g/dL   Albumin 4.0 3.5 - 5.2 g/dL   Calcium 9.5 8.4 - 10.5 mg/dL   GFR 77.20 >60.00 mL/min

## 2018-07-18 ENCOUNTER — Encounter: Payer: Self-pay | Admitting: Family Medicine

## 2018-07-18 ENCOUNTER — Ambulatory Visit: Payer: BLUE CROSS/BLUE SHIELD | Admitting: Family Medicine

## 2018-07-18 VITALS — BP 126/80 | HR 88 | Temp 98.5°F | Resp 16 | Ht 66.0 in | Wt 234.0 lb

## 2018-07-18 DIAGNOSIS — E1165 Type 2 diabetes mellitus with hyperglycemia: Secondary | ICD-10-CM

## 2018-07-18 DIAGNOSIS — R740 Nonspecific elevation of levels of transaminase and lactic acid dehydrogenase [LDH]: Secondary | ICD-10-CM

## 2018-07-18 DIAGNOSIS — I10 Essential (primary) hypertension: Secondary | ICD-10-CM | POA: Diagnosis not present

## 2018-07-18 DIAGNOSIS — F32A Depression, unspecified: Secondary | ICD-10-CM

## 2018-07-18 DIAGNOSIS — R7401 Elevation of levels of liver transaminase levels: Secondary | ICD-10-CM

## 2018-07-18 DIAGNOSIS — F329 Major depressive disorder, single episode, unspecified: Secondary | ICD-10-CM

## 2018-07-18 DIAGNOSIS — F1010 Alcohol abuse, uncomplicated: Secondary | ICD-10-CM

## 2018-07-18 DIAGNOSIS — R5383 Other fatigue: Secondary | ICD-10-CM

## 2018-07-18 DIAGNOSIS — F419 Anxiety disorder, unspecified: Secondary | ICD-10-CM

## 2018-07-18 LAB — CBC
HEMATOCRIT: 42 % (ref 36.0–46.0)
Hemoglobin: 14 g/dL (ref 12.0–15.0)
MCHC: 33.4 g/dL (ref 30.0–36.0)
MCV: 85.9 fl (ref 78.0–100.0)
Platelets: 281 10*3/uL (ref 150.0–400.0)
RBC: 4.89 Mil/uL (ref 3.87–5.11)
RDW: 15.7 % — ABNORMAL HIGH (ref 11.5–15.5)
WBC: 6.5 10*3/uL (ref 4.0–10.5)

## 2018-07-18 LAB — COMPREHENSIVE METABOLIC PANEL
ALBUMIN: 4 g/dL (ref 3.5–5.2)
ALT: 93 U/L — ABNORMAL HIGH (ref 0–35)
AST: 138 U/L — ABNORMAL HIGH (ref 0–37)
Alkaline Phosphatase: 135 U/L — ABNORMAL HIGH (ref 39–117)
BUN: 12 mg/dL (ref 6–23)
CHLORIDE: 98 meq/L (ref 96–112)
CO2: 27 meq/L (ref 19–32)
Calcium: 9.5 mg/dL (ref 8.4–10.5)
Creatinine, Ser: 0.82 mg/dL (ref 0.40–1.20)
GFR: 77.2 mL/min (ref >60.00)
Glucose, Bld: 264 mg/dL — ABNORMAL HIGH (ref 70–99)
POTASSIUM: 4.2 meq/L (ref 3.5–5.1)
Sodium: 137 mEq/L (ref 135–145)
Total Bilirubin: 0.5 mg/dL (ref 0.2–1.2)
Total Protein: 6.9 g/dL (ref 6.0–8.3)

## 2018-07-18 LAB — TSH: TSH: 1.63 u[IU]/mL (ref 0.35–4.50)

## 2018-07-18 LAB — HEMOGLOBIN A1C: Hgb A1c MFr Bld: 11.1 % — ABNORMAL HIGH (ref 4.6–6.5)

## 2018-07-18 NOTE — Patient Instructions (Signed)
Good to see you today- I will be in touch with your labs asap I do think that cutting down or stopping alcohol will help your mood. Exercise is also known to improve mood/ depression Please discuss your situation with your psychiatrist at your upcoming visit Otherwise let's see how your labs look, and consider testing for OSA if your labs all look ok

## 2018-07-19 ENCOUNTER — Encounter: Payer: Self-pay | Admitting: Family Medicine

## 2018-07-25 ENCOUNTER — Telehealth: Payer: Self-pay | Admitting: Family Medicine

## 2018-07-25 NOTE — Telephone Encounter (Signed)
Copied from [place] [phone]. Topic: Quick Communication - See Telephone Encounter >> Jul 25, 2018 12:24 PM Bea Graff, NT wrote: CRM for notification. See Telephone encounter for: 07/25/18. Pt would like a call with her 10/16 lab results.

## 2018-07-25 NOTE — Telephone Encounter (Signed)
Called her - did not reach but LMOM  Her A1c has gone up, we need to make a med change Will continue to try her

## 2018-07-25 NOTE — Telephone Encounter (Signed)
No letter has been sent to patient regarding results please advise on results and I will call patient with her results.

## 2018-07-26 MED ORDER — PEN NEEDLES 32G X 5 MM MISC: 1.0000 | Freq: Every day | 3 refills | Status: DC

## 2018-07-26 MED ORDER — BASAGLAR KWIKPEN 100 UNIT/ML ~~LOC~~ SOPN: PEN_INJECTOR | SUBCUTANEOUS | 6 refills | Status: DC

## 2018-07-26 NOTE — Addendum Note (Signed)
Addended by: Lamar Blinks C on: 07/26/2018 10:36 AM   Modules accepted: Orders

## 2018-07-27 ENCOUNTER — Ambulatory Visit: Payer: Self-pay | Admitting: *Deleted

## 2018-07-27 NOTE — Telephone Encounter (Signed)
Pt has questions about Insulin Glargine (BASAGLAR KWIKPEN) 100 UNIT/ML SOPN before she start this medication

## 2018-07-27 NOTE — Telephone Encounter (Signed)
  Reason for Disposition . Caller has NON-URGENT medication question about med that PCP prescribed and triager unable to answer question  Answer Assessment - Initial Assessment Questions 1. SYMPTOMS: "Do you have any symptoms?"     Patient wants to know when to use the pen. Her instructions say daily- but she wants to know when during the day.  Patient has questions about the reaction with alcohol. Patient states there is an alcohol warning that concerns her.  Told patient I would send her questions to her provider for response.  Protocols used: MEDICATION QUESTION CALL-A-AH

## 2018-07-31 ENCOUNTER — Telehealth: Payer: Self-pay | Admitting: Family Medicine

## 2018-07-31 NOTE — Telephone Encounter (Signed)
Called patient she states she already got them answered and hung up on me.

## 2018-07-31 NOTE — Telephone Encounter (Signed)
Please advise her to take just the 14 units as she would otherwise.  No need to take any extra.  Thank you!

## 2018-07-31 NOTE — Telephone Encounter (Signed)
Thanks Gilmore Laroche

## 2018-07-31 NOTE — Telephone Encounter (Signed)
Received call from [facility] stating pt called back and reports previous message had some incorrect information and did not ask the right question. Pt states she took 10 units basaglar on Sat and Sun. She was supposed to titrate to 12 units Monday night and tonight. Pt forgot to take her dose last night. Called pharmacy this morning and they told her to take the 12 units this morning and pt did. Pt now wants to know if she should take 12 units tonight since her blood sugar is still elevated. Advised pt per covering Provider Charlett Blake) to check blood sugar a couple more times this evening and as long as blood sugar is greater than 100 she can take 5 units tonight. If glucose less than 100, DO NOT give dose tonight and pt voices understanding. Wednesday evening take 10 units. Thursday evening she may titrate to 12 units and continue titration as previously directed by PCP.  Pt then states her insulin needle has bent on the last 2 injections she has given. Pt states the needle is straight before administration and she is not injecting it through her clothing. Advised pt to bring needles to the office tomorrow and see me to review her current injection technique and examine needles. Pt voices understanding and will come to the office at 3pm tomorrow.

## 2018-07-31 NOTE — Telephone Encounter (Signed)
Patient called and she says she missed her dose of Basaglar insulin 12 units last night and wants to know what to do. She says her instructions are to increase her insulin by 2 units every night until her fasting blood sugar is 150. Her blood sugar this morning 261. She took 10 units Saturday and Sunday night; blood sugar Sunday 269 and Monday 239. Tonight she will be taking 14 units and tomorrow 14 units. Her question is does she need to take any additional or just take the 14 units tonight as scheduled and go from there. She has no symptoms. I advised someone from the office will call with the provider's recommendation who is covering for Dr. Lorelei Pont, she verbalized understanding.

## 2018-08-01 ENCOUNTER — Other Ambulatory Visit: Payer: Self-pay

## 2018-08-05 ENCOUNTER — Other Ambulatory Visit: Payer: Self-pay | Admitting: Family Medicine

## 2018-08-05 DIAGNOSIS — K219 Gastro-esophageal reflux disease without esophagitis: Secondary | ICD-10-CM

## 2018-08-14 LAB — HM DIABETES EYE EXAM

## 2018-08-16 ENCOUNTER — Other Ambulatory Visit: Payer: Self-pay | Admitting: Family Medicine

## 2018-08-16 DIAGNOSIS — E785 Hyperlipidemia, unspecified: Secondary | ICD-10-CM

## 2018-08-16 NOTE — Telephone Encounter (Signed)
Patient calling to check status of this medication. Patient states she is completely out. Advised patient of turn around time. Please advise.

## 2018-09-11 ENCOUNTER — Other Ambulatory Visit: Payer: Self-pay

## 2018-09-11 DIAGNOSIS — E1165 Type 2 diabetes mellitus with hyperglycemia: Secondary | ICD-10-CM

## 2018-09-11 MED ORDER — BASAGLAR KWIKPEN 100 UNIT/ML ~~LOC~~ SOPN: PEN_INJECTOR | SUBCUTANEOUS | 3 refills | Status: DC

## 2018-10-13 NOTE — Progress Notes (Deleted)
Natalie Richard at [facility] [address] [place], [place] [address] [phone]  Date:  10/18/2018   Name:  Natalie Richard   DOB:  [birth year]   MRN:  [id number]  PCP:  Darreld Mclean, MD    Chief Complaint: No chief complaint on file.   History of Present Illness:  Natalie Richard is a 53 y.o. very pleasant female patient who presents with the following:  Short-term follow-up today.  History of obesity, diabetes, hyperlipidemia, hypertension, anxiety and depression, NASH At her last visit in October, Natalie Richard was not doing very well.  She is having more symptoms of anxiety and depression, mostly see her psychiatrist soon.  She had been drinking most days, which is not good for her liver.  She was on max doses of Trulicity and Actos, and her A1c was too high Lab Results  Component Value Date   HGBA1C 11.1 (H) 07/18/2018   We started her on long-acting insulin, and I asked her to come back in 3 months for recheck  Patient Active Problem List   Diagnosis Date Noted  . NASH (nonalcoholic steatohepatitis) 07/26/2015  . BMI 39.0-39.9,adult 03/19/2012  . Hematoma 03/19/2012  . HTN (hypertension), benign 01/09/2012  . Osteopenia 01/09/2012  . Migraines 01/09/2012  . Insomnia 01/09/2012  . Anxiety and depression 01/09/2012  . Hyperlipidemia 01/09/2012  . Diabetes mellitus 01/09/2012  . Allergic rhinitis 01/09/2012    Past Medical History:  Diagnosis Date  . Anxiety   . 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 Depression   . Diabetes mellitus   . Diabetes mellitus, type II ([place])   . GERD (gastroesophageal reflux disease)   . Hematoma    on buttock  . Hyperlipidemia   . Hypertension   . Migraine   . Seasonal allergies     Past Surgical History:  Procedure Laterality Date  . ABDOMINAL HYSTERECTOMY  2004   PCOS  . EYE SURGERY    . INNER EAR SURGERY Left 08/2016    Social History   Tobacco Use  .  Smoking status: Never Smoker  . Smokeless tobacco: Never Used  Substance Use Topics  . Alcohol use: Yes    Alcohol/week: 8.0 - 10.0 standard drinks    Types: 6 Cans of beer, 2 Shots of liquor per week    Comment: weekly  . Drug use: No    Family History  Problem Relation Age of Onset  . Cancer Mother 100       colon  . Diabetes Mother   . Rheum arthritis Mother   . Dementia Mother   . Depression Mother   . Cancer Father 82       bone and prostate  . Cancer Maternal Grandmother        breast  . Diabetes Maternal Grandfather   . Depression Cousin     Allergies  Allergen Reactions  . Buspar [Buspirone] Other (See Comments)    Lip swelling and rash numbness   . Augmentin [Amoxicillin-Pot Clavulanate] Nausea And Vomiting  . Biaxin [Clarithromycin]   . Ceftin [Cefuroxime] Swelling    Eye swelling, shortness of breath   . Ciprofloxacin     Developed itching with IV form in the hospital, but has since taken PO without a problem  . Dapagliflozin Other (See Comments)    Burning upon urination. Increased blood sugar.  . Doxycycline Itching  . Levaquin [Levofloxacin] Hives  .  Metformin And Related Nausea And Vomiting  . Phenazopyridine Hcl Other (See Comments)    Does not remember reaction  . Pyridium [Phenazopyridine Hcl]     Does not remember reaction  . Sulfa Antibiotics     As a younger person had itching, but has taken more recently and did ok  . Latex Rash  . Lorabid [Loracarbef] Rash    Medication list has been reviewed and updated.  Current Outpatient Medications on File Prior to Visit  Medication Sig Dispense Refill  . azelastine (ASTELIN) 0.1 % nasal spray Place 2 sprays into both nostrils 2 (two) times daily. (Patient taking differently: Place 2 sprays into both nostrils as needed. ) 30 mL 6  . citalopram (CELEXA) 40 MG tablet Take 1 tablet (40 mg total) by mouth daily. 90 tablet 0  . clonazePAM (KLONOPIN) 0.5 MG tablet Take 1 tablet (0.5 mg total) by mouth 2  (two) times daily. 180 tablet 0  . DEXILANT 60 MG capsule TAKE 1 CAPSULE BY MOUTH EVERY DAY 90 capsule 1  . Dulaglutide (TRULICITY) 1.5 LM/7.8ML SOPN Inject 1.5 mg per week for diabetes 12 pen 2  . fluticasone (FLONASE) 50 MCG/ACT nasal spray as needed.    . folic acid (FOLVITE) 1 MG tablet TAKE 1 TABLET BY MOUTH EVERY DAY 90 tablet 3  . glucose blood test strip Use to check glucose 1-2x daily 100 each 12  . Insulin Glargine (BASAGLAR KWIKPEN) 100 UNIT/ML SOPN Start with 10 units daily, increase as directed by MD.  Estimate final dose will be 25u daily 45 mL 3  . Insulin Pen Needle (PEN NEEDLES) 32G X 5 MM MISC 1 each by Does not apply route daily. 100 each 3  . lisinopril-hydrochlorothiazide (PRINZIDE,ZESTORETIC) 10-12.5 MG tablet TAKE 1/2 TABLET ONCE A DAY. MAY INCREASE TO WHOLE TABLET IF DIRECTED BY MD 90 tablet 3  . loratadine (CLARITIN) 10 MG tablet Take 1 tablet (10 mg total) by mouth daily. 90 tablet 3  . Multiple Vitamins-Minerals (MULTIVITAMIN WITH MINERALS) tablet Take 1 tablet by mouth daily.    . Omega-3 Fatty Acids (FISH OIL) 1000 MG CAPS Take by mouth.    . pioglitazone (ACTOS) 30 MG tablet TAKE 1 TABLET BY MOUTH EVERY DAY 90 tablet 1  . rosuvastatin (CRESTOR) 40 MG tablet TAKE 1 TABLET BY MOUTH EVERY DAY 90 tablet 0   No current facility-administered medications on file prior to visit.     Review of Systems:  As per HPI- otherwise negative.   Physical Examination: There were no vitals filed for this visit. There were no vitals filed for this visit. There is no height or weight on file to calculate BMI. Ideal Body Weight:    GEN: WDWN, NAD, Non-toxic, A & O x 3 HEENT: Atraumatic, Normocephalic. Neck supple. No masses, No LAD. Ears and Nose: No external deformity. CV: RRR, No M/G/R. No JVD. No thrill. No extra heart sounds. PULM: CTA B, no wheezes, crackles, rhonchi. No retractions. No resp. distress. No accessory muscle use. ABD: S, NT, ND, +BS. No rebound. No  HSM. EXTR: No c/c/e NEURO Normal gait.  PSYCH: Normally interactive. Conversant. Not depressed or anxious appearing.  Calm demeanor.    Assessment and Plan: ***  Signed Lamar Blinks, MD

## 2018-10-18 ENCOUNTER — Ambulatory Visit: Payer: Self-pay | Admitting: Family Medicine

## 2018-10-20 NOTE — Progress Notes (Deleted)
Natalie Richard at [facility] [address], [place], [place] [address] [phone]  Date:  10/22/2018   Name:  Natalie Richard   DOB:  03/25/1966   MRN:  [id number]  PCP:  Darreld Mclean, MD    Chief Complaint: No chief complaint on file.   History of Present Illness:  [name] is a 53 y.o. very pleasant female patient who presents with the following:  Here today for periodic follow-up visit. History of Natalie Richard, obesity, hypertension, depression /anxiety, hyperlipidemia, diabetes At that time she was drinking alcohol daily, and I encouraged her to stop due to her liver concerns Lab Results  Component Value Date   HGBA1C 11.1 (H) 07/18/2018   At her last visit her A1c was elevated as above, we started her on insulin-Basaglar KwikPen.  I had her start 10 units and titrate up to 20 She is also on max doses of Trulicity, and Actos At our last visit her LFTs were about her baseline  Ask about flu shot Shingrix?  Patient Active Problem List   Diagnosis Date Noted  . NASH (nonalcoholic steatohepatitis) 07/26/2015  . BMI 39.0-39.9,adult 03/19/2012  . Hematoma 03/19/2012  . HTN (hypertension), benign 01/09/2012  . Osteopenia 01/09/2012  . Migraines 01/09/2012  . Insomnia 01/09/2012  . Anxiety and depression 01/09/2012  . Hyperlipidemia 01/09/2012  . Diabetes mellitus 01/09/2012  . Allergic rhinitis 01/09/2012    Past Medical History:  Diagnosis Date  . Anxiety   . 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 Depression   . Diabetes mellitus   . Diabetes mellitus, type II ([place])   . GERD (gastroesophageal reflux disease)   . Hematoma    on buttock  . Hyperlipidemia   . Hypertension   . Migraine   . Seasonal allergies     Past Surgical History:  Procedure Laterality Date  . ABDOMINAL HYSTERECTOMY  2004   PCOS  . EYE SURGERY    . INNER EAR SURGERY Left 08/2016    Social History   Tobacco Use   . Smoking status: Never Smoker  . Smokeless tobacco: Never Used  Substance Use Topics  . Alcohol use: Yes    Alcohol/week: 8.0 - 10.0 standard drinks    Types: 6 Cans of beer, 2 Shots of liquor per week    Comment: weekly  . Drug use: No    Family History  Problem Relation Age of Onset  . Cancer Mother 64       colon  . Diabetes Mother   . Rheum arthritis Mother   . Dementia Mother   . Depression Mother   . Cancer Father 13       bone and prostate  . Cancer Maternal Grandmother        breast  . Diabetes Maternal Grandfather   . Depression Cousin     Allergies  Allergen Reactions  . Buspar [Buspirone] Other (See Comments)    Lip swelling and rash numbness   . Augmentin [Amoxicillin-Pot Clavulanate] Nausea And Vomiting  . Biaxin [Clarithromycin]   . Ceftin [Cefuroxime] Swelling    Eye swelling, shortness of breath   . Ciprofloxacin     Developed itching with IV form in the hospital, but has since taken PO without a problem  . Dapagliflozin Other (See Comments)    Burning upon urination. Increased blood sugar.  . Doxycycline Itching  . Levaquin [Levofloxacin] Hives  .  Metformin And Related Nausea And Vomiting  . Phenazopyridine Hcl Other (See Comments)    Does not remember reaction  . Pyridium [Phenazopyridine Hcl]     Does not remember reaction  . Sulfa Antibiotics     As a younger person had itching, but has taken more recently and did ok  . Latex Rash  . Lorabid [Loracarbef] Rash    Medication list has been reviewed and updated.  Current Outpatient Medications on File Prior to Visit  Medication Sig Dispense Refill  . azelastine (ASTELIN) 0.1 % nasal spray Place 2 sprays into both nostrils 2 (two) times daily. (Patient taking differently: Place 2 sprays into both nostrils as needed. ) 30 mL 6  . citalopram (CELEXA) 40 MG tablet Take 1 tablet (40 mg total) by mouth daily. 90 tablet 0  . clonazePAM (KLONOPIN) 0.5 MG tablet Take 1 tablet (0.5 mg total) by  mouth 2 (two) times daily. 180 tablet 0  . DEXILANT 60 MG capsule TAKE 1 CAPSULE BY MOUTH EVERY DAY 90 capsule 1  . Dulaglutide (TRULICITY) 1.5 PJ/0.9TO SOPN Inject 1.5 mg per week for diabetes 12 pen 2  . fluticasone (FLONASE) 50 MCG/ACT nasal spray as needed.    . folic acid (FOLVITE) 1 MG tablet TAKE 1 TABLET BY MOUTH EVERY DAY 90 tablet 3  . glucose blood test strip Use to check glucose 1-2x daily 100 each 12  . Insulin Glargine (BASAGLAR KWIKPEN) 100 UNIT/ML SOPN Start with 10 units daily, increase as directed by MD.  Estimate final dose will be 25u daily 45 mL 3  . Insulin Pen Needle (PEN NEEDLES) 32G X 5 MM MISC 1 each by Does not apply route daily. 100 each 3  . lisinopril-hydrochlorothiazide (PRINZIDE,ZESTORETIC) 10-12.5 MG tablet TAKE 1/2 TABLET ONCE A DAY. MAY INCREASE TO WHOLE TABLET IF DIRECTED BY MD 90 tablet 3  . loratadine (CLARITIN) 10 MG tablet Take 1 tablet (10 mg total) by mouth daily. 90 tablet 3  . Multiple Vitamins-Minerals (MULTIVITAMIN WITH MINERALS) tablet Take 1 tablet by mouth daily.    . Omega-3 Fatty Acids (FISH OIL) 1000 MG CAPS Take by mouth.    . pioglitazone (ACTOS) 30 MG tablet TAKE 1 TABLET BY MOUTH EVERY DAY 90 tablet 1  . rosuvastatin (CRESTOR) 40 MG tablet TAKE 1 TABLET BY MOUTH EVERY DAY 90 tablet 0   No current facility-administered medications on file prior to visit.     Review of Systems:  As per HPI- otherwise negative.   Physical Examination: There were no vitals filed for this visit. There were no vitals filed for this visit. There is no height or weight on file to calculate BMI. Ideal Body Weight:    GEN: WDWN, NAD, Non-toxic, A & O x 3 HEENT: Atraumatic, Normocephalic. Neck supple. No masses, No LAD. Ears and Nose: No external deformity. CV: RRR, No M/G/R. No JVD. No thrill. No extra heart sounds. PULM: CTA B, no wheezes, crackles, rhonchi. No retractions. No resp. distress. No accessory muscle use. ABD: S, NT, ND, +BS. No rebound. No  HSM. EXTR: No c/c/e NEURO Normal gait.  PSYCH: Normally interactive. Conversant. Not depressed or anxious appearing.  Calm demeanor.    Assessment and Plan: ***  Signed Lamar Blinks, MD

## 2018-10-22 ENCOUNTER — Ambulatory Visit: Payer: Self-pay | Admitting: Family Medicine

## 2018-10-22 IMAGING — CR DG CHEST 2V
2 series · 2 of 2 positions shown · non-contrast
Comparison: 12/17/2014

CLINICAL DATA: Shortness of breath.

EXAM:
CHEST - 2 VIEW

[w chest pa]
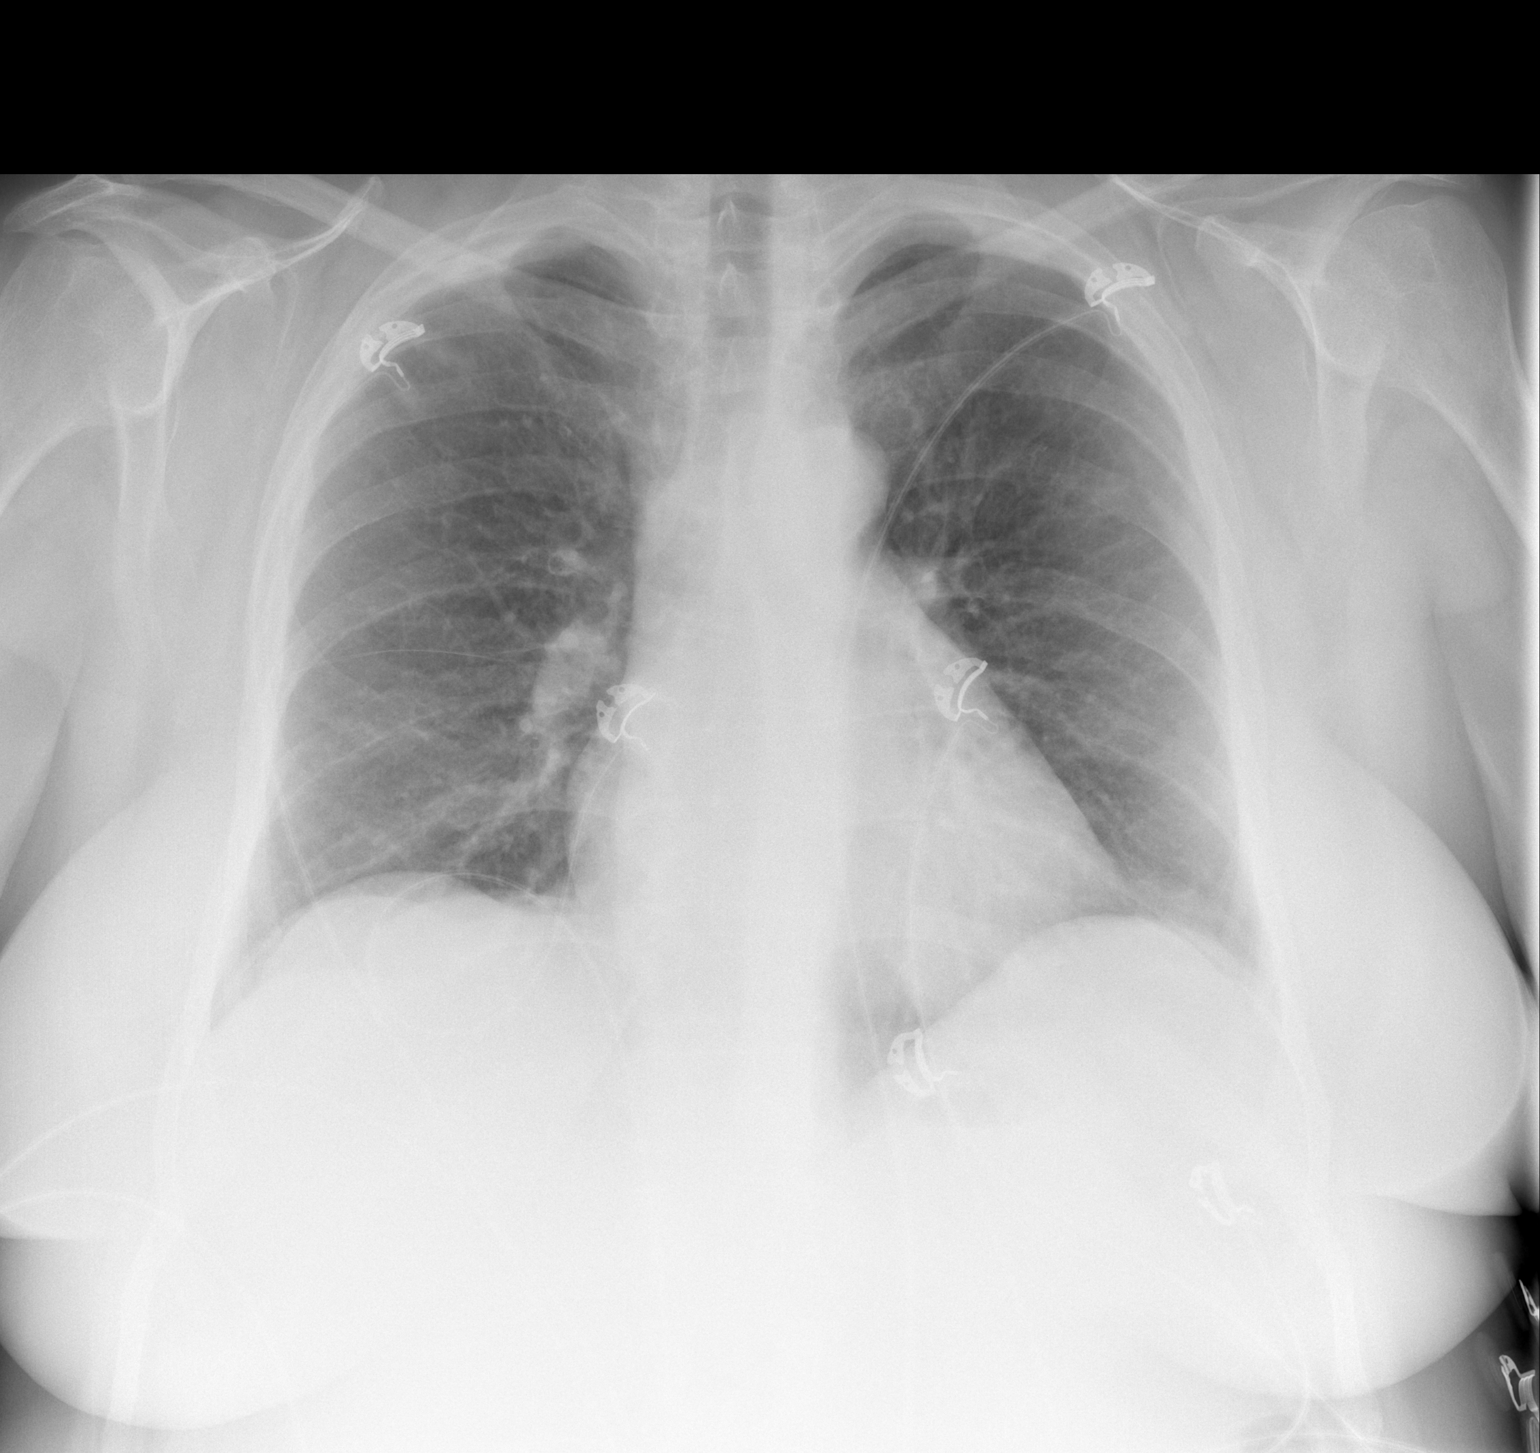

[w chest lat]
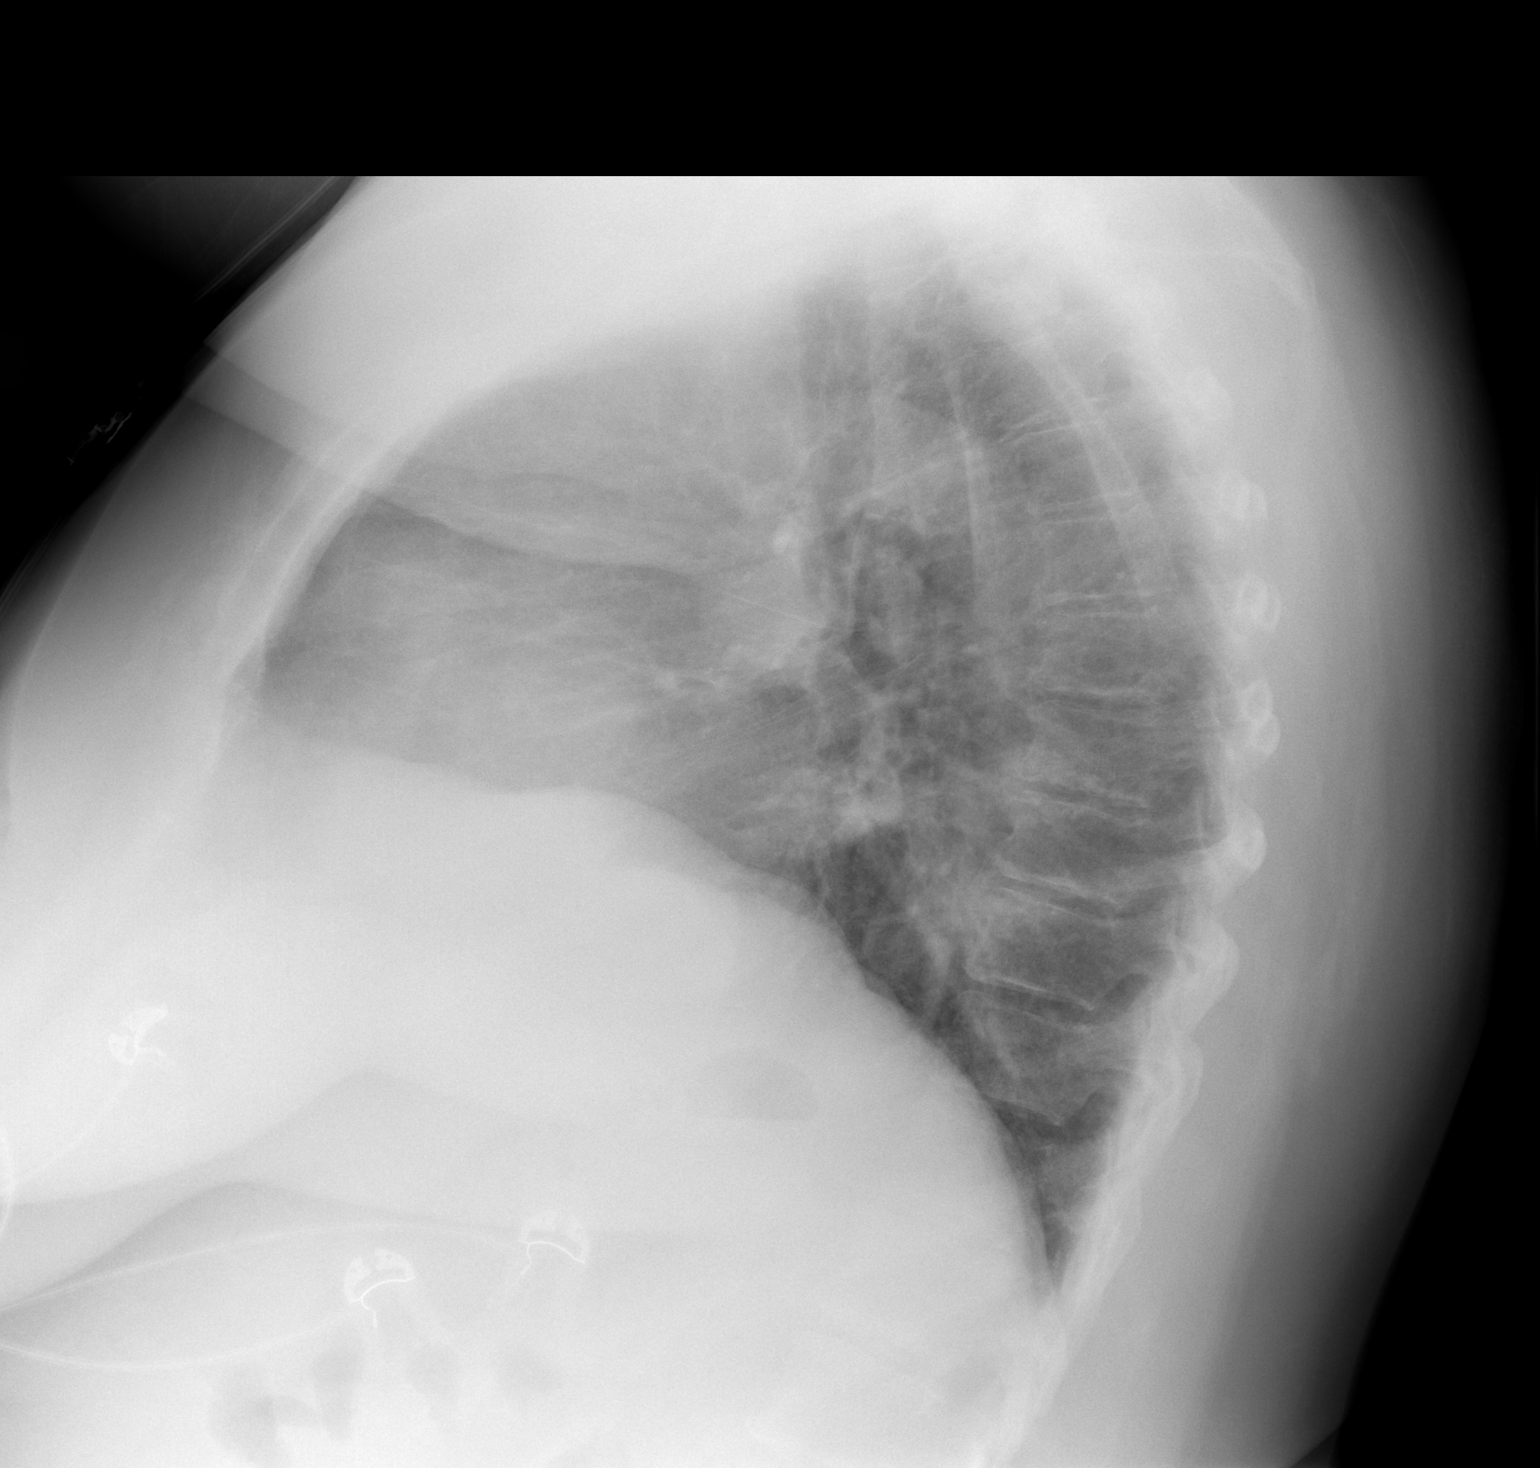

[2 of 2 positions shown; findings below may reference images not displayed]

FINDINGS: Lungs are clear without focal airspace disease or pulmonary edema.
Heart and mediastinum are within normal limits. Trachea is midline.
No large effusions. No suspicious bone findings.
IMPRESSION: No active cardiopulmonary disease.

## 2018-10-23 NOTE — Progress Notes (Addendum)
[place] at [facility] [address], [place], [place] [address] [phone]  Date:  10/24/2018   Name:  Natalie Richard   DOB:  1965/10/17   MRN:  [id number]  PCP:  Darreld Mclean, MD    Chief Complaint: No chief complaint on file.   History of Present Illness:  Natalie Richard is a 53 y.o. very pleasant female patient who presents with the following:  [name] is here for a periodic follow-up visit.  She has history of obesity, Karlene Lineman, hypertension, hyperlipidemia, diabetes At her last visit in October she admitted to drinking alcohol most days, and I encouraged her to quit due to her liver disease.  Her diabetes was also very out-of-control at that time, as below We had her start on long-acting insulin due to her very high A1c We will need to repeat A1c today, hopefully will be improved  Lab Results  Component Value Date   HGBA1C 11.1 (H) 07/18/2018   Liver ultrasound May, 2019 IMPRESSION: 1. 4 mm nonmobile echogenic focus adherent to the posterior gallbladder wall, stable in appearance from previous. Given the persistence of this finding, this most likely reflects a small polyp. No evidence for interval growth or other concerning features. Given size, this is almost certainly benign, with no follow-up imaging recommended. 2. No sonographic evidence for acute cholecystitis or biliary dilatation. 3. Diffusely increased hepatic echogenicity, suggesting steatosis.  She has been a patient of Dr. Collene Mares for GI care.  She had a colonoscopy in February 2019  ?  Is Dr. Collene Mares also managing her liver issues- per pt yes.  Celexa 40 daily Klonopin 0.5 as needed Dexilant 60 daily Trulicity weekly Basiglar 20 units daily  Lisinopril/HCTZ 10/12.5 mg- she is taking a 1/2 tablet Actos 30 Crestor 40 Abilify 5 mg  Her fasting sugar in the 180s, sometimes a bit lower She is fasting this am  Wt Readings from Last 3 Encounters:   10/24/18 227 lb (103 kg)  07/18/18 234 lb (106.1 kg)  04/25/18 241 lb (109.3 kg)   No low glucose noted She has lost a few lbs since our last visit  About 25 lbs down today She is trying to watch her diet She has decreased her alcohol intake; maybe she might have a couple of beers  She got a part time job with a [employer]; 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  Upon questioning, she still says her depression symptoms are a 6 out of 10, but that they are much better.  Patient Active Problem List   Diagnosis Date Noted  . NASH (nonalcoholic steatohepatitis) 07/26/2015  . BMI 39.0-39.9,adult 03/19/2012  . Hematoma 03/19/2012  . HTN (hypertension), benign 01/09/2012  . Osteopenia 01/09/2012  . Migraines 01/09/2012  . Insomnia 01/09/2012  . Anxiety and depression 01/09/2012  . Hyperlipidemia 01/09/2012  . Diabetes mellitus 01/09/2012  . Allergic rhinitis 01/09/2012    Past Medical History:  Diagnosis Date  . Anxiety   . 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 Depression   . Diabetes mellitus   . Diabetes mellitus, type II ([place])   . GERD (gastroesophageal reflux disease)   . Hematoma    on buttock  . Hyperlipidemia   . Hypertension   . Migraine   . Seasonal allergies     Past  Surgical History:  Procedure Laterality Date  . ABDOMINAL HYSTERECTOMY  2004   PCOS  . EYE SURGERY    . INNER EAR SURGERY Left 08/2016    Social History   Tobacco Use  . Smoking status: Never Smoker  . Smokeless tobacco: Never Used  Substance Use Topics  . Alcohol use: Yes    Alcohol/week: 8.0 - 10.0 standard drinks    Types: 6 Cans of beer, 2 Shots of liquor per week    Comment: weekly  . Drug use: No    Family History  Problem Relation Age of Onset  . Cancer Mother 39       colon  . Diabetes Mother   . Rheum arthritis Mother   . Dementia Mother   . Depression Mother   . Cancer  Father 31       bone and prostate  . Cancer Maternal Grandmother        breast  . Diabetes Maternal Grandfather   . Depression Cousin     Allergies  Allergen Reactions  . Buspar [Buspirone] Other (See Comments)    Lip swelling and rash numbness   . Augmentin [Amoxicillin-Pot Clavulanate] Nausea And Vomiting  . Biaxin [Clarithromycin]   . Ceftin [Cefuroxime] Swelling    Eye swelling, shortness of breath   . Ciprofloxacin     Developed itching with IV form in the hospital, but has since taken PO without a problem  . Dapagliflozin Other (See Comments)    Burning upon urination. Increased blood sugar.  . Doxycycline Itching  . Levaquin [Levofloxacin] Hives  . Metformin And Related Nausea And Vomiting  . Phenazopyridine Hcl Other (See Comments)    Does not remember reaction  . Pyridium [Phenazopyridine Hcl]     Does not remember reaction  . Sulfa Antibiotics     As a younger person had itching, but has taken more recently and did ok  . Latex Rash  . Lorabid [Loracarbef] Rash    Medication list has been reviewed and updated.  Current Outpatient Medications on File Prior to Visit  Medication Sig Dispense Refill  . azelastine (ASTELIN) 0.1 % nasal spray Place 2 sprays into both nostrils 2 (two) times daily. (Patient taking differently: Place 2 sprays into both nostrils as needed. ) 30 mL 6  . citalopram (CELEXA) 40 MG tablet Take 1 tablet (40 mg total) by mouth daily. 90 tablet 0  . clonazePAM (KLONOPIN) 0.5 MG tablet Take 1 tablet (0.5 mg total) by mouth 2 (two) times daily. (Patient taking differently: Take 0.5 mg by mouth daily. ) 180 tablet 0  . DEXILANT 60 MG capsule TAKE 1 CAPSULE BY MOUTH EVERY DAY 90 capsule 1  . Dulaglutide (TRULICITY) 1.5 IF/0.2DX SOPN Inject 1.5 mg per week for diabetes 12 pen 2  . fluticasone (FLONASE) 50 MCG/ACT nasal spray as needed.    . folic acid (FOLVITE) 1 MG tablet TAKE 1 TABLET BY MOUTH EVERY DAY 90 tablet 3  . glucose blood test strip Use  to check glucose 1-2x daily 100 each 12  . Insulin Glargine (BASAGLAR KWIKPEN) 100 UNIT/ML SOPN Start with 10 units daily, increase as directed by MD.  Estimate final dose will be 25u daily 45 mL 3  . Insulin Pen Needle (PEN NEEDLES) 32G X 5 MM MISC 1 each by Does not apply route daily. 100 each 3  . lisinopril-hydrochlorothiazide (PRINZIDE,ZESTORETIC) 10-12.5 MG tablet TAKE 1/2 TABLET ONCE A DAY. MAY INCREASE TO WHOLE TABLET IF DIRECTED BY MD 90  tablet 3  . loratadine (CLARITIN) 10 MG tablet Take 1 tablet (10 mg total) by mouth daily. 90 tablet 3  . Multiple Vitamins-Minerals (MULTIVITAMIN WITH MINERALS) tablet Take 1 tablet by mouth daily.    . Omega-3 Fatty Acids (FISH OIL) 1000 MG CAPS Take by mouth.    . pioglitazone (ACTOS) 30 MG tablet TAKE 1 TABLET BY MOUTH EVERY DAY 90 tablet 1  . rosuvastatin (CRESTOR) 40 MG tablet TAKE 1 TABLET BY MOUTH EVERY DAY 90 tablet 0  . ARIPiprazole (ABILIFY) 5 MG tablet Take 2.5 mg by mouth daily.     No current facility-administered medications on file prior to visit.     Review of Systems:  As per HPI- otherwise negative. No fever chills, no chest pain or shortness of breath   Physical Examination: Vitals:   10/24/18 0912  BP: 114/78  Pulse: 78  Resp: 16  Temp: 97.8 F (36.6 C)  SpO2: 98%   Vitals:   10/24/18 0912  Weight: 227 lb (103 kg)  Height: 5' 6"  (1.676 m)   Body mass index is 36.64 kg/m. Ideal Body Weight: Weight in (lb) to have BMI = 25: 154.6  GEN: WDWN, NAD, Non-toxic, A & O x 3, obese, but has lost weight.  Appears more cheerful and content than typical HEENT: Atraumatic, Normocephalic. Neck supple. No masses, No LAD. Bilateral TM wnl, oropharynx normal.  PEERL,EOMI.   Nasal cavity shows mucus and erythema.  She has frontal sinus tenderness to percussion Ears and Nose: No external deformity. CV: RRR, No M/G/R. No JVD. No thrill. No extra heart sounds. PULM: CTA B, no wheezes, crackles, rhonchi. No retractions. No resp.  distress. No accessory muscle use. ABD: S, NT, ND, +BS. No rebound. No HSM. EXTR: No c/c/e NEURO Normal gait.  PSYCH: Normally interactive. Conversant. Not depressed or anxious appearing.  Calm demeanor.    Assessment and Plan: .Transaminitis - Plan: Comprehensive metabolic panel  Type 2 diabetes mellitus with hyperglycemia, without long-term current use of insulin (HCC) - Plan: Hemoglobin A1c  NASH (nonalcoholic steatohepatitis) - Plan: Comprehensive metabolic panel  HTN (hypertension), benign  Mixed hyperlipidemia - Plan: Lipid panel  Acute non-recurrent frontal sinusitis - Plan: amoxicillin (AMOXIL) 500 MG capsule  Here today for follow-up visit.  Sheniqua's mood is improved, she has been working on weight loss and has gotten a part-time job.  Overall she is feeling much more satisfied with her life. We started her on long-acting insulin, she is up to 20 units a day.  Her home sugars have been improved.  We will check an A1c for today. History of nonalcoholic liver disease.  We will check her LFTs today, and see how she is doing. Check lipids today Concern of acute frontal sinusitis.  Debroah has numerous drug allergies.  She reports being able to take amoxicillin without any issues.  Prescribed her a course of amoxicillin, she will let me know if any problems with this medication   Signed Lamar Blinks, MD  Received her labs, as below  Results for orders placed or performed in visit on 10/24/18  Comprehensive metabolic panel  Result Value Ref Range   Sodium 140 135 - 145 mEq/L   Potassium 4.9 3.5 - 5.1 mEq/L   Chloride 102 96 - 112 mEq/L   CO2 28 19 - 32 mEq/L   Glucose, Bld 160 (H) 70 - 99 mg/dL   BUN 9 6 - 23 mg/dL   Creatinine, Ser 0.82 0.40 - 1.20 mg/dL   Total  Bilirubin 0.6 0.2 - 1.2 mg/dL   Alkaline Phosphatase 120 (H) 39 - 117 U/L   AST 80 (H) 0 - 37 U/L   ALT 62 (H) 0 - 35 U/L   Total Protein 7.2 6.0 - 8.3 g/dL   Albumin 4.2 3.5 - 5.2 g/dL   Calcium 10.1  8.4 - 10.5 mg/dL   GFR 72.57 >60.00 mL/min  Hemoglobin A1c  Result Value Ref Range   Hgb A1c MFr Bld 10.9 (H) 4.6 - 6.5 %  Lipid panel  Result Value Ref Range   Cholesterol 227 (H) 0 - 200 mg/dL   Triglycerides 226.0 (H) 0.0 - 149.0 mg/dL   HDL 57.80 >39.00 mg/dL   VLDL 45.2 (H) 0.0 - 40.0 mg/dL   Total CHOL/HDL Ratio 4    NonHDL 169.07   LDL cholesterol, direct  Result Value Ref Range   Direct LDL 140.0 mg/dL    A1c is slightly better Cholesterol is slightly worse LFTs improved We will need to call patient to discuss  Called 1/24 but did not reach  Will try back  Called on 1/29 and reached her Went over her labs as above Her LFTS are better Will increase basaglar by 2 units She will see me in 3 months. Encouraged her to keep working on diet and exercise and not get discouraged.  Continue to avoid etoh Letter to pt

## 2018-10-24 ENCOUNTER — Encounter: Payer: Self-pay | Admitting: Family Medicine

## 2018-10-24 ENCOUNTER — Ambulatory Visit: Payer: BLUE CROSS/BLUE SHIELD | Admitting: Family Medicine

## 2018-10-24 VITALS — BP 114/78 | HR 78 | Temp 97.8°F | Resp 16 | Ht 66.0 in | Wt 227.0 lb

## 2018-10-24 DIAGNOSIS — R7401 Elevation of levels of liver transaminase levels: Secondary | ICD-10-CM

## 2018-10-24 DIAGNOSIS — E782 Mixed hyperlipidemia: Secondary | ICD-10-CM | POA: Diagnosis not present

## 2018-10-24 DIAGNOSIS — I10 Essential (primary) hypertension: Secondary | ICD-10-CM | POA: Diagnosis not present

## 2018-10-24 DIAGNOSIS — K7581 Nonalcoholic steatohepatitis (NASH): Secondary | ICD-10-CM | POA: Diagnosis not present

## 2018-10-24 DIAGNOSIS — E1165 Type 2 diabetes mellitus with hyperglycemia: Secondary | ICD-10-CM

## 2018-10-24 DIAGNOSIS — R740 Nonspecific elevation of levels of transaminase and lactic acid dehydrogenase [LDH]: Secondary | ICD-10-CM | POA: Diagnosis not present

## 2018-10-24 DIAGNOSIS — J0110 Acute frontal sinusitis, unspecified: Secondary | ICD-10-CM

## 2018-10-24 LAB — COMPREHENSIVE METABOLIC PANEL
ALK PHOS: 120 U/L — AB (ref 39–117)
ALT: 62 U/L — AB (ref 0–35)
AST: 80 U/L — AB (ref 0–37)
Albumin: 4.2 g/dL (ref 3.5–5.2)
BILIRUBIN TOTAL: 0.6 mg/dL (ref 0.2–1.2)
BUN: 9 mg/dL (ref 6–23)
CALCIUM: 10.1 mg/dL (ref 8.4–10.5)
CHLORIDE: 102 meq/L (ref 96–112)
CO2: 28 mEq/L (ref 19–32)
CREATININE: 0.82 mg/dL (ref 0.40–1.20)
GFR: 72.57 mL/min (ref >60.00)
Glucose, Bld: 160 mg/dL — ABNORMAL HIGH (ref 70–99)
Potassium: 4.9 mEq/L (ref 3.5–5.1)
SODIUM: 140 meq/L (ref 135–145)
TOTAL PROTEIN: 7.2 g/dL (ref 6.0–8.3)

## 2018-10-24 LAB — LIPID PANEL
Cholesterol: 227 mg/dL — ABNORMAL HIGH (ref 0–200)
HDL: 57.8 mg/dL (ref >39.00)
NonHDL: 169.07
Total CHOL/HDL Ratio: 4
Triglycerides: 226 mg/dL — ABNORMAL HIGH (ref 0.0–149.0)
VLDL: 45.2 mg/dL — AB (ref 0.0–40.0)

## 2018-10-24 LAB — LDL CHOLESTEROL, DIRECT: LDL DIRECT: 140 mg/dL

## 2018-10-24 LAB — HEMOGLOBIN A1C: HEMOGLOBIN A1C: 10.9 % — AB (ref 4.6–6.5)

## 2018-10-24 MED ORDER — AMOXICILLIN 500 MG PO CAPS: 1000.0000 mg | ORAL_CAPSULE | Freq: Two times a day (BID) | ORAL | 0 refills | Status: DC

## 2018-10-24 NOTE — Patient Instructions (Signed)
It was good to see you today, I am glad that you are doing well! We will be in touch with your labs ASAP.  I expect we will see improvement of your A1c, though we can adjust her insulin if needed We will also check your cholesterol and your liver function today. I am hoping that your liver function will be improved.  If there is still a concern, we will have you follow-up with Dr. Collene Mares Continue to work on diet and exercise We will treat you for sinus infection with amoxicillin for 10 days.  If any sign of allergic reaction, stop taking this and let me know

## 2018-10-30 ENCOUNTER — Telehealth: Payer: Self-pay

## 2018-10-30 NOTE — Telephone Encounter (Signed)
Copied from [place] [id number]. Topic: General - Other >> Oct 30, 2018  3:41 PM Antonieta Iba C wrote: Reason for CRM: pt says that she is returning Dr. Lillie Fragmin call for results.   CB: [phone]

## 2018-10-31 NOTE — Telephone Encounter (Signed)
Relation to pt: self  Call back number: [phone]   Reason for call:  Patient stating she's returning Dr. Lorelei Pont call regarding lab results

## 2018-12-10 ENCOUNTER — Telehealth: Payer: Self-pay | Admitting: Family Medicine

## 2018-12-10 NOTE — Telephone Encounter (Addendum)
Copied from [place] [id number]. Topic: General - Other >> Dec 10, 2018 11:26 AM Lennox Solders wrote: Reason for CRM: pt is requesting dr copland to call her in abx for sinus infection. Pt is having facial pain, green mucus . Pt last seen dr copland 10/24/2018. [facility] [address]. Pt states she has been seeing dr copland for over 20 years. Pt has been using saline to clean her nose out. Pt was offered an appt

## 2018-12-11 NOTE — Telephone Encounter (Signed)
Okay thank you

## 2018-12-11 NOTE — Telephone Encounter (Signed)
Called patient to have her come in to be seen. I explained to her that we could not prescribe antibiotics without being seen. She states she is not well enough to drive and and her husband is at work and can not bring her in. She states she has some left over amoxicillin she found and started taking yesterday and has started to feel better since taking it. She has enough for 5 days worth. She will call back to schedule appointment to see you if she continues to feel bad.

## 2018-12-12 NOTE — Progress Notes (Signed)
[place] [facility] [address], [place], [place] [address] [phone]  Date:  12/13/2018   Name:  Natalie Richard   DOB:  1966-02-04   MRN:  [id number]  PCP:  Darreld Mclean, MD    Chief Complaint: Sinusitis (facial pain, fatigue, coughing up blood,took amoxicillin, possible low grade fever, no traveling)   History of Present Illness:  Natalie Richard is a 53 y.o. very pleasant female patient who presents with the following:  Patient with history of diabetes, hypertension, Karlene Lineman, obesity.  Here today with concern of possible sinus infection I last saw her in January for follow-up.  At that point she had lost some weight, was drinking less alcohol, and had gotten a job at a [employer].  She was overall feeling much more positive  Her last A1c showed improvement, LFTs improved She has noted sx of a possible sinus infection since this past Sunday- today is Thursday She had some amox left over and started taking it on Monday- she is taking 1000 BID and will run out tomorrow She has multiple abx allergies including to cephalosporins and augmentin, but reports that she is taking amoxicillin with no SE currently  She is not sure if she had a fever- did not check her temperature  No recent travel out of our local area - has not left in the last 2 weeks  She may cough at night, but does not have any SOB or other respiratory sx  She is blowing green mucus from her nose Her nasal sx are the main concern However she does feel like she is improving with the amoxicillin that she is currently taking  Patient Active Problem List   Diagnosis Date Noted  . NASH (nonalcoholic steatohepatitis) 07/26/2015  . BMI 39.0-39.9,adult 03/19/2012  . Hematoma 03/19/2012  . HTN (hypertension), benign 01/09/2012  . Osteopenia 01/09/2012  . Migraines 01/09/2012  . Insomnia 01/09/2012  . Anxiety and depression 01/09/2012  . Hyperlipidemia  01/09/2012  . Diabetes mellitus 01/09/2012  . Allergic rhinitis 01/09/2012    Past Medical History:  Diagnosis Date  . Anxiety   . 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 Depression   . Diabetes mellitus   . Diabetes mellitus, type II ([facility])   . GERD (gastroesophageal reflux disease)   . Hematoma    on buttock  . Hyperlipidemia   . Hypertension   . Migraine   . Seasonal allergies     Past Surgical History:  Procedure Laterality Date  . ABDOMINAL HYSTERECTOMY  2004   PCOS  . EYE SURGERY    . INNER EAR SURGERY Left 08/2016    Social History   Tobacco Use  . Smoking status: Never Smoker  . Smokeless tobacco: Never Used  Substance Use Topics  . Alcohol use: Yes    Alcohol/week: 8.0 - 10.0 standard drinks    Types: 6 Cans of beer, 2 Shots of liquor per week    Comment: weekly  . Drug use: No    Family History  Problem Relation Age of Onset  . Cancer Mother 41       colon  . Diabetes Mother   . Rheum arthritis Mother   . Dementia Mother   . Depression Mother   . Cancer Father 35       bone and prostate  . Cancer Maternal Grandmother        breast  .  Diabetes Maternal Grandfather   . Depression Cousin     Allergies  Allergen Reactions  . Buspar [Buspirone] Other (See Comments)    Lip swelling and rash numbness   . Augmentin [Amoxicillin-Pot Clavulanate] Nausea And Vomiting  . Biaxin [Clarithromycin]   . Ceftin [Cefuroxime] Swelling    Eye swelling, shortness of breath   . Ciprofloxacin     Developed itching with IV form in the hospital, but has since taken PO without a problem  . Dapagliflozin Other (See Comments)    Burning upon urination. Increased blood sugar.  . Doxycycline Itching  . Levaquin [Levofloxacin] Hives  . Metformin And Related Nausea And Vomiting  . Phenazopyridine Hcl Other (See Comments)    Does not remember reaction  . Pyridium [Phenazopyridine Hcl]     Does not remember reaction  . Sulfa Antibiotics     As a  younger person had itching, but has taken more recently and did ok  . Latex Rash  . Lorabid [Loracarbef] Rash    Medication list has been reviewed and updated.  Current Outpatient Medications on File Prior to Visit  Medication Sig Dispense Refill  . amoxicillin (AMOXIL) 500 MG capsule Take 2 capsules (1,000 mg total) by mouth 2 (two) times daily. 40 capsule 0  . ARIPiprazole (ABILIFY) 5 MG tablet Take 2.5 mg by mouth daily.    . citalopram (CELEXA) 40 MG tablet Take 1 tablet (40 mg total) by mouth daily. 90 tablet 0  . clonazePAM (KLONOPIN) 0.5 MG tablet Take 1 tablet (0.5 mg total) by mouth 2 (two) times daily. (Patient taking differently: Take 0.5 mg by mouth daily. ) 180 tablet 0  . DEXILANT 60 MG capsule TAKE 1 CAPSULE BY MOUTH EVERY DAY 90 capsule 1  . Dulaglutide (TRULICITY) 1.5 VZ/8.5YI SOPN Inject 1.5 mg per week for diabetes 12 pen 2  . fluticasone (FLONASE) 50 MCG/ACT nasal spray as needed.    . folic acid (FOLVITE) 1 MG tablet TAKE 1 TABLET BY MOUTH EVERY DAY 90 tablet 3  . glucose blood test strip Use to check glucose 1-2x daily 100 each 12  . Insulin Glargine (BASAGLAR KWIKPEN) 100 UNIT/ML SOPN Start with 10 units daily, increase as directed by MD.  Estimate final dose will be 25u daily 45 mL 3  . Insulin Pen Needle (PEN NEEDLES) 32G X 5 MM MISC 1 each by Does not apply route daily. 100 each 3  . lisinopril-hydrochlorothiazide (PRINZIDE,ZESTORETIC) 10-12.5 MG tablet TAKE 1/2 TABLET ONCE A DAY. MAY INCREASE TO WHOLE TABLET IF DIRECTED BY MD 90 tablet 3  . loratadine (CLARITIN) 10 MG tablet Take 1 tablet (10 mg total) by mouth daily. 90 tablet 3  . Multiple Vitamins-Minerals (MULTIVITAMIN WITH MINERALS) tablet Take 1 tablet by mouth daily.    . pioglitazone (ACTOS) 30 MG tablet TAKE 1 TABLET BY MOUTH EVERY DAY 90 tablet 1  . rosuvastatin (CRESTOR) 40 MG tablet TAKE 1 TABLET BY MOUTH EVERY DAY 90 tablet 0   No current facility-administered medications on file prior to visit.      Review of Systems:  As per HPI- otherwise negative. No vomiting No rash Just coming from work today  Physical Examination: Vitals:   12/13/18 1428  BP: 112/80  Pulse: 92  Resp: 16  Temp: 98.1 F (36.7 C)  SpO2: 96%   Vitals:   12/13/18 1428  Weight: 236 lb (107 kg)  Height: 5' 6"  (1.676 m)   Body mass index is 38.09 kg/m. Ideal Body Weight:  Weight in (lb) to have BMI = 25: 154.6  GEN: WDWN, NAD, Non-toxic, A & O x 3, obese, otherwise looks well  HEENT: Atraumatic, Normocephalic. Neck supple. No masses, No LAD.  Bilateral TM wnl, oropharynx normal.  PEERL,EOMI.  Nasal cavity is inflamed and displays a lot of discharge/ mucus  Ears and Nose: No external deformity. CV: RRR, No M/G/R. No JVD. No thrill. No extra heart sounds. PULM: CTA B, no wheezes, crackles, rhonchi. No retractions. No resp. distress. No accessory muscle use. EXTR: No c/c/e NEURO Normal gait.  PSYCH: Normally interactive. Conversant. Not depressed or anxious appearing.  Calm demeanor.    Assessment and Plan: Acute non-recurrent frontal sinusitis - Plan: amoxicillin (AMOXIL) 500 MG capsule  Here today with a partially treated sinus infection- she used some abx that were leftover at home Will give her enough to complete 10 days of treatment She will let me know if not continuing to improve   Signed Lamar Blinks, MD

## 2018-12-13 ENCOUNTER — Ambulatory Visit (INDEPENDENT_AMBULATORY_CARE_PROVIDER_SITE_OTHER): Payer: BLUE CROSS/BLUE SHIELD | Admitting: Family Medicine

## 2018-12-13 ENCOUNTER — Encounter: Payer: Self-pay | Admitting: Family Medicine

## 2018-12-13 ENCOUNTER — Other Ambulatory Visit: Payer: Self-pay

## 2018-12-13 VITALS — BP 112/80 | HR 92 | Temp 98.1°F | Resp 16 | Ht 66.0 in | Wt 236.0 lb

## 2018-12-13 DIAGNOSIS — J0110 Acute frontal sinusitis, unspecified: Secondary | ICD-10-CM | POA: Diagnosis not present

## 2018-12-13 MED ORDER — AMOXICILLIN 500 MG PO CAPS: 1000.0000 mg | ORAL_CAPSULE | Freq: Two times a day (BID) | ORAL | 0 refills | Status: DC

## 2018-12-13 NOTE — Patient Instructions (Signed)
We will use an other 7 days of amoxicillin for your sinus infection Let me know if you are not continuing to get better- sooner if you are getting worse

## 2019-01-15 ENCOUNTER — Telehealth: Payer: Self-pay | Admitting: Family Medicine

## 2019-01-15 NOTE — Telephone Encounter (Signed)
Copied from [place] [id number]. Topic: Quick Communication - See Telephone Encounter >> Jan 15, 2019  2:32 PM Robina Ade, Helene Kelp D wrote: CRM for notification. See Telephone encounter for: 01/15/19. Patient called and said that she has had a watery diarrhea for 24 hrs. She stated that she ate a dressing that didn't look right but still ate it. She would like call back from Dr. Lorelei Pont CMA.

## 2019-01-15 NOTE — Telephone Encounter (Signed)
Virtual visit scheduled.

## 2019-01-16 ENCOUNTER — Ambulatory Visit (INDEPENDENT_AMBULATORY_CARE_PROVIDER_SITE_OTHER): Payer: BLUE CROSS/BLUE SHIELD | Admitting: Family Medicine

## 2019-01-16 ENCOUNTER — Other Ambulatory Visit: Payer: Self-pay

## 2019-01-16 ENCOUNTER — Encounter: Payer: Self-pay | Admitting: Family Medicine

## 2019-01-16 DIAGNOSIS — E1165 Type 2 diabetes mellitus with hyperglycemia: Secondary | ICD-10-CM

## 2019-01-16 DIAGNOSIS — R197 Diarrhea, unspecified: Secondary | ICD-10-CM | POA: Diagnosis not present

## 2019-01-16 NOTE — Progress Notes (Signed)
[place] at [facility] [address], [place], [place] [address] [phone]  Date:  01/16/2019   Name:  Natalie Richard   DOB:  1965/10/13   MRN:  [id number]  PCP:  Darreld Mclean, MD    Chief Complaint: No chief complaint on file.   History of Present Illness:  Natalie Richard is a 53 y.o. very pleasant female patient who presents with the following:  Virtual visit today due to COVID-19 pandemic- done via phone Patient location is home Provider location is home Patient ID confirmed with name and date of birth, she gave consent for virtual visit today Patient with history of diabetes, hypertension, Karlene Lineman, obesity Seen just about one month ago with sinusitis- treated with amoxicillin.  She had actually already started treatment with antibiotics and at home, I gave her enough to complete 10 days of therapy  Lab Results  Component Value Date   HGBA1C 10.9 (H) 10/24/2018   Her diabetes has been under poor control-her last A1c in January showed slight worsening.  We increased her insulin at that time and plan for a follow-up visit and A1c in 3 months  Celexa Clonazepam Dexilant Trulicity weekly Basaglar KwikPen- she is taking 25 units a day Actos 30 mg Lisinopril/HCTZ Crestor Loratadine  Today Naila notes that she started having watery stools after she ate lunch 2 days ago This has continued off and on She had some probiotics that she started taking yesterday So far today she had some stool, but not as bad-she thinks she is actually getting better No vomiting No blood in her stools No belly pain She is eating, but eating tends to trigger diarrhea She is staying hydrated  Her husband has not had the same thing No unusual foods really- she did eat take out on Monday prior to onset of symptoms.  Her husband ate from the same restaurant, but he had chicken and she ate shrimp.  She tried some pepto  She is  having maybe 7 stools a day She is not feeling lightheaded, she is staying hydrated  She notes that her fasting glucose is about 150 now- this is great news.   She is mostly staying home. 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 NASH (nonalcoholic steatohepatitis) 07/26/2015  . BMI 39.0-39.9,adult 03/19/2012  . Hematoma 03/19/2012  . HTN (hypertension), benign 01/09/2012  . Osteopenia 01/09/2012  . Migraines 01/09/2012  . Insomnia 01/09/2012  . Anxiety and depression 01/09/2012  . Hyperlipidemia 01/09/2012  . Diabetes mellitus 01/09/2012  . Allergic rhinitis 01/09/2012    Past Medical History:  Diagnosis Date  . Anxiety   . 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 Depression   . Diabetes mellitus   . Diabetes mellitus, type II ([facility])   . GERD (gastroesophageal reflux disease)   . Hematoma    on buttock  . Hyperlipidemia   . Hypertension   . Migraine   . Seasonal allergies     Past Surgical History:  Procedure Laterality Date  . ABDOMINAL HYSTERECTOMY  2004   PCOS  . EYE SURGERY    . INNER EAR SURGERY Left 08/2016    Social History  Tobacco Use  . Smoking status: Never Smoker  . Smokeless tobacco: Never Used  Substance Use Topics  . Alcohol use: Yes    Alcohol/week: 8.0 - 10.0 standard drinks    Types: 6 Cans of beer, 2 Shots of liquor per week    Comment: weekly  . Drug use: No    Family History  Problem Relation Age of Onset  . Cancer Mother 25       colon  . Diabetes Mother   . Rheum arthritis Mother   . Dementia Mother   . Depression Mother   . Cancer Father 70       bone and prostate  . Cancer Maternal Grandmother        breast  . Diabetes Maternal Grandfather   . Depression Cousin     Allergies  Allergen Reactions  . Buspar  [Buspirone] Other (See Comments)    Lip swelling and rash numbness   . Augmentin [Amoxicillin-Pot Clavulanate] Nausea And Vomiting  . Biaxin [Clarithromycin]   . Ceftin [Cefuroxime] Swelling    Eye swelling, shortness of breath   . Ciprofloxacin     Developed itching with IV form in the hospital, but has since taken PO without a problem  . Dapagliflozin Other (See Comments)    Burning upon urination. Increased blood sugar.  . Doxycycline Itching  . Levaquin [Levofloxacin] Hives  . Metformin And Related Nausea And Vomiting  . Phenazopyridine Hcl Other (See Comments)    Does not remember reaction  . Pyridium [Phenazopyridine Hcl]     Does not remember reaction  . Sulfa Antibiotics     As a younger person had itching, but has taken more recently and did ok  . Latex Rash  . Lorabid [Loracarbef] Rash    Medication list has been reviewed and updated.  Current Outpatient Medications on File Prior to Visit  Medication Sig Dispense Refill  . amoxicillin (AMOXIL) 500 MG capsule Take 2 capsules (1,000 mg total) by mouth 2 (two) times daily. 40 capsule 0  . amoxicillin (AMOXIL) 500 MG capsule Take 2 capsules (1,000 mg total) by mouth 2 (two) times daily. 28 capsule 0  . ARIPiprazole (ABILIFY) 5 MG tablet Take 2.5 mg by mouth daily.    . citalopram (CELEXA) 40 MG tablet Take 1 tablet (40 mg total) by mouth daily. 90 tablet 0  . clonazePAM (KLONOPIN) 0.5 MG tablet Take 1 tablet (0.5 mg total) by mouth 2 (two) times daily. (Patient taking differently: Take 0.5 mg by mouth daily. ) 180 tablet 0  . DEXILANT 60 MG capsule TAKE 1 CAPSULE BY MOUTH EVERY DAY 90 capsule 1  . Dulaglutide (TRULICITY) 1.5 YI/0.1KP SOPN Inject 1.5 mg per week for diabetes 12 pen 2  . fluticasone (FLONASE) 50 MCG/ACT nasal spray as needed.    . folic acid (FOLVITE) 1 MG tablet TAKE 1 TABLET BY MOUTH EVERY DAY 90 tablet 3  . glucose blood test strip Use to check glucose 1-2x daily 100 each 12  . Insulin Glargine (BASAGLAR  KWIKPEN) 100 UNIT/ML SOPN Start with 10 units daily, increase as directed by MD.  Estimate final dose will be 25u daily 45 mL 3  . Insulin Pen Needle (PEN NEEDLES) 32G X 5 MM MISC 1 each by Does not apply route daily. 100 each 3  . lisinopril-hydrochlorothiazide (PRINZIDE,ZESTORETIC) 10-12.5 MG tablet TAKE 1/2 TABLET ONCE A DAY. MAY INCREASE TO WHOLE TABLET IF DIRECTED BY MD 90 tablet 3  . loratadine (CLARITIN) 10 MG tablet  Take 1 tablet (10 mg total) by mouth daily. 90 tablet 3  . Multiple Vitamins-Minerals (MULTIVITAMIN WITH MINERALS) tablet Take 1 tablet by mouth daily.    . pioglitazone (ACTOS) 30 MG tablet TAKE 1 TABLET BY MOUTH EVERY DAY 90 tablet 1  . rosuvastatin (CRESTOR) 40 MG tablet TAKE 1 TABLET BY MOUTH EVERY DAY 90 tablet 0   No current facility-administered medications on file prior to visit.     Review of Systems:  As per HPI- otherwise negative. No fever chills  Physical Examination: There were no vitals filed for this visit. There were no vitals filed for this visit. There is no height or weight on file to calculate BMI. Ideal Body Weight:   She is not checking her BP at home  Spoke to patient over the phone today.  She sounds well, no apparent distress.  No wheezing, coughing, tachypnea is evident  Assessment and Plan: Diarrhea of presumed infectious origin - Plan: C. difficile GDH and Toxin A/B  Type 2 diabetes mellitus with hyperglycemia, without long-term current use of insulin ([place]) - Plan: Hemoglobin A1c  Zyasia called in today due to diarrhea for the last 2 days.  Suspect a viral etiology, let we discussed this in detail.  She feels like she is actually getting better today.  We discussed possibility of C. difficile, and I advised her to come in and pick up a stool sample kit.  However, she feels that she is improved today in hopes that this will pass.  In that case, I have encouraged her to hydrate with liquids, perhaps low sugar Gatorade.  She may also try  some Imodium over-the-counter. I did go ahead and order the C. difficile test, she will come in and pick up the kit tomorrow if symptoms are not much better We are also coming due for an A1c.  I ordered an A1c for her in case she happens to come in for stool sample.  Otherwise, she will plan to see me in the next 1 to 2 months for a face-to-face visit and labs  Invited and answered all questions.  Sapna is encouraged to contact me again if any concerns Spoke to patient today for 10 minutes and 25 seconds, time to my iPhone Signed Lamar Blinks, MD

## 2019-01-23 ENCOUNTER — Ambulatory Visit: Payer: Self-pay | Admitting: Family Medicine

## 2019-02-12 ENCOUNTER — Other Ambulatory Visit: Payer: Self-pay | Admitting: Family Medicine

## 2019-02-12 DIAGNOSIS — K219 Gastro-esophageal reflux disease without esophagitis: Secondary | ICD-10-CM

## 2019-03-06 ENCOUNTER — Telehealth: Payer: Self-pay | Admitting: *Deleted

## 2019-03-06 ENCOUNTER — Other Ambulatory Visit: Payer: Self-pay

## 2019-03-06 ENCOUNTER — Ambulatory Visit: Payer: Self-pay | Admitting: *Deleted

## 2019-03-06 ENCOUNTER — Ambulatory Visit (INDEPENDENT_AMBULATORY_CARE_PROVIDER_SITE_OTHER): Payer: BC Managed Care – PPO | Admitting: Family Medicine

## 2019-03-06 ENCOUNTER — Telehealth: Payer: Self-pay

## 2019-03-06 ENCOUNTER — Encounter: Payer: Self-pay | Admitting: Family Medicine

## 2019-03-06 DIAGNOSIS — Z20822 Contact with and (suspected) exposure to covid-19: Secondary | ICD-10-CM

## 2019-03-06 DIAGNOSIS — J0110 Acute frontal sinusitis, unspecified: Secondary | ICD-10-CM | POA: Diagnosis not present

## 2019-03-06 DIAGNOSIS — E1165 Type 2 diabetes mellitus with hyperglycemia: Secondary | ICD-10-CM

## 2019-03-06 DIAGNOSIS — E119 Type 2 diabetes mellitus without complications: Secondary | ICD-10-CM

## 2019-03-06 DIAGNOSIS — R5381 Other malaise: Secondary | ICD-10-CM

## 2019-03-06 MED ORDER — PIOGLITAZONE HCL 30 MG PO TABS: 30.0000 mg | ORAL_TABLET | Freq: Every day | ORAL | 3 refills | Status: DC

## 2019-03-06 MED ORDER — AMOXICILLIN 500 MG PO CAPS: 1000.0000 mg | ORAL_CAPSULE | Freq: Two times a day (BID) | ORAL | 0 refills | Status: DC

## 2019-03-06 MED ORDER — DULAGLUTIDE 1.5 MG/0.5ML ~~LOC~~ SOAJ: SUBCUTANEOUS | 2 refills | Status: DC

## 2019-03-06 NOTE — Telephone Encounter (Signed)
Contacted pt to arrange COVID testing per Dr Janett Billow Copland; pt offered and accepted appointment at [facility] site 03/06/2019 at 1430; pt given location, address, and instructions that she and all occupants of her vehicle should wear a mask; she verbalizes understanding; orders placed per protocol; will route to provider for notification.

## 2019-03-06 NOTE — Telephone Encounter (Signed)
Pt reports multiple symptoms: Headache, sore throat, dizziness, diarrhea, "Taste off, not right." Pt has appt with Dr. Lorelei Pont at 1300 today. Pt stated she had called and cancelled last night, appt still scheduled. Pt states will keep appt. [place] verified with practice, Kenny[facility] Lose, appt secured.  Reports onset of symptoms last Wednesday. Denies SOB, unsure if febrile. States husband works at facility that has had "12 positive cases."  Requesting Covid-19 testing. Care advise given per protocol. Advised to keep appt today with Dr. Lorelei Pont; verbalizes understanding.  Reason for Disposition . [1] COVID-19 infection suspected by caller or triager AND [2] mild symptoms (cough, fever, or others) AND [9] no complications or SOB  Answer Assessment - Initial Assessment Questions 1. COVID-19 DIAGNOSIS: "Who made your Coronavirus (COVID-19) diagnosis?" "Was it confirmed by a positive lab test?" If not diagnosed by a HCP, ask "Are there lots of cases (community spread) where you live?" (See public health department website, if unsure)     NA 2. ONSET: "When did the COVID-19 symptoms start?"      Last Wednesday 3. WORST SYMPTOM: "What is your worst symptom?" (e.g., cough, fever, shortness of breath, muscle aches)     Multiple symptoms 4. COUGH: "Do you have a cough?" If so, ask: "How bad is the cough?"       yes 5. FEVER: "Do you have a fever?" If so, ask: "What is your temperature, how was it measured, and when did it start?"     unsure 6. RESPIRATORY STATUS: "Describe your breathing?" (e.g., shortness of breath, wheezing, unable to speak)      no 7. BETTER-SAME-WORSE: "Are you getting better, staying the same or getting worse compared to yesterday?"  If getting worse, ask, "In what way?"     worse 8. HIGH RISK DISEASE: "Do you have any chronic medical problems?" (e.g., asthma, heart or lung disease, weak immune system, etc.)       10. OTHER SYMPTOMS: "Do you have any other symptoms?"  (e.g., chills, fatigue,  headache, loss of smell or taste, muscle pain, sore throat)       "Taste off" sore throat, diarrhea, dizziness  Protocols used: CORONAVIRUS (COVID-19) DIAGNOSED OR SUSPECTED-A-AH

## 2019-03-06 NOTE — Telephone Encounter (Signed)
Patient seen today with pcp. Referred patient to be tested for covid 19.

## 2019-03-06 NOTE — Progress Notes (Signed)
[facility] [address], [place], [place] [address] [phone]  Date:  03/06/2019   Name:  Natalie Richard   DOB:  03/13/1966   MRN:  [id number]  PCP:  Darreld Mclean, MD    Chief Complaint: No chief complaint on file.   History of Present Illness:  Natalie Richard is a 53 y.o. very pleasant female patient who presents with the following:  Virtual visit today for concern of headaches. Phone visit only Patient location is home, provider location is office Patient identity confirmed with 2 identifiers, she gives consent for virtual visit today We did another virtual visit in April, I last saw her in the office in March  History of diabetes, hypertension, obesity, NASH.   Lab Results  Component Value Date   HGBA1C 10.9 (H) 10/24/2018   Her last A1c check was in January, it was quite elevated (although improved) as above.  We had her increase her Basaglar dose, and plan to recheck A1c in 3 months Abilify 2.5 Celexa Clonazepam Dexilant Crestor 40 Lisinopril HCTZ Trulicity 1.5 weekly Basaglar- 25 Units Actos 30  Today she notes a HA for a week She is using advil cold and sinus She also had diarrhea for a couple of days during the same time period  She notes sinus congestion and blowing out mucus.   She has noted a cough now and then, no fever noted- she has not checked her temp however  She has noted a ST and nausea There have been several cases of COVID at her husband's work  Natalie Richard wonders if she might have a sinus infection versus COVID-19 infection She has numerous medication allergies, but has used amoxicillin with no issues.  She last took this medication in January of this year  Her blood sugar has been running 150- 200 am fasting am She is due for an A1c   Patient Active Problem List   Diagnosis Date Noted  . NASH (nonalcoholic steatohepatitis) 07/26/2015  . BMI 39.0-39.9,adult  03/19/2012  . Hematoma 03/19/2012  . HTN (hypertension), benign 01/09/2012  . Osteopenia 01/09/2012  . Migraines 01/09/2012  . Insomnia 01/09/2012  . Anxiety and depression 01/09/2012  . Hyperlipidemia 01/09/2012  . Diabetes mellitus 01/09/2012  . Allergic rhinitis 01/09/2012    Past Medical History:  Diagnosis Date  . Anxiety   . 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 Depression   . Diabetes mellitus   . Diabetes mellitus, type II ([place])   . GERD (gastroesophageal reflux disease)   . Hematoma    on buttock  . Hyperlipidemia   . Hypertension   . Migraine   . Seasonal allergies     Past Surgical History:  Procedure Laterality Date  . ABDOMINAL HYSTERECTOMY  2004   PCOS  . EYE SURGERY    . INNER EAR SURGERY Left 08/2016    Social History   Tobacco Use  . Smoking status: Never Smoker  . Smokeless tobacco: Never Used  Substance Use Topics  . Alcohol use: Yes    Alcohol/week: 8.0 - 10.0 standard drinks    Types: 6 Cans of beer, 2 Shots of liquor per week    Comment: weekly  . Drug use: No    Family History  Problem Relation Age of Onset  . Cancer Mother 48       colon  . Diabetes Mother   . Rheum arthritis  Mother   . Dementia Mother   . Depression Mother   . Cancer Father 58       bone and prostate  . Cancer Maternal Grandmother        breast  . Diabetes Maternal Grandfather   . Depression Cousin     Allergies  Allergen Reactions  . Buspar [Buspirone] Other (See Comments)    Lip swelling and rash numbness   . Augmentin [Amoxicillin-Pot Clavulanate] Nausea And Vomiting  . Biaxin [Clarithromycin]   . Ceftin [Cefuroxime] Swelling    Eye swelling, shortness of breath   . Ciprofloxacin     Developed itching with IV form in the hospital, but has since taken PO without a problem  . Dapagliflozin Other (See Comments)    Burning upon urination. Increased blood sugar.  . Doxycycline Itching  . Levaquin [Levofloxacin] Hives  . Metformin And  Related Nausea And Vomiting  . Phenazopyridine Hcl Other (See Comments)    Does not remember reaction  . Pyridium [Phenazopyridine Hcl]     Does not remember reaction  . Sulfa Antibiotics     As a younger person had itching, but has taken more recently and did ok  . Latex Rash  . Lorabid [Loracarbef] Rash    Medication list has been reviewed and updated.  Current Outpatient Medications on File Prior to Visit  Medication Sig Dispense Refill  . amoxicillin (AMOXIL) 500 MG capsule Take 2 capsules (1,000 mg total) by mouth 2 (two) times daily. 28 capsule 0  . ARIPiprazole (ABILIFY) 5 MG tablet Take 2.5 mg by mouth daily.    . citalopram (CELEXA) 40 MG tablet Take 1 tablet (40 mg total) by mouth daily. 90 tablet 0  . clonazePAM (KLONOPIN) 0.5 MG tablet Take 1 tablet (0.5 mg total) by mouth 2 (two) times daily. (Patient taking differently: Take 0.5 mg by mouth daily. ) 180 tablet 0  . DEXILANT 60 MG capsule TAKE 1 CAPSULE BY MOUTH EVERY DAY 90 capsule 1  . fluticasone (FLONASE) 50 MCG/ACT nasal spray as needed.    . folic acid (FOLVITE) 1 MG tablet TAKE 1 TABLET BY MOUTH EVERY DAY 90 tablet 3  . glucose blood test strip Use to check glucose 1-2x daily 100 each 12  . Insulin Glargine (BASAGLAR KWIKPEN) 100 UNIT/ML SOPN Start with 10 units daily, increase as directed by MD.  Estimate final dose will be 25u daily 45 mL 3  . Insulin Pen Needle (PEN NEEDLES) 32G X 5 MM MISC 1 each by Does not apply route daily. 100 each 3  . lisinopril-hydrochlorothiazide (PRINZIDE,ZESTORETIC) 10-12.5 MG tablet TAKE 1/2 TABLET ONCE A DAY. MAY INCREASE TO WHOLE TABLET IF DIRECTED BY MD 90 tablet 3  . loratadine (CLARITIN) 10 MG tablet Take 1 tablet (10 mg total) by mouth daily. 90 tablet 3  . Multiple Vitamins-Minerals (MULTIVITAMIN WITH MINERALS) tablet Take 1 tablet by mouth daily.    . rosuvastatin (CRESTOR) 40 MG tablet TAKE 1 TABLET BY MOUTH EVERY DAY 90 tablet 0   No current facility-administered medications  on file prior to visit.     Review of Systems:  As per HPI- otherwise negative.   Physical Examination: There were no vitals filed for this visit. There were no vitals filed for this visit. There is no height or weight on file to calculate BMI. Ideal Body Weight:    Spoke to pt on the phone today She sounds well, no cough, wheezing, distress is noted  Assessment and Plan: [facility]  Acute non-recurrent frontal sinusitis - Plan: amoxicillin (AMOXIL) 500 MG capsule  Type 2 diabetes mellitus with hyperglycemia, without long-term current use of insulin (HCC) - Plan: Dulaglutide (TRULICITY) 1.5 DV/7.6HY SOPN  Type 2 diabetes mellitus without complication, without long-term current use of insulin (HCC) - Plan: pioglitazone (ACTOS) 30 MG tablet  Virtual visit today for concern of illness.  Talyn has symptoms which may signify sinus infection.  However she is also concerned about COVID-19 exposure.  I will refer her to our community testing group so she can be tested.  I have advised her to self isolate while we wait on her results In the meantime we will treat with amoxicillin-she has allergy to numerous antibiotics, but has used amoxicillin as recently as this past January with no problems I asked her to see me in the office in the next 6 weeks or so for A1c and other lab work  Spoke to patient for 10: 50 today Signed Lamar Blinks, MD

## 2019-03-06 NOTE — Telephone Encounter (Signed)
Patient had virtual visit today see encounter 03/06/2019. Per provider patient is needed covid-19 testing due to symptoms. Routed to community pool.

## 2019-03-06 NOTE — Telephone Encounter (Signed)
Copied from [place] [id number]. Topic: Quick Communication - Appointment Cancellation >> Mar 05, 2019  4:46 PM Erick Blinks wrote: Patient called to cancel appointment scheduled for 03/06/2019. Patient has not rescheduled their appointment.  Route to department's PEC pool.

## 2019-03-08 ENCOUNTER — Telehealth: Payer: Self-pay | Admitting: Family Medicine

## 2019-03-08 LAB — NOVEL CORONAVIRUS, NAA: SARS-CoV-2, NAA: NOT DETECTED

## 2019-03-08 NOTE — Telephone Encounter (Signed)
Called pt to let her know that her COVID test was negative.  She is getting better  Results for orders placed or performed in visit on 03/06/19  Novel Coronavirus, NAA ([facility])  Result Value Ref Range   SARS-CoV-2, NAA Not Detected Not Detected

## 2019-07-22 ENCOUNTER — Other Ambulatory Visit: Payer: Self-pay

## 2019-07-22 ENCOUNTER — Ambulatory Visit (INDEPENDENT_AMBULATORY_CARE_PROVIDER_SITE_OTHER): Payer: BC Managed Care – PPO | Admitting: Medical

## 2019-07-22 ENCOUNTER — Telehealth: Payer: Self-pay | Admitting: *Deleted

## 2019-07-22 DIAGNOSIS — J0110 Acute frontal sinusitis, unspecified: Secondary | ICD-10-CM | POA: Diagnosis not present

## 2019-07-22 DIAGNOSIS — J309 Allergic rhinitis, unspecified: Secondary | ICD-10-CM

## 2019-07-22 MED ORDER — MONTELUKAST SODIUM 10 MG PO TABS: 10.0000 mg | ORAL_TABLET | Freq: Every day | ORAL | 3 refills | Status: DC

## 2019-07-22 MED ORDER — AMOXICILLIN 500 MG PO CAPS: 500.0000 mg | ORAL_CAPSULE | Freq: Three times a day (TID) | ORAL | 0 refills | Status: DC

## 2019-07-22 MED ORDER — BENZONATATE 100 MG PO CAPS: 100.0000 mg | ORAL_CAPSULE | Freq: Three times a day (TID) | ORAL | 0 refills | Status: DC | PRN

## 2019-07-22 NOTE — Telephone Encounter (Signed)
Left message on machine to see if she needs for [place] to fax over work to anywhere for her.

## 2019-07-22 NOTE — Progress Notes (Signed)
   Subjective:    Patient ID: Natalie Richard, female    DOB: January 14, 1966, 53 y.o.   MRN: [id number]  HPI .Virtual Visit via Telephone Note  I connected with Natalie Richard on 07/22/19 at  3:00 PM EDT by telephone and verified that I am speaking with the correct person using two identifiers.  Location: Patient: home Provider: office   I discussed the limitations, risks, security and privacy concerns of performing an evaluation and management service by telephone and the availability of in person appointments. I also discussed with the patient that there may be a patient responsible charge related to this service. The patient expressed understanding and agreed to proceed.   History of Present Illness: 2 days of sinus pressure, colored nasal drainage, left ear pain, mild st and dry cough. Pt states prone to get sinus infection in past. Pt has been taking advil cold and sinus but not helped.  Often will get sinus infection 2-3 times a year.  Pt is on loratadine and flonase. She has been sneezing.  Pt works part time.    Observations/Objective: General- no acute distress. Pleasant. Oriented. Normal speech.  Assessment and Plan: Probable sinus infection with allergic rhinitis. Rx azithromycin antibiotic, flonase nasal spray and benzonatate for cough.  Recommend not to work over next 2 days. Wrote work note. Stay at home to see if improve. If no improvement or worsening then notify me by Thursday morning in that event would get covid test. Not indicated presently.  Follow up in 7 days or as needed.  Mackie Pai, PA-C  Follow Up Instructions:    I discussed the assessment and treatment plan with the patient. The patient was provided an opportunity to ask questions and all were answered. The patient agreed with the plan and demonstrated an understanding of the instructions.   The patient was advised to call back or seek an in-person evaluation if the symptoms worsen or if  the condition fails to improve as anticipated.  I provided 25  minutes of non-face-to-face time during this encounter. 50% of time spent counseling pt on plan going forward and answering questions.   Mackie Pai, PA-C    Review of Systems  Constitutional: Positive for diaphoresis. Negative for chills, fatigue and fever.       Sweat some last night  HENT: Positive for congestion, sinus pressure, sinus pain and sore throat. Negative for postnasal drip.   Respiratory: Positive for cough. Negative for shortness of breath and wheezing.   Cardiovascular: Negative for chest pain and palpitations.  Gastrointestinal: Negative for abdominal pain.  Musculoskeletal: Negative for back pain.  Neurological: Negative for dizziness, speech difficulty, weakness and light-headedness.  Hematological: Negative for adenopathy. Does not bruise/bleed easily.  Psychiatric/Behavioral: Negative for behavioral problems and confusion.       Objective:   Physical Exam        Assessment & Plan:

## 2019-07-22 NOTE — Patient Instructions (Signed)
Probable sinus infection with allergic rhinitis. Rx azithromycin antibiotic, flonase nasal spray and benzonatate for cough.  Recommend not to work over next 2 days. Wrote work note. Stay at home to see if improve. If no improvement or worsening then notify me by Thursday morning in that event would get covid test. Not indicated presently.  Follow up in 7 days or as needed.

## 2019-07-23 NOTE — Telephone Encounter (Signed)
Patient returning call. She states her employers fax is not currently working. She inquired if it could be emailed instead.    [email or web address]

## 2019-07-23 NOTE — Telephone Encounter (Signed)
Work Teacher, adult education and patient notified.

## 2019-07-23 NOTE — Telephone Encounter (Signed)
Patient is returning a call to [facility] regarding where she can get the work note.  Please call her back at [phone]

## 2019-07-24 ENCOUNTER — Other Ambulatory Visit: Payer: Self-pay

## 2019-07-24 ENCOUNTER — Other Ambulatory Visit: Payer: Self-pay | Admitting: *Deleted

## 2019-07-24 DIAGNOSIS — Z20822 Contact with and (suspected) exposure to covid-19: Secondary | ICD-10-CM

## 2019-07-26 ENCOUNTER — Ambulatory Visit (INDEPENDENT_AMBULATORY_CARE_PROVIDER_SITE_OTHER): Payer: BC Managed Care – PPO | Admitting: Medical

## 2019-07-26 ENCOUNTER — Other Ambulatory Visit: Payer: Self-pay

## 2019-07-26 ENCOUNTER — Telehealth: Payer: Self-pay | Admitting: *Deleted

## 2019-07-26 ENCOUNTER — Telehealth: Payer: Self-pay | Admitting: Medical

## 2019-07-26 DIAGNOSIS — U071 COVID-19: Secondary | ICD-10-CM

## 2019-07-26 LAB — NOVEL CORONAVIRUS, NAA: SARS-CoV-2, NAA: DETECTED — AB

## 2019-07-26 MED ORDER — FLUTICASONE PROPIONATE HFA 110 MCG/ACT IN AERO: 2.0000 | INHALATION_SPRAY | Freq: Two times a day (BID) | RESPIRATORY_TRACT | 1 refills | Status: DC

## 2019-07-26 MED ORDER — ALBUTEROL SULFATE HFA 108 (90 BASE) MCG/ACT IN AERS: 2.0000 | INHALATION_SPRAY | Freq: Four times a day (QID) | RESPIRATORY_TRACT | 1 refills | Status: DC | PRN
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
## 2019-07-26 NOTE — Telephone Encounter (Signed)
Will call pt on Monday. She has covid. Will you see how she is doing.

## 2019-07-26 NOTE — Patient Instructions (Addendum)
For some dyspnea recently recommend start flovent daily and use albuterol as needed.   Continue amoxicillin antibiotic. Unfortunately azithromycin can't be given due to probable allergic reaction.  Counseled pt on if signs/symptoms worsen or change then be seen in ED. Explained if she can get 02 sat monitor her sat that would be helpful. For low values as explained then be seen in ED.  Update me on Monday how she is feeling.   Follow up in 7 days or as needed.

## 2019-07-26 NOTE — Telephone Encounter (Signed)
Work note sent to email [email or web address] that Natalie Richard done for today.

## 2019-07-28 ENCOUNTER — Telehealth: Payer: Self-pay | Admitting: Medical

## 2019-07-28 NOTE — Telephone Encounter (Signed)
Pt had positive covid test. Will you contact health dept on positive test result.

## 2019-07-29 NOTE — Telephone Encounter (Signed)
We don't have to send results to the health department anymore

## 2019-07-30 ENCOUNTER — Telehealth: Payer: Self-pay | Admitting: Family Medicine

## 2019-07-30 NOTE — Telephone Encounter (Signed)
Please advise 

## 2019-07-30 NOTE — Telephone Encounter (Signed)
Pt was told to ck back with Dr C on her Covid symptoms, still running fever 99.7, has a terrific headache, nothing helps it, coughing up yellow flem, is wanting to stop the antibiotics as feels not working. Please FU with pt! [phone]

## 2019-07-30 NOTE — Telephone Encounter (Signed)
She tested positive for covid on 10/21 She notes a temp of 99.7 Diarrhea off and on She has a HA She did a virtual visit on 10/23 and was started on amox for possible sinus infection- however her covid is positive so she can stop this Her breathing is better She notes decreased taste and smell and wonders when this will resolve  Lab Results  Component Value Date   HGBA1C 10.9 (H) 10/24/2018   Her husband tells me that she is not taking her insulin She is not eating much and not checking her glucose Advised her to check her glucose- if under 150 or so do not need insulin, but if much higher restart lantus please.  She agrees

## 2019-08-05 ENCOUNTER — Other Ambulatory Visit: Payer: Self-pay | Admitting: Family Medicine

## 2019-08-05 DIAGNOSIS — E785 Hyperlipidemia, unspecified: Secondary | ICD-10-CM

## 2019-08-09 ENCOUNTER — Other Ambulatory Visit: Payer: Self-pay | Admitting: Family Medicine

## 2019-08-09 DIAGNOSIS — K219 Gastro-esophageal reflux disease without esophagitis: Secondary | ICD-10-CM

## 2019-08-24 ENCOUNTER — Other Ambulatory Visit: Payer: Self-pay | Admitting: Family Medicine

## 2019-08-24 DIAGNOSIS — R809 Proteinuria, unspecified: Secondary | ICD-10-CM

## 2019-08-25 ENCOUNTER — Other Ambulatory Visit: Payer: Self-pay | Admitting: Family Medicine

## 2019-08-25 DIAGNOSIS — E1165 Type 2 diabetes mellitus with hyperglycemia: Secondary | ICD-10-CM

## 2019-08-27 ENCOUNTER — Other Ambulatory Visit: Payer: Self-pay | Admitting: Family Medicine

## 2019-08-27 DIAGNOSIS — R809 Proteinuria, unspecified: Secondary | ICD-10-CM

## 2019-08-27 NOTE — Telephone Encounter (Signed)
Medication Refill - Medication: lisinopril-hydrochlorothiazide (ZESTORETIC) 10-12.5 MG tablet [[id number]]   01 day supply per insurance     Preferred Pharmacy (with phone number or street name):  [id number] Phone: [phone]: [phone]    Agent: Please be advised that RX refills may take up to 3 business days. We ask that you follow-up with your pharmacy.

## 2019-08-27 NOTE — Telephone Encounter (Signed)
Requested medication (s) are due for refill today: yes  Requested medication (s) are on the active medication list:yes  Last refill:  11/23/202  Future visit scheduled: no  Notes to clinic:  90 day supply per insurance    Requested Prescriptions  Pending Prescriptions Disp Refills   lisinopril-hydrochlorothiazide (ZESTORETIC) 10-12.5 MG tablet 90 tablet 3     Cardiovascular:  ACEI + Diuretic Combos Failed - 08/27/2019  1:09 PM      Failed - Na in normal range and within 180 days    Sodium  Date Value Ref Range Status  10/24/2018 140 135 - 145 mEq/L Final         Failed - K in normal range and within 180 days    Potassium  Date Value Ref Range Status  10/24/2018 4.9 3.5 - 5.1 mEq/L Final         Failed - Cr in normal range and within 180 days    Creat  Date Value Ref Range Status  04/08/2016 0.76 0.50 - 1.05 mg/dL Final    Comment:      For patients > or = 55 years of age: The upper reference limit for Creatinine is approximately 13% higher for people identified as African-American.      Creatinine, Ser  Date Value Ref Range Status  10/24/2018 0.82 0.40 - 1.20 mg/dL Final         Failed - Ca in normal range and within 180 days    Calcium  Date Value Ref Range Status  10/24/2018 10.1 8.4 - 10.5 mg/dL Final         Passed - Patient is not pregnant      Passed - Last BP in normal range    BP Readings from Last 1 Encounters:  12/13/18 112/80         Passed - Valid encounter within last 6 months    Recent Outpatient Visits          1 month ago COVID-19 virus infection   [facility] at [facility], PA-C   1 month ago Acute non-recurrent frontal sinusitis   [facility]ivist at [place], PA-C   5 months ago [facility], Gay Filler, MD   7 months ago Diarrhea of presumed infectious origin   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

## 2019-12-11 ENCOUNTER — Encounter: Payer: Self-pay | Admitting: Family Medicine

## 2019-12-11 DIAGNOSIS — Z86007 Personal history of in-situ neoplasm of skin: Secondary | ICD-10-CM | POA: Insufficient documentation

## 2020-01-22 ENCOUNTER — Other Ambulatory Visit: Payer: Self-pay

## 2020-01-22 ENCOUNTER — Encounter: Payer: Self-pay | Admitting: Family Medicine

## 2020-01-22 ENCOUNTER — Ambulatory Visit (INDEPENDENT_AMBULATORY_CARE_PROVIDER_SITE_OTHER): Payer: BC Managed Care – PPO | Admitting: Family Medicine

## 2020-01-22 VITALS — BP 108/75 | HR 65 | Temp 97.7°F | Resp 18 | Ht 66.0 in | Wt 217.0 lb

## 2020-01-22 DIAGNOSIS — I10 Essential (primary) hypertension: Secondary | ICD-10-CM | POA: Diagnosis not present

## 2020-01-22 DIAGNOSIS — E782 Mixed hyperlipidemia: Secondary | ICD-10-CM | POA: Diagnosis not present

## 2020-01-22 DIAGNOSIS — Z1329 Encounter for screening for other suspected endocrine disorder: Secondary | ICD-10-CM

## 2020-01-22 DIAGNOSIS — K7581 Nonalcoholic steatohepatitis (NASH): Secondary | ICD-10-CM

## 2020-01-22 DIAGNOSIS — R300 Dysuria: Secondary | ICD-10-CM

## 2020-01-22 DIAGNOSIS — E119 Type 2 diabetes mellitus without complications: Secondary | ICD-10-CM

## 2020-01-22 LAB — POC URINALSYSI DIPSTICK (AUTOMATED)
Bilirubin, UA: NEGATIVE
Blood, UA: NEGATIVE
Glucose, UA: POSITIVE — AB
Leukocytes, UA: NEGATIVE
Nitrite, UA: NEGATIVE
Protein, UA: NEGATIVE
Spec Grav, UA: 1.025 (ref 1.010–1.025)
Urobilinogen, UA: 0.2 E.U./dL
pH, UA: 5.5 (ref 5.0–8.0)

## 2020-01-22 MED ORDER — NITROFURANTOIN MONOHYD MACRO 100 MG PO CAPS: 100.0000 mg | ORAL_CAPSULE | Freq: Two times a day (BID) | ORAL | 0 refills | Status: DC

## 2020-01-22 NOTE — Progress Notes (Addendum)
[facility], [place], [place] [address] [phone]  Date:  01/22/2020   Name:  Natalie Richard   DOB:  November 03, 1965   MRN:  [id number]  PCP:  Darreld Mclean, MD    Chief Complaint: Urinary Tract Infection (burning, hesitancy, urge to urinate)   History of Present Illness:  Natalie Richard is a 54 y.o. very pleasant female patient who presents with the following:  Here today with concern of possible UTI   She has noticed urinary frequency, urgency, bladder discomfort She has noted this for about a week No blood in her urine No fever or chills She has felt a bit off, a bit tired No vomiting, no abdominal or back pain  She had her J&J vaccine just over a month ago   We will do routine labs for her today as she is overdue She is taking 25 units of basaglar but admits she is not consistent with this  She is taking her trulicity weekly  She continues to work at [employer] She has never been a smoker  Lab Results  Component Value Date   HGBA1C 10.6 (H) 01/22/2020     Patient Active Problem List   Diagnosis Date Noted  . History of squamous cell carcinoma in situ (SCCIS) of skin 12/11/2019  . NASH (nonalcoholic steatohepatitis) 07/26/2015  . BMI 39.0-39.9,adult 03/19/2012  . Hematoma 03/19/2012  . HTN (hypertension), benign 01/09/2012  . Osteopenia 01/09/2012  . Migraines 01/09/2012  . Insomnia 01/09/2012  . Anxiety and depression 01/09/2012  . Hyperlipidemia 01/09/2012  . Diabetes mellitus 01/09/2012  . Allergic rhinitis 01/09/2012    Past Medical History:  Diagnosis Date  . Anxiety   . 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 Depression   . Diabetes mellitus   . Diabetes mellitus, type II ([place])   . GERD (gastroesophageal reflux disease)   . Hematoma    on buttock  . Hyperlipidemia   . Hypertension   . Migraine   . Seasonal allergies     Past  Surgical History:  Procedure Laterality Date  . ABDOMINAL HYSTERECTOMY  2004   PCOS  . EYE SURGERY    . INNER EAR SURGERY Left 08/2016    Social History   Tobacco Use  . Smoking status: Never Smoker  . Smokeless tobacco: Never Used  Substance Use Topics  . Alcohol use: Yes    Alcohol/week: 8.0 - 10.0 standard drinks    Types: 6 Cans of beer, 2 Shots of liquor per week    Comment: weekly  . Drug use: No    Family History  Problem Relation Age of Onset  . Cancer Mother 70       colon  . Diabetes Mother   . Rheum arthritis Mother   . Dementia Mother   . Depression Mother   . Cancer Father 13       bone and prostate  . Cancer Maternal Grandmother        breast  . Diabetes Maternal Grandfather   . Depression Cousin     Allergies  Allergen Reactions  . Buspar [Buspirone] Other (See Comments)    Lip swelling and rash numbness   . Augmentin [Amoxicillin-Pot Clavulanate] Nausea And Vomiting  . Biaxin [Clarithromycin]   . Ceftin [Cefuroxime] Swelling    Eye swelling, shortness of breath   . Ciprofloxacin  Developed itching with IV form in the hospital, but has since taken PO without a problem  . Dapagliflozin Other (See Comments)    Burning upon urination. Increased blood sugar.  . Doxycycline Itching  . Levaquin [Levofloxacin] Hives  . Metformin And Related Nausea And Vomiting  . Phenazopyridine Hcl Other (See Comments)    Does not remember reaction  . Pyridium [Phenazopyridine Hcl]     Does not remember reaction  . Sulfa Antibiotics     As a younger person had itching, but has taken more recently and did ok  . Latex Rash  . Lorabid [Loracarbef] Rash    Medication list has been reviewed and updated.  Current Outpatient Medications on File Prior to Visit  Medication Sig Dispense Refill  . ARIPiprazole (ABILIFY) 5 MG tablet Take 2.5 mg by mouth daily.    . BD PEN NEEDLE NANO U/F 32G X 4 MM MISC USE AS DIRECTED 100 each 3  . benzonatate (TESSALON) 100 MG  capsule Take 1 capsule (100 mg total) by mouth 3 (three) times daily as needed for cough. 30 capsule 0  . DEXILANT 60 MG capsule TAKE 1 CAPSULE BY MOUTH EVERY DAY 90 capsule 1  . Dulaglutide (TRULICITY) 1.5 MV/7.8IO SOPN Inject 1.5 mg per week for diabetes 12 pen 2  . fluticasone (FLONASE) 50 MCG/ACT nasal spray as needed.    . folic acid (FOLVITE) 1 MG tablet TAKE 1 TABLET BY MOUTH EVERY DAY 90 tablet 3  . glucose blood test strip Use to check glucose 1-2x daily 100 each 12  . Insulin Glargine (BASAGLAR KWIKPEN) 100 UNIT/ML SOPN Start with 10 units daily, increase as directed by MD.  Estimate final dose will be 25u daily 45 mL 3  . lisinopril-hydrochlorothiazide (ZESTORETIC) 10-12.5 MG tablet TAKE 1/2 TABLET ONCE A DAY. MAY INCREASE TO WHOLE TABLET IF DIRECTED BY MD 90 tablet 3  . loratadine (CLARITIN) 10 MG tablet Take 1 tablet (10 mg total) by mouth daily. 90 tablet 3  . Multiple Vitamins-Minerals (MULTIVITAMIN WITH MINERALS) tablet Take 1 tablet by mouth daily.    . pioglitazone (ACTOS) 30 MG tablet Take 1 tablet (30 mg total) by mouth daily. 90 tablet 3  . rosuvastatin (CRESTOR) 40 MG tablet TAKE 1 TABLET BY MOUTH EVERY DAY 90 tablet 1  . citalopram (CELEXA) 40 MG tablet Take 1 tablet (40 mg total) by mouth daily. 90 tablet 0  . clonazePAM (KLONOPIN) 0.5 MG tablet Take 1 tablet (0.5 mg total) by mouth 2 (two) times daily. (Patient taking differently: Take 0.5 mg by mouth daily. ) 180 tablet 0   No current facility-administered medications on file prior to visit.    Review of Systems:  As per HPI- otherwise negative.   Physical Examination: Vitals:   01/22/20 1401  BP: 108/75  Pulse: 65  Resp: 18  Temp: 97.7 F (36.5 C)  SpO2: 98%   Vitals:   01/22/20 1401  Weight: 217 lb (98.4 kg)  Height: 5' 6"  (1.676 m)   Body mass index is 35.02 kg/m. Ideal Body Weight: Weight in (lb) to have BMI = 25: 154.6  GEN: no acute distress.  Obese, otherwise looks well HEENT: Atraumatic,  Normocephalic.  Ears and Nose: No external deformity. CV: RRR, No M/G/R. No JVD. No thrill. No extra heart sounds. PULM: CTA B, no wheezes, crackles, rhonchi. No retractions. No resp. distress. No accessory muscle use. ABD: S, NT, ND, +BS. No rebound. No HSM. EXTR: No c/c/e PSYCH: Normally interactive. Conversant.  No CVA tenderness  Belly is benign  Results for orders placed or performed in visit on 01/22/20  Urine Culture   Specimen: Urine  Result Value Ref Range   MICRO NUMBER: [id number]    SPECIMEN QUALITY: Adequate    Sample Source NOT GIVEN    STATUS: FINAL    ISOLATE 1:      Growth of mixed flora was isolated, suggesting probable contamination. No further testing will be performed. If clinically indicated, recollection using a method to minimize contamination, with prompt transfer to Urine Culture Transport Tube, is  recommended.   CBC  Result Value Ref Range   WBC 7.8 4.0 - 10.5 K/uL   RBC 5.05 3.87 - 5.11 Mil/uL   Platelets 304.0 150.0 - 400.0 K/uL   Hemoglobin 14.2 12.0 - 15.0 g/dL   HCT 43.2 36.0 - 46.0 %   MCV 85.5 78.0 - 100.0 fl   MCHC 32.9 30.0 - 36.0 g/dL   RDW 14.0 11.5 - 15.5 %  Comprehensive metabolic panel  Result Value Ref Range   Sodium 135 135 - 145 mEq/L   Potassium 4.0 3.5 - 5.1 mEq/L   Chloride 97 96 - 112 mEq/L   CO2 27 19 - 32 mEq/L   Glucose, Bld 286 (H) 70 - 99 mg/dL   BUN 11 6 - 23 mg/dL   Creatinine, Ser 0.79 0.40 - 1.20 mg/dL   Total Bilirubin 0.3 0.2 - 1.2 mg/dL   Alkaline Phosphatase 144 (H) 39 - 117 U/L   AST 88 (H) 0 - 37 U/L   ALT 80 (H) 0 - 35 U/L   Total Protein 6.8 6.0 - 8.3 g/dL   Albumin 4.0 3.5 - 5.2 g/dL   GFR 75.41 >60.00 mL/min   Calcium 9.3 8.4 - 10.5 mg/dL  Hemoglobin A1c  Result Value Ref Range   Hgb A1c MFr Bld 10.6 (H) 4.6 - 6.5 %  Lipid panel  Result Value Ref Range   Cholesterol 243 (H) 0 - 200 mg/dL   Triglycerides 250.0 (H) 0.0 - 149.0 mg/dL   HDL 53.70 >39.00 mg/dL   VLDL 50.0 (H) 0.0 - 40.0 mg/dL    Total CHOL/HDL Ratio 5    NonHDL 189.30   TSH  Result Value Ref Range   TSH 1.55 0.35 - 4.50 uIU/mL  Microalbumin / creatinine urine ratio  Result Value Ref Range   Microalb, Ur <0.7 0.0 - 1.9 mg/dL   Creatinine,U 77.7 mg/dL   Microalb Creat Ratio 0.9 0.0 - 30.0 mg/g  LDL cholesterol, direct  Result Value Ref Range   Direct LDL 165.0 mg/dL  POCT Urinalysis Dipstick (Automated)  Result Value Ref Range   Color, UA yellow    Clarity, UA clear    Glucose, UA Positive (A) Negative   Bilirubin, UA negative    Ketones, UA trace    Spec Grav, UA 1.025 1.010 - 1.025   Blood, UA negative    pH, UA 5.5 5.0 - 8.0   Protein, UA Negative Negative   Urobilinogen, UA 0.2 0.2 or 1.0 E.U./dL   Nitrite, UA negative    Leukocytes, UA Negative Negative     Assessment and Plan: Dysuria - Plan: POCT Urinalysis Dipstick (Automated), Urine Culture, nitrofurantoin, macrocrystal-monohydrate, (MACROBID) 100 MG capsule  Type 2 diabetes mellitus without complication, without long-term current use of insulin (HCC) - Plan: Comprehensive metabolic panel, Hemoglobin A1c, Microalbumin / creatinine urine ratio  NASH (nonalcoholic steatohepatitis) - Plan: Comprehensive metabolic panel  Mixed hyperlipidemia - Plan: Lipid  panel  HTN (hypertension), benign - Plan: CBC  Screening for thyroid disorder - Plan: TSH  Here today for possible UTI.  Patient has noticed symptoms fairly classic for UTI for the last week or so.  She does note that in the past she required a urethral dilation per urology, we hope that this will not be necessary at this time She is asked to let me know if not feeling better in the next 1 to 2 days, sooner if getting worse We will start her on Macrobid Urine culture pending  Routine labs are overdue, check today She has had her COVID-19 vaccine Moderate medical decision making today. This visit occurred during the SARS-CoV-2 public health emergency.  Safety protocols were in place,  including screening questions prior to the visit, additional usage of staff PPE, and extensive cleaning of exam room while observing appropriate contact time as indicated for disinfecting solutions.   Signed Lamar Blinks, MD  Addendum 4/22, received her labs as below A1c was 10.9 at last visit LFTs continue to be elevated Cholesterol high  Letter to patient, she does not have my chart Results for orders placed or performed in visit on 01/22/20  Urine Culture   Specimen: Urine  Result Value Ref Range   MICRO NUMBER: [id number]    SPECIMEN QUALITY: Adequate    Sample Source NOT GIVEN    STATUS: FINAL    ISOLATE 1:      Growth of mixed flora was isolated, suggesting probable contamination. No further testing will be performed. If clinically indicated, recollection using a method to minimize contamination, with prompt transfer to Urine Culture Transport Tube, is  recommended.   CBC  Result Value Ref Range   WBC 7.8 4.0 - 10.5 K/uL   RBC 5.05 3.87 - 5.11 Mil/uL   Platelets 304.0 150.0 - 400.0 K/uL   Hemoglobin 14.2 12.0 - 15.0 g/dL   HCT 43.2 36.0 - 46.0 %   MCV 85.5 78.0 - 100.0 fl   MCHC 32.9 30.0 - 36.0 g/dL   RDW 14.0 11.5 - 15.5 %  Comprehensive metabolic panel  Result Value Ref Range   Sodium 135 135 - 145 mEq/L   Potassium 4.0 3.5 - 5.1 mEq/L   Chloride 97 96 - 112 mEq/L   CO2 27 19 - 32 mEq/L   Glucose, Bld 286 (H) 70 - 99 mg/dL   BUN 11 6 - 23 mg/dL   Creatinine, Ser 0.79 0.40 - 1.20 mg/dL   Total Bilirubin 0.3 0.2 - 1.2 mg/dL   Alkaline Phosphatase 144 (H) 39 - 117 U/L   AST 88 (H) 0 - 37 U/L   ALT 80 (H) 0 - 35 U/L   Total Protein 6.8 6.0 - 8.3 g/dL   Albumin 4.0 3.5 - 5.2 g/dL   GFR 75.41 >60.00 mL/min   Calcium 9.3 8.4 - 10.5 mg/dL  Hemoglobin A1c  Result Value Ref Range   Hgb A1c MFr Bld 10.6 (H) 4.6 - 6.5 %  Lipid panel  Result Value Ref Range   Cholesterol 243 (H) 0 - 200 mg/dL   Triglycerides 250.0 (H) 0.0 - 149.0 mg/dL   HDL 53.70 >39.00 mg/dL    VLDL 50.0 (H) 0.0 - 40.0 mg/dL   Total CHOL/HDL Ratio 5    NonHDL 189.30   TSH  Result Value Ref Range   TSH 1.55 0.35 - 4.50 uIU/mL  Microalbumin / creatinine urine ratio  Result Value Ref Range   Microalb, Ur <0.7 0.0 - 1.9  mg/dL   Creatinine,U 77.7 mg/dL   Microalb Creat Ratio 0.9 0.0 - 30.0 mg/g  LDL cholesterol, direct  Result Value Ref Range   Direct LDL 165.0 mg/dL  POCT Urinalysis Dipstick (Automated)  Result Value Ref Range   Color, UA yellow    Clarity, UA clear    Glucose, UA Positive (A) Negative   Bilirubin, UA negative    Ketones, UA trace    Spec Grav, UA 1.025 1.010 - 1.025   Blood, UA negative    pH, UA 5.5 5.0 - 8.0   Protein, UA Negative Negative   Urobilinogen, UA 0.2 0.2 or 1.0 E.U./dL   Nitrite, UA negative    Leukocytes, UA Negative Negative    addnd 4/23-call patient with update.  Urine culture negative -she is continuing to have urethral discomfort.  She [facility] appoint with [facility] to be seen within the next couple of weeks, she may need a urethral dilation.  Have asked [place] to fax over notes, will ask my nurse to take care of this  Her GI is Dr Jeb Levering sees her annually, should see her in the next month or 2.  I have asked Natalie Richard to please bring a copy of her labs with her which she sees Dr. Collene Mares sweats to discuss her liver function

## 2020-01-22 NOTE — Patient Instructions (Signed)
Good to see you today- I will be in touch with your labs asap We will treat you for presumed UTI with macrobid twice a day for one week- I will be in touch with your urine culture  Let me know if you are getting worse

## 2020-01-23 LAB — CBC
HCT: 43.2 % (ref 36.0–46.0)
Hemoglobin: 14.2 g/dL (ref 12.0–15.0)
MCHC: 32.9 g/dL (ref 30.0–36.0)
MCV: 85.5 fl (ref 78.0–100.0)
Platelets: 304 10*3/uL (ref 150.0–400.0)
RBC: 5.05 Mil/uL (ref 3.87–5.11)
RDW: 14 % (ref 11.5–15.5)
WBC: 7.8 10*3/uL (ref 4.0–10.5)

## 2020-01-23 LAB — COMPREHENSIVE METABOLIC PANEL
ALT: 80 U/L — ABNORMAL HIGH (ref 0–35)
AST: 88 U/L — ABNORMAL HIGH (ref 0–37)
Albumin: 4 g/dL (ref 3.5–5.2)
Alkaline Phosphatase: 144 U/L — ABNORMAL HIGH (ref 39–117)
BUN: 11 mg/dL (ref 6–23)
CO2: 27 mEq/L (ref 19–32)
Calcium: 9.3 mg/dL (ref 8.4–10.5)
Chloride: 97 mEq/L (ref 96–112)
Creatinine, Ser: 0.79 mg/dL (ref 0.40–1.20)
GFR: 75.41 mL/min (ref >60.00)
Glucose, Bld: 286 mg/dL — ABNORMAL HIGH (ref 70–99)
Potassium: 4 mEq/L (ref 3.5–5.1)
Sodium: 135 mEq/L (ref 135–145)
Total Bilirubin: 0.3 mg/dL (ref 0.2–1.2)
Total Protein: 6.8 g/dL (ref 6.0–8.3)

## 2020-01-23 LAB — URINE CULTURE
MICRO NUMBER:: 10389752
SPECIMEN QUALITY:: ADEQUATE

## 2020-01-23 LAB — MICROALBUMIN / CREATININE URINE RATIO
Creatinine,U: 77.7 mg/dL
Microalb Creat Ratio: 0.9 mg/g (ref 0.0–30.0)
Microalb, Ur: <0.7 mg/dL (ref 0.0–1.9)

## 2020-01-23 LAB — LDL CHOLESTEROL, DIRECT: Direct LDL: 165 mg/dL

## 2020-01-23 LAB — LIPID PANEL
Cholesterol: 243 mg/dL — ABNORMAL HIGH (ref 0–200)
HDL: 53.7 mg/dL (ref >39.00)
NonHDL: 189.3
Total CHOL/HDL Ratio: 5
Triglycerides: 250 mg/dL — ABNORMAL HIGH (ref 0.0–149.0)
VLDL: 50 mg/dL — ABNORMAL HIGH (ref 0.0–40.0)

## 2020-01-23 LAB — TSH: TSH: 1.55 u[IU]/mL (ref 0.35–4.50)

## 2020-01-23 LAB — HEMOGLOBIN A1C: Hgb A1c MFr Bld: 10.6 % — ABNORMAL HIGH (ref 4.6–6.5)

## 2020-01-24 ENCOUNTER — Telehealth: Payer: Self-pay | Admitting: Family Medicine

## 2020-01-24 NOTE — Telephone Encounter (Signed)
Please fax urine results to [facility]. Patient has an appointment Monday Morning.   [facility]  [place] [address] [place], [place]   Location Hours:  8:00 a. m. - 5:00 p. m.  [phone]           Patient advise

## 2020-01-24 NOTE — Telephone Encounter (Signed)
Information has been faxed.

## 2020-02-07 ENCOUNTER — Other Ambulatory Visit: Payer: Self-pay | Admitting: Family Medicine

## 2020-02-07 DIAGNOSIS — K219 Gastro-esophageal reflux disease without esophagitis: Secondary | ICD-10-CM

## 2020-02-26 ENCOUNTER — Other Ambulatory Visit: Payer: Self-pay | Admitting: Family Medicine

## 2020-02-26 DIAGNOSIS — E1165 Type 2 diabetes mellitus with hyperglycemia: Secondary | ICD-10-CM

## 2020-02-29 ENCOUNTER — Other Ambulatory Visit: Payer: Self-pay | Admitting: Family Medicine

## 2020-02-29 DIAGNOSIS — E1165 Type 2 diabetes mellitus with hyperglycemia: Secondary | ICD-10-CM

## 2020-04-02 ENCOUNTER — Emergency Department (HOSPITAL_BASED_OUTPATIENT_CLINIC_OR_DEPARTMENT_OTHER): Payer: BC Managed Care – PPO

## 2020-04-02 ENCOUNTER — Encounter (HOSPITAL_BASED_OUTPATIENT_CLINIC_OR_DEPARTMENT_OTHER): Payer: Self-pay | Admitting: *Deleted

## 2020-04-02 ENCOUNTER — Other Ambulatory Visit: Payer: Self-pay

## 2020-04-02 ENCOUNTER — Emergency Department (HOSPITAL_BASED_OUTPATIENT_CLINIC_OR_DEPARTMENT_OTHER)
Admission: EM | Admit: 2020-04-02 | Discharge: 2020-04-02 | Disposition: A | Discharge status: Home / Self Care | Payer: BC Managed Care – PPO | Attending: Emergency Medicine | Admitting: Emergency Medicine

## 2020-04-02 ENCOUNTER — Encounter: Payer: Self-pay | Admitting: Family Medicine

## 2020-04-02 ENCOUNTER — Ambulatory Visit (INDEPENDENT_AMBULATORY_CARE_PROVIDER_SITE_OTHER): Payer: BC Managed Care – PPO | Admitting: Family Medicine

## 2020-04-02 VITALS — Resp 17 | Ht 66.0 in | Wt 211.0 lb

## 2020-04-02 DIAGNOSIS — E119 Type 2 diabetes mellitus without complications: Secondary | ICD-10-CM | POA: Diagnosis not present

## 2020-04-02 DIAGNOSIS — R5383 Other fatigue: Secondary | ICD-10-CM | POA: Diagnosis not present

## 2020-04-02 DIAGNOSIS — R0789 Other chest pain: Secondary | ICD-10-CM | POA: Diagnosis not present

## 2020-04-02 DIAGNOSIS — R0602 Shortness of breath: Secondary | ICD-10-CM | POA: Insufficient documentation

## 2020-04-02 DIAGNOSIS — Z79899 Other long term (current) drug therapy: Secondary | ICD-10-CM | POA: Diagnosis not present

## 2020-04-02 DIAGNOSIS — I10 Essential (primary) hypertension: Secondary | ICD-10-CM | POA: Diagnosis not present

## 2020-04-02 DIAGNOSIS — Z91040 Latex allergy status: Secondary | ICD-10-CM | POA: Diagnosis not present

## 2020-04-02 DIAGNOSIS — R110 Nausea: Secondary | ICD-10-CM | POA: Diagnosis not present

## 2020-04-02 DIAGNOSIS — R42 Dizziness and giddiness: Secondary | ICD-10-CM | POA: Insufficient documentation

## 2020-04-02 DIAGNOSIS — Z794 Long term (current) use of insulin: Secondary | ICD-10-CM | POA: Insufficient documentation

## 2020-04-02 DIAGNOSIS — K7581 Nonalcoholic steatohepatitis (NASH): Secondary | ICD-10-CM | POA: Diagnosis not present

## 2020-04-02 DIAGNOSIS — E782 Mixed hyperlipidemia: Secondary | ICD-10-CM | POA: Diagnosis not present

## 2020-04-02 DIAGNOSIS — R079 Chest pain, unspecified: Secondary | ICD-10-CM

## 2020-04-02 LAB — COMPREHENSIVE METABOLIC PANEL
ALT: 119 U/L — ABNORMAL HIGH (ref 0–44)
AST: 141 U/L — ABNORMAL HIGH (ref 15–41)
Albumin: 4.1 g/dL (ref 3.5–5.0)
Alkaline Phosphatase: 150 U/L — ABNORMAL HIGH (ref 38–126)
Anion gap: 14 (ref 5–15)
BUN: 12 mg/dL (ref 6–20)
CO2: 22 mmol/L (ref 22–32)
Calcium: 9 mg/dL (ref 8.9–10.3)
Chloride: 97 mmol/L — ABNORMAL LOW (ref 98–111)
Creatinine, Ser: 0.78 mg/dL (ref 0.44–1.00)
GFR calc Af Amer: >60 mL/min (ref >60)
GFR calc non Af Amer: >60 mL/min (ref >60)
Glucose, Bld: 291 mg/dL — ABNORMAL HIGH (ref 70–99)
Potassium: 3.7 mmol/L (ref 3.5–5.1)
Sodium: 133 mmol/L — ABNORMAL LOW (ref 135–145)
Total Bilirubin: 0.8 mg/dL (ref 0.3–1.2)
Total Protein: 8 g/dL (ref 6.5–8.1)

## 2020-04-02 LAB — CBC WITH DIFFERENTIAL/PLATELET
Abs Immature Granulocytes: 0.03 10*3/uL (ref 0.00–0.07)
Basophils Absolute: 0 10*3/uL (ref 0.0–0.1)
Basophils Relative: 1 %
Eosinophils Absolute: 0.1 10*3/uL (ref 0.0–0.5)
Eosinophils Relative: 1 %
HCT: 46.8 % — ABNORMAL HIGH (ref 36.0–46.0)
Hemoglobin: 15.6 g/dL — ABNORMAL HIGH (ref 12.0–15.0)
Immature Granulocytes: 0 %
Lymphocytes Relative: 34 %
Lymphs Abs: 2.9 10*3/uL (ref 0.7–4.0)
MCH: 28.5 pg (ref 26.0–34.0)
MCHC: 33.3 g/dL (ref 30.0–36.0)
MCV: 85.4 fL (ref 80.0–100.0)
Monocytes Absolute: 0.5 10*3/uL (ref 0.1–1.0)
Monocytes Relative: 6 %
Neutro Abs: 4.9 10*3/uL (ref 1.7–7.7)
Neutrophils Relative %: 58 %
Platelets: 323 10*3/uL (ref 150–400)
RBC: 5.48 MIL/uL — ABNORMAL HIGH (ref 3.87–5.11)
RDW: 13 % (ref 11.5–15.5)
WBC: 8.4 10*3/uL (ref 4.0–10.5)
nRBC: 0 % (ref 0.0–0.2)

## 2020-04-02 LAB — TSH: TSH: 2.698 u[IU]/mL (ref 0.350–4.500)

## 2020-04-02 LAB — GLUCOSE, POCT (MANUAL RESULT ENTRY): POC Glucose: 274 mg/dl — AB (ref 70–99)

## 2020-04-02 LAB — TROPONIN I (HIGH SENSITIVITY)
Troponin I (High Sensitivity): <2 ng/L (ref <18)
Troponin I (High Sensitivity): 2 ng/L (ref <18)

## 2020-04-02 MED ORDER — ASPIRIN 81 MG PO CHEW: 81.0000 mg | CHEWABLE_TABLET | Freq: Every day | ORAL | 0 refills | Status: DC

## 2020-04-02 NOTE — ED Provider Notes (Addendum)
Medical screening examination/treatment/procedure(s) were conducted as a shared visit with non-physician practitioner(s) and myself.  I personally evaluated the patient during the encounter.  EKG Interpretation  Date/Time:  Thursday April 02 2020 12:30:09 EDT Ventricular Rate:  83 PR Interval:    QRS Duration: 71 QT Interval:  436 QTC Calculation: 513 R Axis:   -10 Text Interpretation: Sinus rhythm Abnormal R-wave progression, early transition Borderline T abnormalities, anterior leads Prolonged QT interval No significant change since last tracing Confirmed by Fredia Sorrow [phone]) on 04/02/2020 12:33:25 PM     Patient sent down from primary care doctor upstairs Dr.Copland for intermittent chest pain since Memorial Day weekend.  Patient states that the pain is squeezing in nature and sort of left substernal area last for 2 to 3 minutes.  Happens frequently throughout the day no shortness of breath no leg swelling.  She has past medical history significant for diabetes hyperlipidemia and hypertension.  And diabetes type 2 no known cardiac disease.  Work-up here initial troponin normal chest x-ray negative EKG without any acute changes or any significant changes from past.  If second troponin is negative patient safe for discharge home but would recommend starting a baby aspirin a day and follow-up with cardiology for further work-up.  As well patient knows to return for any new or worse symptoms particularly if the chest pain starts to last longer.   Fredia Sorrow, MD 04/02/20 1529    Fredia Sorrow, MD 04/02/20 1530

## 2020-04-02 NOTE — Progress Notes (Signed)
Natalie Richard at [facility] [place], [name], [name] [phone]  Date:  04/02/2020   Name:  Natalie Richard   DOB:  02/12/1966   MRN:  [id number]  PCP:  Natalie Mclean, MD    Chief Complaint: Fatigue (feels sluggish, headache, shortness of breath when walking) and Lab Work (possible deficient in vitamin level)   History of Present Illness:  Natalie Richard is a 54 y.o. very pleasant female patient who presents with the following:  Patient with history of hypertension, uncontrolled diabetes, hyperlipidemia, obesity, NASH here today with concern of dizziness I last saw her in April of this year with concern of possible UTI She has completed her COVID-19 vaccine  She has noted a feeling of fatigue, sluggish, dizzy described as both lightheaded and vertigo, nausea- she has noted these sx off and on for about 5-6 weeks.  Sx will come and go but more persistent the last 48 hours No vomiting Her appetite is not as good as normal but she is eating. Not starving herself in any way She could not work yesterday or today due to her symptoms  When she is at work she may feel SOB when she is standing and talking for a long time.  She may feel clammy, hot and sweaty.  She may feel SOB other times as well  Asked about CP - she "does not know" but describes a feeling of "something" in her chest that is uncomfortable, does not follow any particular pattern  Her last episode of more severe SOB/ lightheadedness was yesterday when she was in a hot shower She is not really checking her glucose- not sure if she is going too low at times No dysuria or hematuria, no urinary frequency   Status post hysterectomy Eye exam Needs foot exam- complete today  Mammogram Tetanus booster Colon cancer screening is up-to-date  At her last visit her A1c is still quite high at 10.6-actually an improvement over previous since she started on  insulin glargine She is now using 26 units of lantus daily  She is not doing any glucose checks at home   Can recheck A1c today although not quite 90 days No fever. No diarrhea   Lab Results  Component Value Date   HGBA1C 10.6 (H) 01/22/2020    Patient Active Problem List   Diagnosis Date Noted   History of squamous cell carcinoma in situ (SCCIS) of skin 12/11/2019   NASH (nonalcoholic steatohepatitis) 07/26/2015   BMI 39.0-39.9,adult 03/19/2012   Hematoma 03/19/2012   HTN (hypertension), benign 01/09/2012   Osteopenia 01/09/2012   Migraines 01/09/2012   Insomnia 01/09/2012   Anxiety and depression 01/09/2012   Hyperlipidemia 01/09/2012   Diabetes mellitus 01/09/2012   Allergic rhinitis 01/09/2012    Past Medical History:  Diagnosis Date   Anxiety    Cancer ([place])    skin- Right shin squamous cell   Depression    Diabetes mellitus    Diabetes mellitus, type II ([place])    GERD (gastroesophageal reflux disease)    Hematoma    on buttock   Hyperlipidemia    Hypertension    Migraine    Seasonal allergies     Past Surgical History:  Procedure Laterality Date   ABDOMINAL HYSTERECTOMY  2004   PCOS   EYE SURGERY     INNER EAR SURGERY Left 08/2016    Social History   Tobacco Use   Smoking  status: Never Smoker   Smokeless tobacco: Never Used  Vaping Use   Vaping Use: Never used  Substance Use Topics   Alcohol use: Yes    Alcohol/week: 8.0 - 10.0 standard drinks    Types: 6 Cans of beer, 2 Shots of liquor per week    Comment: weekly   Drug use: No    Family History  Problem Relation Age of Onset   Cancer Mother 76       colon   Diabetes Mother    Rheum arthritis Mother    Dementia Mother    Depression Mother    Cancer Father 42       bone and prostate   Cancer Maternal Grandmother        breast   Diabetes Maternal Grandfather    Depression Cousin     Allergies  Allergen Reactions   Buspar [Buspirone]  Other (See Comments)    Lip swelling and rash numbness    Augmentin [Amoxicillin-Pot Clavulanate] Nausea And Vomiting   Biaxin [Clarithromycin]    Ceftin [Cefuroxime] Swelling    Eye swelling, shortness of breath    Ciprofloxacin     Developed itching with IV form in the hospital, but has since taken PO without a problem   Dapagliflozin Other (See Comments)    Burning upon urination. Increased blood sugar.   Doxycycline Itching   Levaquin [Levofloxacin] Hives   Metformin And Related Nausea And Vomiting   Phenazopyridine Hcl Other (See Comments)    Does not remember reaction   Pyridium [Phenazopyridine Hcl]     Does not remember reaction   Sulfa Antibiotics     As a younger person had itching, but has taken more recently and did ok   Latex Rash   Lorabid [Loracarbef] Rash    Medication list has been reviewed and updated.  Current Outpatient Medications on File Prior to Visit  Medication Sig Dispense Refill   ARIPiprazole (ABILIFY) 5 MG tablet Take 2.5 mg by mouth daily.     BD PEN NEEDLE NANO U/F 32G X 4 MM MISC USE AS DIRECTED 100 each 3   DEXILANT 60 MG capsule TAKE 1 CAPSULE BY MOUTH EVERY DAY 90 capsule 1   glucose blood test strip Use to check glucose 1-2x daily 100 each 12   Insulin Glargine (BASAGLAR KWIKPEN) 100 UNIT/ML START WITH 10 UNITS DAILY, INCREASE AS DIRECTED BY MD. ESTIMATE FINAL DOSE WILL BE 25 UNITS DAILY 24 mL 7   lisinopril-hydrochlorothiazide (ZESTORETIC) 10-12.5 MG tablet TAKE 1/2 TABLET ONCE A DAY. MAY INCREASE TO WHOLE TABLET IF DIRECTED BY MD 90 tablet 3   Multiple Vitamins-Minerals (MULTIVITAMIN WITH MINERALS) tablet Take 1 tablet by mouth daily.     pioglitazone (ACTOS) 30 MG tablet Take 1 tablet (30 mg total) by mouth daily. 90 tablet 3   rosuvastatin (CRESTOR) 40 MG tablet TAKE 1 TABLET BY MOUTH EVERY DAY 90 tablet 1   TRULICITY 1.5 YS/0.6TK SOPN INJECT 1.5 MG PER WEEK FOR DIABETES 6 pen 2   citalopram (CELEXA) 40 MG tablet  Take 1 tablet (40 mg total) by mouth daily. 90 tablet 0   clonazePAM (KLONOPIN) 0.5 MG tablet Take 1 tablet (0.5 mg total) by mouth 2 (two) times daily. (Patient taking differently: Take 0.5 mg by mouth daily. ) 180 tablet 0   No current facility-administered medications on file prior to visit.    Review of Systems:  As per HPI- otherwise negative.   Physical Examination: Vitals:   04/02/20 1131  Resp: 17  SpO2: 95%   Vitals:   04/02/20 1131  Weight: 211 lb (95.7 kg)  Height: 5' 6"  (1.676 m)   Body mass index is 34.06 kg/m. Ideal Body Weight: Weight in (lb) to have BMI = 25: 154.6  GEN: no acute distress. HEENT: Atraumatic, Normocephalic.  Ears and Nose: No external deformity. CV: RRR, No M/G/R. No JVD. No thrill. No extra heart sounds. PULM: CTA B, no wheezes, crackles, rhonchi. No retractions. No resp. distress. No accessory muscle use. ABD: S, NT, ND, +BS. No rebound. No HSM. EXTR: No c/c/e PSYCH: Normally interactive. Conversant.   EKG:SR with non specific T wave abnl C/w tracing from 2019 appears unchanged  See orthostatic VS  Results for orders placed or performed in visit on 04/02/20  POCT glucose (manual entry)  Result Value Ref Range   POC Glucose 274 (A) 70 - 99 mg/dl    Assessment and Plan: Mixed hyperlipidemia  Type 2 diabetes mellitus without complication, without long-term current use of insulin (HCC) - Plan: Comprehensive metabolic panel, Hemoglobin A1c  HTN (hypertension), benign - Plan: CBC, Comprehensive metabolic panel  NASH (nonalcoholic steatohepatitis) - Plan: Comprehensive metabolic panel  Lightheaded - Plan: POCT glucose (manual entry), EKG 12-Lead  Pt seen today with vague malaise, dizziness, SOB and chest discomfort.  I had suspected possible hypoglycemia but seems less likely with her current spot glucose of 275.  Encouraged her to start checking her glucose when she is feeling poorly.  She is used to high glucose so high normal  glucose may cause symptoms in her Discussed options with pt- she elects to be seen in the ED today for further evaluation of her symptoms. Will see her for follow-up assuming she is able to be discharged to home  This visit occurred during the SARS-CoV-2 public health emergency.  Safety protocols were in place, including screening questions prior to the visit, additional usage of staff PPE, and extensive cleaning of exam room while observing appropriate contact time as indicated for disinfecting solutions.    Signed Lamar Blinks, MD

## 2020-04-02 NOTE — ED Notes (Signed)
ED Provider at bedside. 

## 2020-04-02 NOTE — Discharge Instructions (Addendum)
You were seen in the emergency department today for chest pain. Your work-up in the emergency department has been overall reassuring. Your labs have been fairly normal and or similar to previous blood work you have had done-your liver function tests were elevated, please continue to have this followed by your primary care provider.  Your EKG and the enzyme we use to check your heart did not show an acute heart attack at this time. Your chest x-ray was normal.  We are starting you on a baby aspirin to take once per day. We have prescribed you new medication(s) today. Discuss the medications prescribed today with your pharmacist as they can have adverse effects and interactions with your other medicines including over the counter and prescribed medications. Seek medical evaluation if you start to experience new or abnormal symptoms after taking one of these medicines, seek care immediately if you start to experience difficulty breathing, feeling of your throat closing, facial swelling, or rash as these could be indications of a more serious allergic reaction   We would like you to follow up closely with your primary care provider and/or the cardiologist provided in your discharge instructions within 1-3 days. Return to the ER immediately should you experience any new or worsening symptoms including but not limited to return of pain, worsened pain, persistent pain, vomiting, shortness of breath, dizziness, lightheadedness, passing out, or any other concerns that you may have.

## 2020-04-02 NOTE — ED Triage Notes (Signed)
Chest pain and shortness of breath since [facility].  Chest pain and shortness of breath comes and goes.

## 2020-04-02 NOTE — Patient Instructions (Signed)
It was good to see you again today!   

## 2020-04-02 NOTE — ED Notes (Signed)
Patient transported to X-ray 

## 2020-04-02 NOTE — ED Provider Notes (Signed)
[facility] EMERGENCY DEPARTMENT Provider Note   CSN: [id number] Arrival date & time: 04/02/20  1217     History Chief Complaint  Patient presents with  . Chest Pain  . Shortness of Breath    Natalie Richard is a 54 y.o. female with a history of hypertension, hyperlipidemia, T2DM, anxiety, and depression who presents to the emergency department from her PCPs office for evaluation of fatigue and chest pain intermittently for the past 2 months.  Patient primarily complaining of intermittent fatigue since Memorial Day weekend, she states that every once in a while she will become very fatigued, just generally exhausted, this last for a few days at a time prior to resolving.  When she is having this fatigue she has intermittent nausea, chest discomfort, and dyspnea, the associated symptoms do not all occur together instead often separately.  States the chest discomfort feels like a squeezing discomfort to the left side the last for a few minutes at a time prior to resolution.  She has no alleviating or aggravating factors to her symptoms, no change with a deep breath or exertion.  She does not think anything specifically triggers the symptoms.  The only thing that she can think of is that she had a friend passed away Memorial Day weekend on a previous year and she also had a friend passed away in October 17, 2023 of last year which she initially thought may have been causing her symptoms.  Yesterday she felt particularly tired and overheated in the shower and thought she was going to potentially pass out.  She went to her PCP office today for general evaluation it was sent to the emergency department for further evaluation.  Patient denies fever, chills, syncope, leg pain/swelling, hemoptysis, recent travel/surgery/trauma, or prior history of VTE.  History of skin cancer, resolved, no active treatment.  She currently has mild fatigue, otherwise is asymptomatic.  HPI     Past Medical History:    Diagnosis Date  . Anxiety   . 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 Depression   . Diabetes mellitus   . Diabetes mellitus, type II ([place])   . GERD (gastroesophageal reflux disease)   . Hematoma    on buttock  . Hyperlipidemia   . Hypertension   . Migraine   . Seasonal allergies     Patient Active Problem List   Diagnosis Date Noted  . History of squamous cell carcinoma in situ (SCCIS) of skin 12/11/2019  . NASH (nonalcoholic steatohepatitis) 07/26/2015  . BMI 39.0-39.9,adult 03/19/2012  . Hematoma 03/19/2012  . HTN (hypertension), benign 01/09/2012  . Osteopenia 01/09/2012  . Migraines 01/09/2012  . Insomnia 01/09/2012  . Anxiety and depression 01/09/2012  . Hyperlipidemia 01/09/2012  . Diabetes mellitus 01/09/2012  . Allergic rhinitis 01/09/2012    Past Surgical History:  Procedure Laterality Date  . ABDOMINAL HYSTERECTOMY  2004   PCOS  . EYE SURGERY    . INNER EAR SURGERY Left 08/2016     OB History   No obstetric history on file.     Family History  Problem Relation Age of Onset  . Cancer Mother 31       colon  . Diabetes Mother   . Rheum arthritis Mother   . Dementia Mother   . Depression Mother   . Cancer Father 23       bone and prostate  . Cancer Maternal Grandmother        breast  .  Diabetes Maternal Grandfather   . Depression Cousin     Social History   Tobacco Use  . Smoking status: Never Smoker  . Smokeless tobacco: Never Used  Vaping Use  . Vaping Use: Never used  Substance Use Topics  . Alcohol use: Not Currently    Alcohol/week: 8.0 - 10.0 standard drinks    Types: 6 Cans of beer, 2 Shots of liquor per week  . Drug use: No    Home Medications Prior to Admission medications   Medication Sig Start Date End Date Taking? Authorizing Provider  citalopram (CELEXA) 40 MG tablet Take 1 tablet (40 mg total) by mouth daily. 05/10/18 04/02/20 Yes Eksir, Richard Miu, MD  clonazePAM (KLONOPIN) 0.5 MG tablet Take 1 tablet  (0.5 mg total) by mouth 2 (two) times daily. Patient taking differently: Take 0.5 mg by mouth daily.  05/10/18 04/02/20 Yes Eksir, Richard Miu, MD  DEXILANT 60 MG capsule TAKE 1 CAPSULE BY MOUTH EVERY DAY 02/07/20  Yes Copland, Gay Filler, MD  Insulin Glargine (BASAGLAR KWIKPEN) 100 UNIT/ML START WITH 10 UNITS DAILY, INCREASE AS DIRECTED BY MD. ESTIMATE FINAL DOSE WILL BE 25 UNITS DAILY 03/03/20  Yes Copland, Gay Filler, MD  lisinopril-hydrochlorothiazide (ZESTORETIC) 10-12.5 MG tablet TAKE 1/2 TABLET ONCE A DAY. MAY INCREASE TO WHOLE TABLET IF DIRECTED BY MD 08/26/19  Yes Copland, Gay Filler, MD  pioglitazone (ACTOS) 30 MG tablet Take 1 tablet (30 mg total) by mouth daily. 03/06/19  Yes Copland, Gay Filler, MD  rosuvastatin (CRESTOR) 40 MG tablet TAKE 1 TABLET BY MOUTH EVERY DAY 08/06/19  Yes Copland, Gay Filler, MD  ARIPiprazole (ABILIFY) 5 MG tablet Take 2.5 mg by mouth daily. 08/03/18   [provider]  BD PEN NEEDLE NANO U/F 32G X 4 MM MISC USE AS DIRECTED 08/26/19   Copland, Gay Filler, MD  glucose blood test strip Use to check glucose 1-2x daily 06/14/17   Copland, Gay Filler, MD  Multiple Vitamins-Minerals (MULTIVITAMIN WITH MINERALS) tablet Take 1 tablet by mouth daily.    [provider]  TRULICITY 1.5 YD/7.4JO SOPN INJECT 1.5 MG PER WEEK FOR DIABETES 02/26/20   Copland, Gay Filler, MD    Allergies    Buspar [buspirone], Augmentin [amoxicillin-pot clavulanate], Biaxin [clarithromycin], Ceftin [cefuroxime], Ciprofloxacin, Dapagliflozin, Doxycycline, Levaquin [levofloxacin], Metformin and related, Phenazopyridine hcl, Pyridium [phenazopyridine hcl], Sulfa antibiotics, Latex, and Lorabid [loracarbef]  Review of Systems   Review of Systems  Constitutional: Positive for fatigue. Negative for chills and fever.  Respiratory: Positive for shortness of breath. Negative for cough.   Cardiovascular: Positive for chest pain. Negative for palpitations and leg swelling.  Gastrointestinal: Positive  for nausea. Negative for abdominal pain, blood in stool, constipation, diarrhea and vomiting.  Genitourinary: Negative for dysuria.  Neurological: Positive for light-headedness. Negative for syncope.  All other systems reviewed and are negative.   Physical Exam Updated Vital Signs BP 116/82 (BP Location: Right Arm)   Pulse 77   Resp 10   SpO2 97%  Temp: 98.9 degrees F  Physical Exam Vitals and nursing note reviewed.  Constitutional:      General: She is not in acute distress.    Appearance: She is well-developed. She is not toxic-appearing.  HENT:     Head: Normocephalic and atraumatic.  Eyes:     General:        Right eye: No discharge.        Left eye: No discharge.     Conjunctiva/sclera: Conjunctivae normal.  Cardiovascular:  Rate and Rhythm: Normal rate and regular rhythm.     Pulses:          Radial pulses are 2+ on the right side and 2+ on the left side.       Dorsalis pedis pulses are 2+ on the right side and 2+ on the left side.  Pulmonary:     Effort: Pulmonary effort is normal. No respiratory distress.     Breath sounds: Normal breath sounds. No wheezing, rhonchi or rales.  Abdominal:     General: There is no distension.     Palpations: Abdomen is soft.     Tenderness: There is no abdominal tenderness.  Musculoskeletal:     Cervical back: Neck supple.     Right lower leg: No tenderness. No edema.  Skin:    General: Skin is warm and dry.     Findings: No rash.  Neurological:     Mental Status: She is alert.     Comments: Clear speech.   Psychiatric:        Behavior: Behavior normal.     ED Results / Procedures / Treatments   Labs (all labs ordered are listed, but only abnormal results are displayed) Labs Reviewed  COMPREHENSIVE METABOLIC PANEL - Abnormal; Notable for the following components:      Result Value   Sodium 133 (*)    Chloride 97 (*)    Glucose, Bld 291 (*)    AST 141 (*)    ALT 119 (*)    Alkaline Phosphatase 150 (*)    All  other components within normal limits  CBC WITH DIFFERENTIAL/PLATELET - Abnormal; Notable for the following components:   RBC 5.48 (*)    Hemoglobin 15.6 (*)    HCT 46.8 (*)    All other components within normal limits  TSH  TROPONIN I (HIGH SENSITIVITY)  TROPONIN I (HIGH SENSITIVITY)    EKG EKG Interpretation  Date/Time:  Thursday April 02 2020 12:30:09 EDT Ventricular Rate:  83 PR Interval:    QRS Duration: 71 QT Interval:  436 QTC Calculation: 513 R Axis:   -10 Text Interpretation: Sinus rhythm Abnormal R-wave progression, early transition Borderline T abnormalities, anterior leads Prolonged QT interval No significant change since last tracing Confirmed by Fredia Sorrow [phone]) on 04/02/2020 12:33:25 PM   Radiology DG Chest 2 View  Result Date: 04/02/2020 CLINICAL DATA:  Chest pain. Additional provided: Chest pain and shortness of breath. EXAM: CHEST - 2 VIEW COMPARISON:  Prior chest radiographs 04/19/2018 and earlier FINDINGS: Heart size within normal limits. There is no appreciable airspace consolidation. No evidence of pleural effusion or pneumothorax. No acute bony abnormality identified. Thoracic spondylosis. IMPRESSION: No evidence of acute cardiopulmonary abnormality. Electronically Signed   By: Kellie Simmering DO   On: 04/02/2020 13:19    Procedures Procedures (including critical care time)  4:11 PM Cardiac monitoring reveals 79 bpm NSR as reviewed and interpreted by me. Cardiac monitoring was ordered due to chest pain & fatigue and to monitor patient for dysrhythmia.   Medications Ordered in ED Medications - No data to display  ED Course  I have reviewed the triage vital signs and the nursing notes.  Pertinent labs & imaging results that were available during my care of the patient were reviewed by me and considered in my medical decision making (see chart for details).    MDM Rules/Calculators/A&P  Patient presents to the ED with  complaints of chest pain. Nontoxic, vitals without significant abnormality.  Benign physical exam.  Ddx: ACS, PE, dissection, critical anemia, thyroid dysfunction, electrolyte derangement, pneumothorax, pneumonia, depression/anxiety, MSK.  Additional history obtained:  Additional history obtained from chart review & nursing note review. Previous records obtained and reviewed.   ED EKG: NO significant change compared to prior tracing.   Lab Tests:  I Ordered, reviewed, and interpreted labs, which included:  CBC: No significant anemia or leukocytosis, hemoglobin/hematocrit are mildly elevated. CMP: Elevated LFTs/alk phosphatase, she has a history of similar in the past.  Mild hyponatremia/hypochloremia without significant electrolyte derangement.  Patient is hyperglycemic without acidosis or anion gap elevation to indicate DKA. Troponin: Flat and not elevated.  Imaging Studies ordered:  I ordered imaging studies which included CXR, I independently visualized and interpreted imaging which showed No evidence of acute cardiopulmonary abnormality.  ED Course:  HEAR score 4, symptoms intermittently for several weeks, EKG without obvious acute ischemia compared to prior, delta troponin negative, doubt ACS. Patient is low risk wells,  doubt pulmonary embolism. Pain is not a tearing sensation, symmetric pulses, no widening of mediastinum on CXR, doubt dissection. Cardiac monitor reviewed, no notable arrhythmias or tachycardia.  Her labs appear fairly baseline for her.  Patient has appeared hemodynamically stable throughout ER visit and appears safe for discharge with close PCP/cardiology follow up.  Discussed with supervising physician Dr. Rogene Houston who has personally evaluated the patient, in agreement with plan for discharge with cardiology follow-up, recommend starting patient on 1 baby aspirin per day.  I discussed results, treatment plan, need for follow-up, and return precautions with the patient.  Provided opportunity for questions, patient confirmed understanding and is in agreement with plan.   Portions of this note were generated with [employer]. Dictation errors may occur despite best attempts at proofreading.  Final Clinical Impression(s) / ED Diagnoses Final diagnoses:  Chest pain, unspecified type    Rx / DC Orders ED Discharge Orders         Ordered    aspirin 81 MG chewable tablet  Daily     Discontinue  Reprint     04/02/20 1613           Myracle Febres, Glynda Jaeger, PA-C 04/02/20 1614    Fredia Sorrow, MD 04/03/20 1214

## 2020-06-19 ENCOUNTER — Other Ambulatory Visit: Payer: BC Managed Care – PPO

## 2020-07-02 ENCOUNTER — Telehealth: Payer: Self-pay | Admitting: *Deleted

## 2020-07-02 DIAGNOSIS — E119 Type 2 diabetes mellitus without complications: Secondary | ICD-10-CM

## 2020-07-02 MED ORDER — ONETOUCH VERIO VI STRP: ORAL_STRIP | 12 refills | Status: DC

## 2020-07-02 NOTE — Telephone Encounter (Signed)
Relationship To Patient Self Return Phone Number [phone] (Primary) Chief Complaint Blood Sugar High Reason for Call Symptomatic / Request for Health Information Initial Comment Caller states that her blood sugar is currently 269. Translation No Nurse Assessment Nurse: Hassell Done, RN, Melanie Date/Time (Eastern Time): 07/01/2020 4:17:26 PM Confirm and document reason for call. If symptomatic, describe symptoms. ---Caller states her blood sugar was 269 this morning and takes her medication later in the evening.

## 2020-07-02 NOTE — Telephone Encounter (Signed)
Lab Results  Component Value Date   HGBA1C 10.6 (H) 01/22/2020    Called patient back.  History of uncontrolled diabetes, most recent A1c as above Pt notes she felt pre-syncopal at work about 4 days ago and her blood sugar was really high- nearly 450 She admits she is skipping her insulin most days of the week, not taking her Actos or Trulicity She thought that maybe this is why she felt bad, decided to start taking her medications  She has taken 27 units of basaglar the last 3 days  This am fasting glucose 234  She notes that the test strips she is using for about 18 months expired, she asked me how this will affect the results.  I advised her that we do not know this exact information.  I would encourage her to get some new test strips.  She has an appointment to see me on Monday.  I asked her to continue taking her medications as above until that time, let me know if she is not doing okay in the meantime

## 2020-07-04 NOTE — Progress Notes (Addendum)
[facility] [address], [place], [place] [address] [phone]  Date:  07/06/2020   Name:  Natalie Richard   DOB:  03/17/1966   MRN:  [id number]  PCP:  Darreld Mclean, MD    Chief Complaint: Diabetes (high blood suagr reading, 233 fasting yesterday)   History of Present Illness:  Natalie Richard is a 54 y.o. very pleasant female patient who presents with the following:  Patient here today for diabetes follow-up History of uncontrolled diabetes, hypertension, Karlene Lineman, obesity, hyperlipidemia, insomnia/anxiety and depression  Last seen by myself in July of this year-at that time we ended up sending her to the emergency room with chest pain; she was ruled out and released to home  We recently spoke by telephone about concerning symptoms-she was having symptomatic hyperglycemia, had not been taking her medication Pt notes she felt pre-syncopal at work about 4 days ago and her blood sugar was really high- nearly 450 She admits she is skipping her insulin most days of the week, not taking her Actos or Trulicity She thought that maybe this is why she felt bad, decided to start taking her medications  She has taken 27 units of basaglar the last 3 days  This am fasting glucose 234  She notes that the test strips she is using for about 18 months expired, she asked me how this will affect the results.  I advised her that we do not know this exact information.  I would encourage her to get some new test strips.  She has an appointment to see me on Monday.  I asked her to continue taking her medications as above until that time, let me know if she is not doing okay in the meantime  COVID-19 series- done  Flu vaccine- declines today  Pap smear- she has a GYN and plans to do this Mammogram- she does in [facility] exam- about a year ago  Tetanus booster due- declines today  CBC, c-Met on chart from July-LFTs higher than  baseline at that time She is fasting at this time  She got some new test strips  She had stopped taking actos, was taking insulin some of the time, kept up with her trulicity  She has been consistent with all her diabetes meds for about one week now  She still feels tired but overall better  abilify 5 prn dexilant Insulin glargine Lisinipril/hctz actos 40 mg trulicity- she has been using this on a regular basis Taking celexa  Klonopin 0.5 mg in the am  Intolerant to metformin  Lab Results  Component Value Date   HGBA1C 10.6 (H) 01/22/2020   Wt Readings from Last 3 Encounters:  07/06/20 209 lb (94.8 kg)  04/02/20 211 lb (95.7 kg)  01/22/20 217 lb (98.4 kg)      Patient Active Problem List   Diagnosis Date Noted  . History of squamous cell carcinoma in situ (SCCIS) of skin 12/11/2019  . NASH (nonalcoholic steatohepatitis) 07/26/2015  . BMI 39.0-39.9,adult 03/19/2012  . Hematoma 03/19/2012  . HTN (hypertension), benign 01/09/2012  . Osteopenia 01/09/2012  . Migraines 01/09/2012  . Insomnia 01/09/2012  . Anxiety and depression 01/09/2012  . Hyperlipidemia 01/09/2012  . Diabetes mellitus 01/09/2012  . Allergic rhinitis 01/09/2012    Past Medical History:  Diagnosis Date  . Anxiety   . 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 Depression   .  Diabetes mellitus   . Diabetes mellitus, type II ([place])   . GERD (gastroesophageal reflux disease)   . Hematoma    on buttock  . Hyperlipidemia   . Hypertension   . Migraine   . Seasonal allergies     Past Surgical History:  Procedure Laterality Date  . ABDOMINAL HYSTERECTOMY  2004   PCOS  . EYE SURGERY    . INNER EAR SURGERY Left 08/2016    Social History   Tobacco Use  . Smoking status: Never Smoker  . Smokeless tobacco: Never Used  Vaping Use  . Vaping Use: Never used  Substance Use Topics  . Alcohol use: Not Currently    Alcohol/week: 8.0 - 10.0 standard drinks    Types: 6 Cans of beer, 2 Shots  of liquor per week  . Drug use: No    Family History  Problem Relation Age of Onset  . Cancer Mother 81       colon  . Diabetes Mother   . Rheum arthritis Mother   . Dementia Mother   . Depression Mother   . Cancer Father 30       bone and prostate  . Cancer Maternal Grandmother        breast  . Diabetes Maternal Grandfather   . Depression Cousin     Allergies  Allergen Reactions  . Buspar [Buspirone] Other (See Comments)    Lip swelling and rash numbness   . Augmentin [Amoxicillin-Pot Clavulanate] Nausea And Vomiting  . Biaxin [Clarithromycin]   . Ceftin [Cefuroxime] Swelling    Eye swelling, shortness of breath   . Ciprofloxacin     Developed itching with IV form in the hospital, but has since taken PO without a problem  . Dapagliflozin Other (See Comments)    Burning upon urination. Increased blood sugar.  . Doxycycline Itching  . Levaquin [Levofloxacin] Hives  . Metformin And Related Nausea And Vomiting  . Phenazopyridine Hcl Other (See Comments)    Does not remember reaction  . Pyridium [Phenazopyridine Hcl]     Does not remember reaction  . Sulfa Antibiotics     As a younger person had itching, but has taken more recently and did ok  . Latex Rash  . Lorabid [Loracarbef] Rash    Medication list has been reviewed and updated.  Current Outpatient Medications on File Prior to Visit  Medication Sig Dispense Refill  . ARIPiprazole (ABILIFY) 5 MG tablet Take 2.5 mg by mouth daily.    . BD PEN NEEDLE NANO U/F 32G X 4 MM MISC USE AS DIRECTED 100 each 3  . DEXILANT 60 MG capsule TAKE 1 CAPSULE BY MOUTH EVERY DAY 90 capsule 1  . glucose blood (ONETOUCH VERIO) test strip Use as instructed- may check blood sugar up to twice a day. 100 each 12  . glucose blood test strip Use to check glucose 1-2x daily 100 each 12  . Insulin Glargine (BASAGLAR KWIKPEN) 100 UNIT/ML START WITH 10 UNITS DAILY, INCREASE AS DIRECTED BY MD. ESTIMATE FINAL DOSE WILL BE 25 UNITS DAILY 24 mL 7   . lisinopril-hydrochlorothiazide (ZESTORETIC) 10-12.5 MG tablet TAKE 1/2 TABLET ONCE A DAY. MAY INCREASE TO WHOLE TABLET IF DIRECTED BY MD 90 tablet 3  . Loratadine (CLARITIN) 10 MG CAPS Take by mouth.    . Multiple Vitamins-Minerals (MULTIVITAMIN WITH MINERALS) tablet Take 1 tablet by mouth daily.    . pioglitazone (ACTOS) 30 MG tablet Take 1 tablet (30 mg total) by mouth daily. [place]  tablet 3  . rosuvastatin (CRESTOR) 40 MG tablet TAKE 1 TABLET BY MOUTH EVERY DAY 90 tablet 1  . TRULICITY 1.5 WT/8.8EK SOPN INJECT 1.5 MG PER WEEK FOR DIABETES 6 pen 2  . citalopram (CELEXA) 40 MG tablet Take 1 tablet (40 mg total) by mouth daily. 90 tablet 0  . clonazePAM (KLONOPIN) 0.5 MG tablet Take 1 tablet (0.5 mg total) by mouth 2 (two) times daily. (Patient taking differently: Take 0.5 mg by mouth daily. ) 180 tablet 0   No current facility-administered medications on file prior to visit.    Review of Systems:  As per HPI- otherwise negative.   Physical Examination: Vitals:   07/06/20 1051  BP: 118/82  Pulse: 87  Resp: 15  SpO2: 96%   Vitals:   07/06/20 1051  Weight: 209 lb (94.8 kg)  Height: 5' 6"  (1.676 m)   Body mass index is 33.73 kg/m. Ideal Body Weight: Weight in (lb) to have BMI = 25: 154.6  GEN: no acute distress.  Obese, looks well  HEENT: Atraumatic, Normocephalic.  Ears and Nose: No external deformity. CV: RRR, No M/G/R. No JVD. No thrill. No extra heart sounds. PULM: CTA B, no wheezes, crackles, rhonchi. No retractions. No resp. distress. No accessory muscle use. ABD: S, NT, ND, +BS. No rebound. No HSM. EXTR: No c/c/e PSYCH: Normally interactive. Conversant.    Assessment and Plan: Type 2 diabetes mellitus without complication, without long-term current use of insulin (HCC) - Plan: Hemoglobin A1c, Comprehensive metabolic panel  Mixed hyperlipidemia  HTN (hypertension), benign  NASH (nonalcoholic steatohepatitis)  Panic disorder - Plan: citalopram (CELEXA) 40 MG  tablet  Recurrent major depressive disorder, in partial remission ([place]) - Plan: citalopram (CELEXA) 40 MG tablet  Following up today for diabetes care Patient had stopped taking her insulin and Actos, she has recently begun taking this regularly again and her glucoses is coming under better control We will check A1c today, discussed plans to gradually increase insulin as needed Refilled citalopram Blood pressure under good control Patient has history of NASH, check liver function test today This visit occurred during the SARS-CoV-2 public health emergency.  Safety protocols were in place, including screening questions prior to the visit, additional usage of staff PPE, and extensive cleaning of exam room while observing appropriate contact time as indicated for disinfecting solutions.    Signed Lamar Blinks, MD  Addendum 10/5, received her labs as below Letter to patient  Results for orders placed or performed in visit on 07/06/20  Hemoglobin A1c  Result Value Ref Range   Hgb A1c MFr Bld 12.5 (H) <5.7 % of total Hgb   Mean Plasma Glucose 312 (calc)   eAG (mmol/L) 17.3 (calc)  Comprehensive metabolic panel  Result Value Ref Range   Glucose, Bld 219 (H) 65 - 99 mg/dL   BUN 9 7 - 25 mg/dL   Creat 0.81 0.50 - 1.05 mg/dL   BUN/Creatinine Ratio NOT APPLICABLE 6 - 22 (calc)   Sodium 136 135 - 146 mmol/L   Potassium 4.6 3.5 - 5.3 mmol/L   Chloride 98 98 - 110 mmol/L   CO2 28 20 - 32 mmol/L   Calcium 9.6 8.6 - 10.4 mg/dL   Total Protein 7.0 6.1 - 8.1 g/dL   Albumin 4.0 3.6 - 5.1 g/dL   Globulin 3.0 1.9 - 3.7 g/dL (calc)   AG Ratio 1.3 1.0 - 2.5 (calc)   Total Bilirubin 0.4 0.2 - 1.2 mg/dL   Alkaline phosphatase (APISO) 150 37 -  153 U/L   AST 33 10 - 35 U/L   ALT 40 (H) 6 - 29 U/L

## 2020-07-04 NOTE — Patient Instructions (Addendum)
Great to see you again today, take care!   I will be in touch with your labs asap Please continue to increase your insulin but 2 units every 2-3 days until your fasting glucose is close to 150 Work on exercise and a lower carb diet - this will also help control your sugars!  I encourage you to get a flu shot, mammogram, pap screening and tetanus vaccine, as well as eye exam when due

## 2020-07-06 ENCOUNTER — Encounter: Payer: Self-pay | Admitting: Family Medicine

## 2020-07-06 ENCOUNTER — Ambulatory Visit (INDEPENDENT_AMBULATORY_CARE_PROVIDER_SITE_OTHER): Payer: BC Managed Care – PPO | Admitting: Family Medicine

## 2020-07-06 ENCOUNTER — Other Ambulatory Visit: Payer: Self-pay

## 2020-07-06 VITALS — BP 118/82 | HR 87 | Resp 15 | Ht 66.0 in | Wt 209.0 lb

## 2020-07-06 DIAGNOSIS — F3341 Major depressive disorder, recurrent, in partial remission: Secondary | ICD-10-CM

## 2020-07-06 DIAGNOSIS — I10 Essential (primary) hypertension: Secondary | ICD-10-CM

## 2020-07-06 DIAGNOSIS — E782 Mixed hyperlipidemia: Secondary | ICD-10-CM | POA: Diagnosis not present

## 2020-07-06 DIAGNOSIS — K7581 Nonalcoholic steatohepatitis (NASH): Secondary | ICD-10-CM

## 2020-07-06 DIAGNOSIS — F410 Panic disorder [episodic paroxysmal anxiety] without agoraphobia: Secondary | ICD-10-CM

## 2020-07-06 DIAGNOSIS — E119 Type 2 diabetes mellitus without complications: Secondary | ICD-10-CM

## 2020-07-06 MED ORDER — CITALOPRAM HYDROBROMIDE 40 MG PO TABS: 40.0000 mg | ORAL_TABLET | Freq: Every day | ORAL | 3 refills | Status: DC

## 2020-07-07 LAB — COMPREHENSIVE METABOLIC PANEL
AG Ratio: 1.3 (calc) (ref 1.0–2.5)
ALT: 40 U/L — ABNORMAL HIGH (ref 6–29)
AST: 33 U/L (ref 10–35)
Albumin: 4 g/dL (ref 3.6–5.1)
Alkaline phosphatase (APISO): 150 U/L (ref 37–153)
BUN: 9 mg/dL (ref 7–25)
CO2: 28 mmol/L (ref 20–32)
Calcium: 9.6 mg/dL (ref 8.6–10.4)
Chloride: 98 mmol/L (ref 98–110)
Creat: 0.81 mg/dL (ref 0.50–1.05)
Globulin: 3 g/dL (calc) (ref 1.9–3.7)
Glucose, Bld: 219 mg/dL — ABNORMAL HIGH (ref 65–99)
Potassium: 4.6 mmol/L (ref 3.5–5.3)
Sodium: 136 mmol/L (ref 135–146)
Total Bilirubin: 0.4 mg/dL (ref 0.2–1.2)
Total Protein: 7 g/dL (ref 6.1–8.1)

## 2020-07-07 LAB — HEMOGLOBIN A1C
Hgb A1c MFr Bld: 12.5 % of total Hgb — ABNORMAL HIGH (ref <5.7)
Mean Plasma Glucose: 312 (calc)
eAG (mmol/L): 17.3 (calc)

## 2020-08-09 ENCOUNTER — Other Ambulatory Visit: Payer: Self-pay | Admitting: Family Medicine

## 2020-08-09 DIAGNOSIS — K219 Gastro-esophageal reflux disease without esophagitis: Secondary | ICD-10-CM

## 2020-08-10 ENCOUNTER — Telehealth: Payer: Self-pay | Admitting: Family Medicine

## 2020-08-10 NOTE — Telephone Encounter (Signed)
Patient states her diabetes is not controlled well in the mornings, patient states her reading are anywhere from low 200's to 270.  Please Advise

## 2020-08-11 NOTE — Telephone Encounter (Signed)
Patient last seen in the office about 1 month ago At that time she had recently restarted her insulin and Actos, was seeing some improvement in previously very high blood sugars Also taking Trulicity  N7N 1 month ago 12.5% She had recently started taking Abilify but stopped using with approval of her psychiatrist 3 days ago.  Advised her that stopping Abilify may well lower her blood sugar She is using 32 units of insulin right now  Advised to increase insulin to 34 units, stayed there for a few days and see if glucose will come closer to 150.  If still too high, can go to 36 units.  However, use caution as her blood sugars may come down as she adjust to stopping Abilify.  She will keep me posted

## 2020-08-24 ENCOUNTER — Telehealth: Payer: Self-pay

## 2020-08-24 ENCOUNTER — Other Ambulatory Visit: Payer: Self-pay

## 2020-08-24 ENCOUNTER — Telehealth (INDEPENDENT_AMBULATORY_CARE_PROVIDER_SITE_OTHER): Payer: BC Managed Care – PPO | Admitting: Medical

## 2020-08-24 DIAGNOSIS — J0100 Acute maxillary sinusitis, unspecified: Secondary | ICD-10-CM

## 2020-08-24 DIAGNOSIS — J309 Allergic rhinitis, unspecified: Secondary | ICD-10-CM

## 2020-08-24 MED ORDER — AMOXICILLIN 875 MG PO TABS
875.0000 mg | ORAL_TABLET | Freq: Two times a day (BID) | ORAL | 0 refills | Status: DC
Start: 2020-08-24 — End: 2020-11-09

## 2020-08-24 NOTE — Progress Notes (Signed)
   Subjective:    Patient ID: Natalie Richard, female    DOB: 08-01-1966, 54 y.o.   MRN: [id number]  HPI  Virtual Visit via Telephone Note  I connected with Natalie Richard on 08/24/20 at  2:20 PM EST by telephone and verified that I am speaking with the correct person using two identifiers.  Location: Patient: home Provider: office   I discussed the limitations, risks, security and privacy concerns of performing an evaluation and management service by telephone and the availability of in person appointments. I also discussed with the patient that there may be a patient responsible charge related to this service. The patient expressed understanding and agreed to proceed.   History of Present Illness:   Pt has had symptoms since last Wednesday. Has had nasal congestion and sinus pressure. Very stopped up. Blows out yellow green colored mucus with blood tinge. Maxillary sinus pressure and ethmoid sinus pain described.  Pt states hx of sinus infection couple of time a year. Usually in the fall. Some sneezing at onset.  Pt has been using over the counter cough meds.  Pt did not check bp or pulse.  Pt has had covid vaccines.   Observations/Objective:  General-no acute distress, pleasant, alert and oriented.  Normal speech.  Does sound little congested. HEENT-patient reports on self palpation has pain over maxillary sinuses and describes ethmoid sinus region pain.    Assessment and Plan: Probable maxillary sinusitis with history of allergic rhinitis.  Prescribed amoxicillin as patient has limited choice based on her allergy profile and she does report this is often what her PCP prescribes.  Continue with Flonase nasal spray and use benzonatate for cough as needed.  If symptoms were to persist would likely add on or switch to azithromycin.  Advised patient that if later in the week she still has congestion with sinus pressure then get over-the-counter Covid test.  Could do 1  test on Thursday and then repeat on Sunday.  That way would enable [place] to see you in the office if needed.  Follow-up 7 to 10 days or as needed.  Time spent with patient today was 15  minutes which consisted of chart revdiew, discussing diagnosis, work up treatment and documentation.  Follow Up Instructions:    I discussed the assessment and treatment plan with the patient. The patient was provided an opportunity to ask questions and all were answered. The patient agreed with the plan and demonstrated an understanding of the instructions.   The patient was advised to call back or seek an in-person evaluation if the symptoms worsen or if the condition fails to improve as anticipated.     Mackie Pai, PA-C    Review of Systems     Objective:   Physical Exam        Assessment & Plan:

## 2020-08-24 NOTE — Patient Instructions (Signed)
Probable maxillary sinusitis with history of allergic rhinitis.  Prescribed amoxicillin as patient has limited choice based on her allergy profile and she does report this is often what her PCP prescribes.  Continue with Flonase nasal spray and use benzonatate for cough as needed.  If symptoms were to persist would likely add on or switch to azithromycin.  Advised patient that if later in the week she still has congestion with sinus pressure then get over-the-counter Covid test.  Could do 1 test on Thursday and then repeat on Sunday.  That way would enable [place] to see you in the office if needed.  Follow-up 7 to 10 days or as needed.

## 2020-08-24 NOTE — Telephone Encounter (Signed)
Patient seen in office with Mackie Pai.

## 2020-08-24 NOTE — Telephone Encounter (Signed)
Nurse Assessment Nurse: Mariea Clonts, RN, Cristan Date/Time (Eastern Time): 08/23/2020 12:21:17 PM Confirm and document reason for call. If symptomatic, describe symptoms. ---Caller states she thinks she has a sinus infection, she has green blood tinged coming from her nose, and she is coughing up green stuff as well. Low grade fever. Symptoms started with sore throat on Thursday. Caller also c/o runny nose. has pressure under eyes and across nose. Does the patient have any new or worsening symptoms? ---Yes Will a triage be completed? ---Yes Related visit to physician within the last 2 weeks? ---N/A Does the PT have any chronic conditions? (i.e. diabetes, asthma, this includes High risk factors for pregnancy, etc.) ---Yes List chronic conditions. ---DM type 2 Is this a behavioral health or substance abuse call? ---No Guidelines Guideline Title Affirmed Question Affirmed Notes Nurse Date/Time (Eastern Time) COVID-19 - Diagnosed or Suspected [1] COVID-19 infection suspected by caller or triager AND [2] mild symptoms (cough, fever, or others) AND [3] has not gotten tested yet Mariea Clonts, Therapist, sports, Cristan 08/23/2020 12:28:48 PM Disp. Time Eilene Ghazi Time) Disposition Final User 08/23/2020 12:31:55 PM Call PCP when Office is Open Yes Mariea Clonts, Therapist, sports, [place] PLEASE NOTE: All timestamps contained within this report are represented as [place] Standard Time. CONFIDENTIALTY NOTICE: This fax transmission is intended only for the addressee. It contains information that is legally privileged, confidential or otherwise protected from use or disclosure. If you are not the intended recipient, you are strictly prohibited from reviewing, disclosing, copying using or disseminating any of this information or taking any action in reliance on or regarding this information. If you have received this fax in error, please notify us immediately by telephone so that we can arrange for its return to [place]. Phone: [phone], Toll-Free:  [phone], Fax: [phone] Page: 2 of 2 Call Id: [id number] Caller Disagree/Comply Comply Caller Understands Yes PreDisposition Did not know what to do Care Advice Given Per Guideline CALL PCP WHEN OFFICE IS OPEN: CALL BACK IF: * Fever over 103 F (39.4 C) * Fever lasts over 3 days * Fever returns after being gone for 24 hours * Chest pain or difficulty breathing occurs * You become worse CARE ADVICE given per COVID-19 - DIAGNOSED OR SUSPECTED (Adult) guideline. * You need to discuss this with your doctor (or NP/PA) within the next few days. * Call the office when it is open. Comments User: Luis Abed, RN Date/Time Eilene Ghazi Time): 08/23/2020 12:26:12 PM current temp 98.6 orally. Referrals REFERRED TO PCP OFFICE

## 2020-10-03 NOTE — Progress Notes (Signed)
[place] at [facility] [address] [place], [place] [address] [phone]  Date:  10/07/2020   Name:  Natalie Richard   DOB:  [birth year]   MRN:  [id number]  PCP:  Darreld Mclean, MD    Chief Complaint: No chief complaint on file.   History of Present Illness:  Natalie Richard is a 55 y.o. very pleasant female patient who presents with the following:  Short term follow-up visit today- virtual visit  Patient location is home, provider location is office.  Patient identity confirmed with 2 factors, she gives consent for virtual visit today.  Connected with patient via phone, the patient myself are present on the call today  Pt last seen by myself in October- History of uncontrolled diabetes, hypertension, Nash, obesity, hyperlipidemia, insomnia/anxiety and depression  She got sick 10/04/19- her sx are scratchy throat, cough, head congestion. She was tested for COVID-19 in the last couple of days, reports that she just got her test results back today and she is positive She is not sure if a fever- she is having sweats however  No SOB, no distress She did not get a booster-did get 1 dose of [employer] in March   Her husband is ill as well- he has not gotten tested yet   Lab Results  Component Value Date   HGBA1C 12.5 (H) 07/06/2020   Note 11/8 Patient last seen in the office about 1 month ago At that time she had recently restarted her insulin and Actos, was seeing some improvement in previously very high blood sugars Also taking Trulicity E9F 1 month ago 12.5% She had recently started taking Abilify but stopped using with approval of her psychiatrist 3 days ago.  Advised her that stopping Abilify may well lower her blood sugar She is using 32 units of insulin right now Advised to increase insulin to 34 units, stayed there for a few days and see if glucose will come closer to 150.  If still too high, can go to 36  units.  However, use caution as her blood sugars may come down as she adjust to stopping Abilify.  She will keep me posted   Patient Active Problem List   Diagnosis Date Noted  . History of squamous cell carcinoma in situ (SCCIS) of skin 12/11/2019  . NASH (nonalcoholic steatohepatitis) 07/26/2015  . BMI 39.0-39.9,adult 03/19/2012  . Hematoma 03/19/2012  . HTN (hypertension), benign 01/09/2012  . Osteopenia 01/09/2012  . Migraines 01/09/2012  . Insomnia 01/09/2012  . Anxiety and depression 01/09/2012  . Hyperlipidemia 01/09/2012  . Diabetes mellitus 01/09/2012  . Allergic rhinitis 01/09/2012    Past Medical History:  Diagnosis Date  . Anxiety   . 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 Depression   . Diabetes mellitus   . Diabetes mellitus, type II ([place])   . GERD (gastroesophageal reflux disease)   . Hematoma    on buttock  . Hyperlipidemia   . Hypertension   . Migraine   . Seasonal allergies     Past Surgical History:  Procedure Laterality Date  . ABDOMINAL HYSTERECTOMY  2004   PCOS  . EYE SURGERY    . INNER EAR SURGERY Left 08/2016    Social History   Tobacco Use  . Smoking status: Never Smoker  . Smokeless tobacco: Never Used  Vaping Use  . Vaping Use: Never used  Substance Use Topics  .  Alcohol use: Not Currently    Alcohol/week: 8.0 - 10.0 standard drinks    Types: 6 Cans of beer, 2 Shots of liquor per week  . Drug use: No    Family History  Problem Relation Age of Onset  . Cancer Mother 75       colon  . Diabetes Mother   . Rheum arthritis Mother   . Dementia Mother   . Depression Mother   . Cancer Father 79       bone and prostate  . Cancer Maternal Grandmother        breast  . Diabetes Maternal Grandfather   . Depression Cousin     Allergies  Allergen Reactions  . Buspar [Buspirone] Other (See Comments)    Lip swelling and rash numbness   . Augmentin [Amoxicillin-Pot Clavulanate] Nausea And Vomiting  . Biaxin  [Clarithromycin]   . Ceftin [Cefuroxime] Swelling    Eye swelling, shortness of breath   . Ciprofloxacin     Developed itching with IV form in the hospital, but has since taken PO without a problem  . Dapagliflozin Other (See Comments)    Burning upon urination. Increased blood sugar.  . Doxycycline Itching  . Levaquin [Levofloxacin] Hives  . Metformin And Related Nausea And Vomiting  . Phenazopyridine Hcl Other (See Comments)    Does not remember reaction  . Pyridium [Phenazopyridine Hcl]     Does not remember reaction  . Sulfa Antibiotics     As a younger person had itching, but has taken more recently and did ok  . Latex Rash  . Lorabid [Loracarbef] Rash    Medication list has been reviewed and updated.  Current Outpatient Medications on File Prior to Visit  Medication Sig Dispense Refill  . amoxicillin (AMOXIL) 875 MG tablet Take 1 tablet (875 mg total) by mouth 2 (two) times daily. 20 tablet 0  . ARIPiprazole (ABILIFY) 5 MG tablet Take 2.5 mg by mouth daily.    . BD PEN NEEDLE NANO U/F 32G X 4 MM MISC USE AS DIRECTED 100 each 3  . citalopram (CELEXA) 40 MG tablet Take 1 tablet (40 mg total) by mouth daily. 90 tablet 3  . clonazePAM (KLONOPIN) 0.5 MG tablet Take 1 tablet (0.5 mg total) by mouth 2 (two) times daily. (Patient taking differently: Take 0.5 mg by mouth daily. ) 180 tablet 0  . DEXILANT 60 MG capsule TAKE 1 CAPSULE BY MOUTH EVERY DAY 90 capsule 1  . glucose blood (ONETOUCH VERIO) test strip Use as instructed- may check blood sugar up to twice a day. 100 each 12  . glucose blood test strip Use to check glucose 1-2x daily 100 each 12  . Insulin Glargine (BASAGLAR KWIKPEN) 100 UNIT/ML START WITH 10 UNITS DAILY, INCREASE AS DIRECTED BY MD. ESTIMATE FINAL DOSE WILL BE 25 UNITS DAILY 24 mL 7  . lisinopril-hydrochlorothiazide (ZESTORETIC) 10-12.5 MG tablet TAKE 1/2 TABLET ONCE A DAY. MAY INCREASE TO WHOLE TABLET IF DIRECTED BY MD 90 tablet 3  . Loratadine (CLARITIN) 10 MG  CAPS Take by mouth.    . Multiple Vitamins-Minerals (MULTIVITAMIN WITH MINERALS) tablet Take 1 tablet by mouth daily.    . pioglitazone (ACTOS) 30 MG tablet Take 1 tablet (30 mg total) by mouth daily. 90 tablet 3  . rosuvastatin (CRESTOR) 40 MG tablet TAKE 1 TABLET BY MOUTH EVERY DAY 90 tablet 1  . TRULICITY 1.5 SH/7.57YO SOPN INJECT 1.5 MG PER WEEK FOR DIABETES 6 pen 2   No  current facility-administered medications on file prior to visit.    Review of Systems:  As per HPI- otherwise negative.   Physical Examination: There were no vitals filed for this visit. There were no vitals filed for this visit. There is no height or weight on file to calculate BMI. Ideal Body Weight:   Spoke with patient on the telephone.  She sounds well, no cough or shortness of breath is noted.  She is not checking her vital signs   Assessment and Plan: COVID-19 COVID-19 infection.  Patient was vaccinated but has not been boosted.  Right now she feels mildly to moderately ill, no distress.  I advised her to treat herself at home as she would need for the flu.  Use over-the-counter medications as needed.  Quarantine for 10 days (she has been ill for 5 days but is still symptomatic)-longer if still symptomatic She is asked to seek emergency care if any distress, let me know if I can do anything else to help   Spoke to pt on phone for 6 minutes  This visit occurred during the SARS-CoV-2 public health emergency.  Safety protocols were in place, including screening questions prior to the visit, additional usage of staff PPE, and extensive cleaning of exam room while observing appropriate contact time as indicated for disinfecting solutions.    Signed Lamar Blinks, MD

## 2020-10-03 NOTE — Patient Instructions (Signed)
Good to see you again today  

## 2020-10-07 ENCOUNTER — Telehealth (INDEPENDENT_AMBULATORY_CARE_PROVIDER_SITE_OTHER): Payer: BC Managed Care – PPO | Admitting: Family Medicine

## 2020-10-07 ENCOUNTER — Other Ambulatory Visit: Payer: Self-pay

## 2020-10-07 DIAGNOSIS — U071 COVID-19: Secondary | ICD-10-CM | POA: Diagnosis not present

## 2020-10-31 NOTE — Patient Instructions (Incomplete)
I will be in touch with your labs asap!

## 2020-10-31 NOTE — Progress Notes (Deleted)
[place] at [facility] [address] [place], [place] [address] [phone]  Date:  11/04/2020   Name:  Natalie Richard   DOB:  01-02-1966   MRN:  [id number]  PCP:  Darreld Mclean, MD    Chief Complaint: No chief complaint on file.   History of Present Illness:  Natalie Richard is a 55 y.o. very pleasant female patient who presents with the following:  Here today for a 3 month follow-up visit Last seen by myself in October- we did do a video visit in January of this year as well for covid infection  History of uncontrolled diabetes, hypertension, Nash, obesity, hyperlipidemia, insomnia/anxiety and depression  Lab Results  Component Value Date   HGBA1C 12.5 (H) 07/06/2020   Needs A1c today- at last visit we planned to gradually increase her insulin   Patient Active Problem List   Diagnosis Date Noted  . History of squamous cell carcinoma in situ (SCCIS) of skin 12/11/2019  . NASH (nonalcoholic steatohepatitis) 07/26/2015  . BMI 39.0-39.9,adult 03/19/2012  . Hematoma 03/19/2012  . HTN (hypertension), benign 01/09/2012  . Osteopenia 01/09/2012  . Migraines 01/09/2012  . Insomnia 01/09/2012  . Anxiety and depression 01/09/2012  . Hyperlipidemia 01/09/2012  . Diabetes mellitus 01/09/2012  . Allergic rhinitis 01/09/2012    Past Medical History:  Diagnosis Date  . Anxiety   . 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 Depression   . Diabetes mellitus   . Diabetes mellitus, type II ([place])   . GERD (gastroesophageal reflux disease)   . Hematoma    on buttock  . Hyperlipidemia   . Hypertension   . Migraine   . Seasonal allergies     Past Surgical History:  Procedure Laterality Date  . ABDOMINAL HYSTERECTOMY  2004   PCOS  . EYE SURGERY    . INNER EAR SURGERY Left 08/2016    Social History   Tobacco Use  . Smoking status: Never Smoker  . Smokeless tobacco: Never Used  Vaping Use  .  Vaping Use: Never used  Substance Use Topics  . Alcohol use: Not Currently    Alcohol/week: 8.0 - 10.0 standard drinks    Types: 6 Cans of beer, 2 Shots of liquor per week  . Drug use: No    Family History  Problem Relation Age of Onset  . Cancer Mother 12       colon  . Diabetes Mother   . Rheum arthritis Mother   . Dementia Mother   . Depression Mother   . Cancer Father 37       bone and prostate  . Cancer Maternal Grandmother        breast  . Diabetes Maternal Grandfather   . Depression Cousin     Allergies  Allergen Reactions  . Buspar [Buspirone] Other (See Comments)    Lip swelling and rash numbness   . Augmentin [Amoxicillin-Pot Clavulanate] Nausea And Vomiting  . Biaxin [Clarithromycin]   . Ceftin [Cefuroxime] Swelling    Eye swelling, shortness of breath   . Ciprofloxacin     Developed itching with IV form in the hospital, but has since taken PO without a problem  . Dapagliflozin Other (See Comments)    Burning upon urination. Increased blood sugar.  . Doxycycline Itching  . Levaquin [Levofloxacin] Hives  . Metformin And Related Nausea And Vomiting  . Phenazopyridine Hcl Other (See Comments)  Does not remember reaction  . Pyridium [Phenazopyridine Hcl]     Does not remember reaction  . Sulfa Antibiotics     As a younger person had itching, but has taken more recently and did ok  . Latex Rash  . Lorabid [Loracarbef] Rash    Medication list has been reviewed and updated.  Current Outpatient Medications on File Prior to Visit  Medication Sig Dispense Refill  . amoxicillin (AMOXIL) 875 MG tablet Take 1 tablet (875 mg total) by mouth 2 (two) times daily. 20 tablet 0  . BD PEN NEEDLE NANO U/F 32G X 4 MM MISC USE AS DIRECTED 100 each 3  . citalopram (CELEXA) 40 MG tablet Take 1 tablet (40 mg total) by mouth daily. 90 tablet 3  . clonazePAM (KLONOPIN) 0.5 MG tablet Take 1 tablet (0.5 mg total) by mouth 2 (two) times daily. (Patient taking differently: Take  0.5 mg by mouth daily. ) 180 tablet 0  . DEXILANT 60 MG capsule TAKE 1 CAPSULE BY MOUTH EVERY DAY 90 capsule 1  . glucose blood (ONETOUCH VERIO) test strip Use as instructed- may check blood sugar up to twice a day. 100 each 12  . glucose blood test strip Use to check glucose 1-2x daily 100 each 12  . Insulin Glargine (BASAGLAR KWIKPEN) 100 UNIT/ML START WITH 10 UNITS DAILY, INCREASE AS DIRECTED BY MD. ESTIMATE FINAL DOSE WILL BE 25 UNITS DAILY 24 mL 7  . lisinopril-hydrochlorothiazide (ZESTORETIC) 10-12.5 MG tablet TAKE 1/2 TABLET ONCE A DAY. MAY INCREASE TO WHOLE TABLET IF DIRECTED BY MD 90 tablet 3  . Loratadine (CLARITIN) 10 MG CAPS Take by mouth.    . Multiple Vitamins-Minerals (MULTIVITAMIN WITH MINERALS) tablet Take 1 tablet by mouth daily.    . pioglitazone (ACTOS) 30 MG tablet Take 1 tablet (30 mg total) by mouth daily. 90 tablet 3  . rosuvastatin (CRESTOR) 40 MG tablet TAKE 1 TABLET BY MOUTH EVERY DAY 90 tablet 1  . TRULICITY 1.5 UJ/8.1XB SOPN INJECT 1.5 MG PER WEEK FOR DIABETES 6 pen 2   No current facility-administered medications on file prior to visit.    Review of Systems:  As per HPI- otherwise negative.   Physical Examination: There were no vitals filed for this visit. There were no vitals filed for this visit. There is no height or weight on file to calculate BMI. Ideal Body Weight:    GEN: no acute distress. HEENT: Atraumatic, Normocephalic.  Ears and Nose: No external deformity. CV: RRR, No M/G/R. No JVD. No thrill. No extra heart sounds. PULM: CTA B, no wheezes, crackles, rhonchi. No retractions. No resp. distress. No accessory muscle use. ABD: S, NT, ND, +BS. No rebound. No HSM. EXTR: No c/c/e PSYCH: Normally interactive. Conversant.    Assessment and Plan: *** This visit occurred during the SARS-CoV-2 public health emergency.  Safety protocols were in place, including screening questions prior to the visit, additional usage of staff PPE, and extensive  cleaning of exam room while observing appropriate contact time as indicated for disinfecting solutions.    Signed Lamar Blinks, MD

## 2020-11-04 ENCOUNTER — Ambulatory Visit: Payer: BC Managed Care – PPO | Admitting: Family Medicine

## 2020-11-04 DIAGNOSIS — E119 Type 2 diabetes mellitus without complications: Secondary | ICD-10-CM

## 2020-11-04 DIAGNOSIS — E782 Mixed hyperlipidemia: Secondary | ICD-10-CM

## 2020-11-04 DIAGNOSIS — I10 Essential (primary) hypertension: Secondary | ICD-10-CM

## 2020-11-09 ENCOUNTER — Ambulatory Visit: Payer: BC Managed Care – PPO | Admitting: Family Medicine

## 2020-11-09 NOTE — Progress Notes (Addendum)
[place] at [facility] [address], [place], [place] [address] [phone]  Date:  11/12/2020   Name:  Natalie Richard   DOB:  [birth year]   MRN:  [id number]  PCP:  Darreld Mclean, MD    Chief Complaint: Diabetes and Anxiety   History of Present Illness:  Natalie Richard is a 55 y.o. very pleasant female patient who presents with the following:  Patient here today for short-term follow-up visit.  Last seen by myself in October-virtual visit also in January due to COVID-19.  She feels like she is back to normal from this perspective  History of uncontrolled diabetes, hypertension, Nash, obesity, hyperlipidemia, insomnia/anxiety and depression Back in October she had not been taking her diabetes medications consistently and A1c was 12.5% We had her gradually increase her dose of insulin, start back on Actos.  She does not tolerate Metformin Taking Trulicity consistently She notes that her fasting sugars are still in the 200s She is forgetting to take her actos at night   Lab Results  Component Value Date   HGBA1C 12.5 (H) 07/06/2020   Pap smear-patient had hysterectomy in 2004 Mammogram- she will update  Eye exam; this is due, reminded pt  COVID-19 booster- encouraged her to do this  Flu vaccine- not done  Tetanus booster is due- pt declines   Actos 30 mg daily- not taking Trulicity 1.5 Basaglar; she is taking 36 units Lisinopril/HCTZ Crestor- not taking, she keeps forgetting but will try to Citalopram Clonazepam  Her psychiatrist has changed his practice- they are no longer rx medication.  She does have a new doctor lined up but will not see them until March  She takes citalopram 40 Also clonazepam once a day- she had been on BID but now taking once a day She notes more anxiety recently- she is not quite sure why this happened She was on abilify previously which helped but it caused issues with her glucose so  she is no longer taking it Married to [employer], she admits that he likes to go out drinking several nights a week.  In order to spend time with him she has been going out with him as well and ends up drinking alcohol which she would prefer not to do She feels like she is craving alcohol again which she had not done in a long time - she is going out up to 4x a week right now and might have 5 drinks on these days She denies any suicidal ideation She is motivated to stop drinking, and finds other things to keep her busy Patient Active Problem List   Diagnosis Date Noted   History of squamous cell carcinoma in situ (SCCIS) of skin 12/11/2019   NASH (nonalcoholic steatohepatitis) 07/26/2015   BMI 39.0-39.9,adult 03/19/2012   Hematoma 03/19/2012   HTN (hypertension), benign 01/09/2012   Osteopenia 01/09/2012   Migraines 01/09/2012   Insomnia 01/09/2012   Anxiety and depression 01/09/2012   Hyperlipidemia 01/09/2012   Diabetes mellitus 01/09/2012   Allergic rhinitis 01/09/2012    Past Medical History:  Diagnosis Date   Anxiety    Cancer ([place])    skin- Right shin squamous cell   Depression    Diabetes mellitus    Diabetes mellitus, type II ([place])    GERD (gastroesophageal reflux disease)    Hematoma    on buttock   Hyperlipidemia    Hypertension    Migraine  Seasonal allergies     Past Surgical History:  Procedure Laterality Date   ABDOMINAL HYSTERECTOMY  2004   PCOS   EYE SURGERY     INNER EAR SURGERY Left 08/2016    Social History   Tobacco Use   Smoking status: Never Smoker   Smokeless tobacco: Never Used  Vaping Use   Vaping Use: Never used  Substance Use Topics   Alcohol use: Not Currently    Alcohol/week: 8.0 - 10.0 standard drinks    Types: 6 Cans of beer, 2 Shots of liquor per week   Drug use: No    Family History  Problem Relation Age of Onset   Cancer Mother 63       colon   Diabetes Mother    Rheum arthritis Mother     Dementia Mother    Depression Mother    Cancer Father 28       bone and prostate   Cancer Maternal Grandmother        breast   Diabetes Maternal Grandfather    Depression Cousin     Allergies  Allergen Reactions   Buspar [Buspirone] Other (See Comments)    Lip swelling and rash numbness    Augmentin [Amoxicillin-Pot Clavulanate] Nausea And Vomiting   Biaxin [Clarithromycin]    Ceftin [Cefuroxime] Swelling    Eye swelling, shortness of breath    Ciprofloxacin     Developed itching with IV form in the hospital, but has since taken PO without a problem   Dapagliflozin Other (See Comments)    Burning upon urination. Increased blood sugar.   Doxycycline Itching   Levaquin [Levofloxacin] Hives   Metformin And Related Nausea And Vomiting   Phenazopyridine Hcl Other (See Comments)    Does not remember reaction   Pyridium [Phenazopyridine Hcl]     Does not remember reaction   Sulfa Antibiotics     As a younger person had itching, but has taken more recently and did ok   Latex Rash   Lorabid [Loracarbef] Rash    Medication list has been reviewed and updated.  Current Outpatient Medications on File Prior to Visit  Medication Sig Dispense Refill   BD PEN NEEDLE NANO U/F 32G X 4 MM MISC USE AS DIRECTED 100 each 3   citalopram (CELEXA) 40 MG tablet Take 1 tablet (40 mg total) by mouth daily. 90 tablet 3   DEXILANT 60 MG capsule TAKE 1 CAPSULE BY MOUTH EVERY DAY 90 capsule 1   glucose blood (ONETOUCH VERIO) test strip Use as instructed- may check blood sugar up to twice a day. 100 each 12   glucose blood test strip Use to check glucose 1-2x daily 100 each 12   Insulin Glargine (BASAGLAR KWIKPEN) 100 UNIT/ML START WITH 10 UNITS DAILY, INCREASE AS DIRECTED BY MD. ESTIMATE FINAL DOSE WILL BE 25 UNITS DAILY 24 mL 7   lisinopril-hydrochlorothiazide (ZESTORETIC) 10-12.5 MG tablet TAKE 1/2 TABLET ONCE A DAY. MAY INCREASE TO WHOLE TABLET IF DIRECTED BY MD 90  tablet 3   Loratadine 10 MG CAPS Take by mouth.     Multiple Vitamins-Minerals (MULTIVITAMIN WITH MINERALS) tablet Take 1 tablet by mouth daily.     TRULICITY 1.5 QH/4.7ML SOPN INJECT 1.5 MG PER WEEK FOR DIABETES 6 pen 2   clonazePAM (KLONOPIN) 0.5 MG tablet Take 1 tablet (0.5 mg total) by mouth 2 (two) times daily. (Patient taking differently: Take 0.5 mg by mouth daily. ) 180 tablet 0   pioglitazone (ACTOS) 30 MG  tablet Take 1 tablet (30 mg total) by mouth daily. (Patient not taking: Reported on 11/12/2020) 90 tablet 3   rosuvastatin (CRESTOR) 40 MG tablet TAKE 1 TABLET BY MOUTH EVERY DAY (Patient not taking: Reported on 11/12/2020) 90 tablet 1   No current facility-administered medications on file prior to visit.    Review of Systems:  As per HPI- otherwise negative.   Physical Examination: Vitals:   11/12/20 0922  BP: (!) 142/92  Pulse: 91  Resp: 17  SpO2: 99%   Vitals:   11/12/20 0922  Weight: 205 lb (93 kg)  Height: 5' 6"  (1.676 m)   Body mass index is 33.09 kg/m. Ideal Body Weight: Weight in (lb) to have BMI = 25: 154.6  GEN: no acute distress.  Obese, otherwise looks well HEENT: Atraumatic, Normocephalic.  Ears and Nose: No external deformity. CV: RRR, No M/G/R. No JVD. No thrill. No extra heart sounds. PULM: CTA B, no wheezes, crackles, rhonchi. No retractions. No resp. distress. No accessory muscle use. ABD: S, NT, ND, +BS. No rebound. No HSM. EXTR: No c/c/e PSYCH: Normally interactive. Conversant.    Assessment and Plan: HTN (hypertension), benign - Plan: Basic metabolic panel, CBC  Mixed hyperlipidemia  Type 2 diabetes mellitus without complication, without long-term current use of insulin (HCC) - Plan: Hemoglobin A1c  NASH (nonalcoholic steatohepatitis)  Alcohol abuse  Depression with anxiety  Patient is here today for follow-up visit. Blood pressures under reasonable control She is using insulin again, but notes blood sugars remain high.  We  will check A1c today and make adjustments as necessary We discussed strategies to help her remember her evening medications (Actos and statin) O[place]ia admitted today that she has been drinking alcohol again, it sounds like she and her husband have a dysfunctional relationship with alcohol.  Thankfully, she does have good insight into this issue.  We discussed something she could do in the evenings besides going out drinking, she also plans to try discussed this with her husband and see if they can plan some alternative activities  She will be establishing care with a new psychiatrist in about 3 weeks, so I hesitate to make too many changes.  She has used clonazepam twice daily in the past, she still has about 2 weeks of medication at home from last prescription.  I advised her she may increase her clonazepam from once daily to twice daily, but she must not combine her dose with alcohol.  She states agreement and that she would be able to abide by this rule.  She will update me about how she is feeling in about 1 week  This visit occurred during the SARS-CoV-2 public health emergency.  Safety protocols were in place, including screening questions prior to the visit, additional usage of staff PPE, and extensive cleaning of exam room while observing appropriate contact time as indicated for disinfecting solutions.    Signed Lamar Blinks, MD  Received her labs 2/11- pt does not have mychart.so letter will be needed She has stopped taking her actos- plan to have her re-start this medication   Results for orders placed or performed in visit on 86/76/72  Basic metabolic panel  Result Value Ref Range   Sodium 139 135 - 145 mEq/L   Potassium 4.0 3.5 - 5.1 mEq/L   Chloride 97 96 - 112 mEq/L   CO2 29 19 - 32 mEq/L   Glucose, Bld 204 (H) 70 - 99 mg/dL   BUN 8 6 - 23 mg/dL  Creatinine, Ser 0.77 0.40 - 1.20 mg/dL   GFR 86.27 >60.00 mL/min   Calcium 9.4 8.4 - 10.5 mg/dL  Hemoglobin A1c  Result Value  Ref Range   Hgb A1c MFr Bld 10.4 (H) 4.6 - 6.5 %  CBC  Result Value Ref Range   WBC 6.5 4.0 - 10.5 K/uL   RBC 5.33 (H) 3.87 - 5.11 Mil/uL   Platelets 315.0 150.0 - 400.0 K/uL   Hemoglobin 14.9 12.0 - 15.0 g/dL   HCT 44.1 36.0 - 46.0 %   MCV 82.8 78.0 - 100.0 fl   MCHC 33.8 30.0 - 36.0 g/dL   RDW 14.8 11.5 - 15.5 %

## 2020-11-09 NOTE — Patient Instructions (Incomplete)
It was good to see you again today, I will be in touch with your labs as soon as possible I am sorry that you are struggling right now.  I do agree that drinking is probably worsening your depression Please talk with your partner about other activities you can do together besides drinking If he is not able to change right now I would encourage you to look at some other options (like exercise) to do in the evening.  You might join a gym or sign up for a class such as yoga or something else that interests you.  It is wonderful that you are thinking about making these changes!   Please work on taking your Actos and cholesterol med on a regular basis Ok to take clonazepam twice a day right now for anxiety- however, do NOT take your evening dose if you are drinking.  Please let me know how this is working out for you over the next week or so

## 2020-11-11 ENCOUNTER — Ambulatory Visit: Payer: BC Managed Care – PPO | Admitting: Family Medicine

## 2020-11-12 ENCOUNTER — Encounter: Payer: Self-pay | Admitting: Family Medicine

## 2020-11-12 ENCOUNTER — Ambulatory Visit (INDEPENDENT_AMBULATORY_CARE_PROVIDER_SITE_OTHER): Payer: BC Managed Care – PPO | Admitting: Family Medicine

## 2020-11-12 ENCOUNTER — Ambulatory Visit: Payer: BC Managed Care – PPO | Admitting: Family Medicine

## 2020-11-12 ENCOUNTER — Other Ambulatory Visit: Payer: Self-pay

## 2020-11-12 VITALS — BP 142/92 | HR 91 | Resp 17 | Ht 66.0 in | Wt 205.0 lb

## 2020-11-12 DIAGNOSIS — F418 Other specified anxiety disorders: Secondary | ICD-10-CM

## 2020-11-12 DIAGNOSIS — E119 Type 2 diabetes mellitus without complications: Secondary | ICD-10-CM | POA: Diagnosis not present

## 2020-11-12 DIAGNOSIS — K7581 Nonalcoholic steatohepatitis (NASH): Secondary | ICD-10-CM | POA: Diagnosis not present

## 2020-11-12 DIAGNOSIS — E782 Mixed hyperlipidemia: Secondary | ICD-10-CM

## 2020-11-12 DIAGNOSIS — I10 Essential (primary) hypertension: Secondary | ICD-10-CM

## 2020-11-12 DIAGNOSIS — F1010 Alcohol abuse, uncomplicated: Secondary | ICD-10-CM

## 2020-11-12 LAB — BASIC METABOLIC PANEL
BUN: 8 mg/dL (ref 6–23)
CO2: 29 mEq/L (ref 19–32)
Calcium: 9.4 mg/dL (ref 8.4–10.5)
Chloride: 97 mEq/L (ref 96–112)
Creatinine, Ser: 0.77 mg/dL (ref 0.40–1.20)
GFR: 86.27 mL/min (ref >60.00)
Glucose, Bld: 204 mg/dL — ABNORMAL HIGH (ref 70–99)
Potassium: 4 mEq/L (ref 3.5–5.1)
Sodium: 139 mEq/L (ref 135–145)

## 2020-11-12 LAB — CBC
HCT: 44.1 % (ref 36.0–46.0)
Hemoglobin: 14.9 g/dL (ref 12.0–15.0)
MCHC: 33.8 g/dL (ref 30.0–36.0)
MCV: 82.8 fl (ref 78.0–100.0)
Platelets: 315 10*3/uL (ref 150.0–400.0)
RBC: 5.33 Mil/uL — ABNORMAL HIGH (ref 3.87–5.11)
RDW: 14.8 % (ref 11.5–15.5)
WBC: 6.5 10*3/uL (ref 4.0–10.5)

## 2020-11-12 LAB — HEMOGLOBIN A1C: Hgb A1c MFr Bld: 10.4 % — ABNORMAL HIGH (ref 4.6–6.5)

## 2020-11-17 ENCOUNTER — Other Ambulatory Visit: Payer: Self-pay | Admitting: Family Medicine

## 2020-11-17 DIAGNOSIS — F410 Panic disorder [episodic paroxysmal anxiety] without agoraphobia: Secondary | ICD-10-CM

## 2020-11-17 DIAGNOSIS — F3341 Major depressive disorder, recurrent, in partial remission: Secondary | ICD-10-CM

## 2020-11-17 MED ORDER — CLONAZEPAM 0.5 MG PO TABS: 0.5000 mg | ORAL_TABLET | Freq: Two times a day (BID) | ORAL | 1 refills | Status: DC

## 2020-11-17 NOTE — Telephone Encounter (Signed)
Patient states she is doing better with medication increase on clonazePAM (KLONOPIN) 0.5 MG tablet  And would like a refill to hold her until she see physiologist.

## 2020-11-17 NOTE — Telephone Encounter (Signed)
Called pt to check on her- she is taking klonopin BID and doing better.  psychiatry appt scheduled for 3/4 Refilled as below  Meds ordered this encounter  Medications  . clonazePAM (KLONOPIN) 0.5 MG tablet    Sig: Take 1 tablet (0.5 mg total) by mouth 2 (two) times daily.    Dispense:  60 tablet    Refill:  1

## 2020-11-19 ENCOUNTER — Other Ambulatory Visit: Payer: Self-pay | Admitting: Family Medicine

## 2020-11-19 DIAGNOSIS — R809 Proteinuria, unspecified: Secondary | ICD-10-CM

## 2020-11-28 ENCOUNTER — Other Ambulatory Visit: Payer: Self-pay | Admitting: Family Medicine

## 2020-11-28 DIAGNOSIS — K219 Gastro-esophageal reflux disease without esophagitis: Secondary | ICD-10-CM

## 2020-12-02 ENCOUNTER — Other Ambulatory Visit: Payer: Self-pay | Admitting: Family Medicine

## 2020-12-02 DIAGNOSIS — F3341 Major depressive disorder, recurrent, in partial remission: Secondary | ICD-10-CM

## 2020-12-02 DIAGNOSIS — F410 Panic disorder [episodic paroxysmal anxiety] without agoraphobia: Secondary | ICD-10-CM

## 2020-12-04 ENCOUNTER — Other Ambulatory Visit: Payer: Self-pay

## 2020-12-04 ENCOUNTER — Ambulatory Visit (INDEPENDENT_AMBULATORY_CARE_PROVIDER_SITE_OTHER): Payer: BC Managed Care – PPO | Admitting: Adult Health

## 2020-12-04 ENCOUNTER — Encounter: Payer: Self-pay | Admitting: Adult Health

## 2020-12-04 VITALS — BP 128/79 | HR 91 | Ht 66.0 in | Wt 211.0 lb

## 2020-12-04 DIAGNOSIS — F410 Panic disorder [episodic paroxysmal anxiety] without agoraphobia: Secondary | ICD-10-CM

## 2020-12-04 DIAGNOSIS — F331 Major depressive disorder, recurrent, moderate: Secondary | ICD-10-CM | POA: Diagnosis not present

## 2020-12-04 DIAGNOSIS — F411 Generalized anxiety disorder: Secondary | ICD-10-CM | POA: Diagnosis not present

## 2020-12-04 DIAGNOSIS — F3341 Major depressive disorder, recurrent, in partial remission: Secondary | ICD-10-CM

## 2020-12-04 DIAGNOSIS — F428 Other obsessive-compulsive disorder: Secondary | ICD-10-CM | POA: Diagnosis not present

## 2020-12-04 MED ORDER — CLONAZEPAM 0.5 MG PO TABS: 0.5000 mg | ORAL_TABLET | Freq: Two times a day (BID) | ORAL | 1 refills | Status: DC

## 2020-12-04 MED ORDER — CITALOPRAM HYDROBROMIDE 40 MG PO TABS: 40.0000 mg | ORAL_TABLET | Freq: Every day | ORAL | 1 refills | Status: DC
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
## 2020-12-15 ENCOUNTER — Other Ambulatory Visit: Payer: Self-pay | Admitting: Family Medicine

## 2020-12-15 ENCOUNTER — Telehealth: Payer: Self-pay | Admitting: Family Medicine

## 2020-12-15 DIAGNOSIS — E1165 Type 2 diabetes mellitus with hyperglycemia: Secondary | ICD-10-CM

## 2020-12-15 DIAGNOSIS — F5101 Primary insomnia: Secondary | ICD-10-CM

## 2020-12-15 NOTE — Telephone Encounter (Signed)
Patient would like a sleep apnea referral

## 2020-12-25 ENCOUNTER — Telehealth: Payer: Self-pay | Admitting: Adult Health

## 2020-12-25 NOTE — Telephone Encounter (Signed)
Yes, that is fine. 

## 2020-12-25 NOTE — Telephone Encounter (Signed)
Hi Natalie Richard, Natalie Richard [facility] called for samples of Rexulti 0.58m ok to pull?

## 2021-01-02 ENCOUNTER — Other Ambulatory Visit: Payer: Self-pay | Admitting: Family Medicine

## 2021-01-02 DIAGNOSIS — E119 Type 2 diabetes mellitus without complications: Secondary | ICD-10-CM

## 2021-01-05 ENCOUNTER — Telehealth: Payer: Self-pay

## 2021-01-05 NOTE — Telephone Encounter (Signed)
We received a voicemail from this patient wanting to schedule her appointment. I gave her a call back to transfer her to the sleep lab, but had to leave a voicemail. I left her the main office number.

## 2021-01-06 ENCOUNTER — Ambulatory Visit (INDEPENDENT_AMBULATORY_CARE_PROVIDER_SITE_OTHER): Payer: BC Managed Care – PPO | Admitting: Adult Health

## 2021-01-06 ENCOUNTER — Encounter: Payer: Self-pay | Admitting: Adult Health

## 2021-01-06 ENCOUNTER — Other Ambulatory Visit: Payer: Self-pay

## 2021-01-06 DIAGNOSIS — F410 Panic disorder [episodic paroxysmal anxiety] without agoraphobia: Secondary | ICD-10-CM | POA: Diagnosis not present

## 2021-01-06 DIAGNOSIS — F331 Major depressive disorder, recurrent, moderate: Secondary | ICD-10-CM | POA: Diagnosis not present

## 2021-01-06 DIAGNOSIS — F411 Generalized anxiety disorder: Secondary | ICD-10-CM

## 2021-01-06 DIAGNOSIS — F3341 Major depressive disorder, recurrent, in partial remission: Secondary | ICD-10-CM

## 2021-01-06 DIAGNOSIS — F428 Other obsessive-compulsive disorder: Secondary | ICD-10-CM

## 2021-01-06 MED ORDER — REXULTI 0.5 MG PO TABS: 0.5000 mg | ORAL_TABLET | Freq: Every day | ORAL | 5 refills | Status: DC

## 2021-01-06 MED ORDER — CLONAZEPAM 0.5 MG PO TABS: 0.5000 mg | ORAL_TABLET | Freq: Two times a day (BID) | ORAL | 2 refills | Status: DC

## 2021-01-06 NOTE — Addendum Note (Signed)
Addended by: Aloha Gell on: 01/06/2021 03:12 PM   Modules accepted: Orders
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
## 2021-02-06 NOTE — Progress Notes (Addendum)
[facility] [place], [place], [place] [address] [phone]  Date:  02/10/2021   Name:  Natalie Richard   DOB:  [birth year]   MRN:  [id number]  PCP:  Darreld Mclean, MD    Chief Complaint: Hypertension and Diabetes   History of Present Illness:  Natalie Richard is a 54 y.o. very pleasant female patient who presents with the following:  Here today for a short term follow-up visit Last seen by myself in February of this year - History of uncontrolled diabetes, hypertension, [place], obesity, hyperlipidemia, insomnia/anxiety and depression  Lab Results  Component Value Date   HGBA1C 10.4 (H) 11/12/2020   A1c 12.5 in October   In February Patria was struggling with alcohol overuse and depression  She has made significant progress since our last visit- she is feeling better She is less depressed and is drinking less She notes that her glucose is under better control She has gained some weight, but she has more appetite She is now using 32 units of basaglar as she was getting some lows  For diabetes, she is also using trulicity 1.5 weekly and actos 66m   [facility]- she likes her a lot  They added Rexulti to her regimen and this is helping a lot  She is also continuing celexa and klonopin  She notes that she may drink alcohol a couple of times a week, but no longer feels that this is an escape mechanism and she is drinking a lot less  Not yet getting formal exercise  Wt Readings from Last 3 Encounters:  02/10/21 222 lb (100.7 kg)  11/12/20 205 lb (93 kg)  07/06/20 209 lb (94.8 kg)     Patient Active Problem List   Diagnosis Date Noted  . History of squamous cell carcinoma in situ (SCCIS) of skin 12/11/2019  . NASH (nonalcoholic steatohepatitis) 07/26/2015  . BMI 39.0-39.9,adult 03/19/2012  . Hematoma 03/19/2012  . HTN (hypertension),  benign 01/09/2012  . Osteopenia 01/09/2012  . Migraines 01/09/2012  . Insomnia 01/09/2012  . Anxiety and depression 01/09/2012  . Hyperlipidemia 01/09/2012  . Diabetes mellitus 01/09/2012  . Allergic rhinitis 01/09/2012    Past Medical History:  Diagnosis Date  . Anxiety   . 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 Depression   . Diabetes mellitus   . Diabetes mellitus, type II ([place]   . GERD (gastroesophageal reflux disease)   . Hematoma    on buttock  . Hyperlipidemia   . Hypertension   . Migraine   . Seasonal allergies     Past Surgical History:  Procedure Laterality Date  . ABDOMINAL HYSTERECTOMY  2004   PCOS  . EYE SURGERY    . INNER EAR SURGERY Left 08/2016    Social History   Tobacco Use  . Smoking status: Never Smoker  . Smokeless tobacco: Never Used  Vaping Use  . Vaping Use: Never used  Substance Use Topics  . Alcohol use: Not Currently    Alcohol/week: 8.0 - 10.0 standard drinks    Types: 6 Cans of beer, 2 Shots of liquor per week  . Drug use: No    Family History  Problem Relation Age of Onset  . Cancer Mother 545      colon  . Diabetes Mother   . Rheum arthritis Mother   .  Dementia Mother   . Depression Mother   . Cancer Father 75       bone and prostate  . Cancer Maternal Grandmother        breast  . Diabetes Maternal Grandfather   . Depression Cousin     Allergies  Allergen Reactions  . Buspar [Buspirone] Other (See Comments)    Lip swelling and rash numbness   . Augmentin [Amoxicillin-Pot Clavulanate] Nausea And Vomiting  . Biaxin [Clarithromycin]   . Ceftin [Cefuroxime] Swelling    Eye swelling, shortness of breath   . Ciprofloxacin     Developed itching with IV form in the hospital, but has since taken PO without a problem  . Dapagliflozin Other (See Comments)    Burning upon urination. Increased blood sugar.  . Doxycycline Itching  . Levaquin [Levofloxacin] Hives  . Metformin And Related Nausea And Vomiting   . Phenazopyridine Hcl Other (See Comments)    Does not remember reaction  . Pyridium [Phenazopyridine Hcl]     Does not remember reaction  . Sulfa Antibiotics     As a younger person had itching, but has taken more recently and did ok  . Latex Rash  . Lorabid [Loracarbef] Rash    Medication list has been reviewed and updated.  Current Outpatient Medications on File Prior to Visit  Medication Sig Dispense Refill  . BD PEN NEEDLE NANO 2ND GEN 32G X 4 MM MISC USE AS DIRECTED 100 each 3  . Brexpiprazole (REXULTI) 0.5 MG TABS Take 1 tablet (0.5 mg total) by mouth daily. 30 tablet 5  . citalopram (CELEXA) 40 MG tablet Take 1 tablet (40 mg total) by mouth daily. 90 tablet 1  . clonazePAM (KLONOPIN) 0.5 MG tablet Take 1 tablet (0.5 mg total) by mouth 2 (two) times daily. 60 tablet 2  . DEXILANT 60 MG capsule TAKE 1 CAPSULE BY MOUTH EVERY DAY 90 capsule 1  . glucose blood (ONETOUCH VERIO) test strip Use as instructed- may check blood sugar up to twice a day. 100 each 12  . glucose blood test strip Use to check glucose 1-2x daily 100 each 12  . Insulin Glargine (BASAGLAR KWIKPEN) 100 UNIT/ML START WITH 10 UNITS DAILY, INCREASE AS DIRECTED BY MD. ESTIMATE FINAL DOSE WILL BE 25 UNITS DAILY 24 mL 7  . lisinopril-hydrochlorothiazide (ZESTORETIC) 10-12.5 MG tablet TAKE 1/2 TABLET ONCE A DAY. MAY INCREASE TO WHOLE TABLET IF DIRECTED BY MD 90 tablet 1  . Loratadine 10 MG CAPS Take by mouth.    . Multiple Vitamins-Minerals (MULTIVITAMIN WITH MINERALS) tablet Take 1 tablet by mouth daily.    . pioglitazone (ACTOS) 30 MG tablet Take 1 tablet (30 mg total) by mouth daily. 90 tablet 0  . rosuvastatin (CRESTOR) 40 MG tablet TAKE 1 TABLET BY MOUTH EVERY DAY 90 tablet 1  . TRULICITY 1.5 QM/0.8QP SOPN INJECT 1.5 MG PER WEEK FOR DIABETES 6 pen 2   No current facility-administered medications on file prior to visit.    Review of Systems:  As per HPI- otherwise negative.   Physical Examination: Vitals:    02/10/21 0936  BP: 120/80  Pulse: 80  Resp: 17  Temp: 98.3 F (36.8 C)  SpO2: 99%   Vitals:   02/10/21 0936  Weight: 222 lb (100.7 kg)  Height: 5' 6"  (1.676 m)   Body mass index is 35.83 kg/m. Ideal Body Weight: Weight in (lb) to have BMI = 25: 154.6  GEN: no acute distress.  Obese, looks well HEENT:  Atraumatic, Normocephalic.  Ears and Nose: No external deformity. CV: RRR, No M/G/R. No JVD. No thrill. No extra heart sounds. PULM: CTA B, no wheezes, crackles, rhonchi. No retractions. No resp. distress. No accessory muscle use. EXTR: No c/c/e PSYCH: Normally interactive. Conversant.    Assessment and Plan: HTN (hypertension), benign - Plan: Comprehensive metabolic panel  Mixed hyperlipidemia  Type 2 diabetes mellitus without complication, without long-term current use of insulin (HCC) - Plan: Hemoglobin A1c, Comprehensive metabolic panel  NASH (nonalcoholic steatohepatitis) - Plan: Comprehensive metabolic panel  Depression with anxiety  Immunization due - Plan: Td vaccine greater than or equal to 7yo preservative free IM  Following up today.  Joleena is thankfully in a much better place, she is seeing a new mental health provider who has altered her medications.  She is also changed her lifestyle and notes reduction in her depression and anxiety symptoms  Her blood pressure is under good control  She has gained some weight, but suspect she may have been artificially low due to high blood sugars at past visit.  She also notes improvement in her appetite.  Follow-up pending A1c  Tetanus booster given This visit occurred during the SARS-CoV-2 public health emergency.  Safety protocols were in place, including screening questions prior to the visit, additional usage of staff PPE, and extensive cleaning of exam room while observing appropriate contact time as indicated for disinfecting solutions.    Signed Lamar Blinks, MD  Received her labs as below, letter to  patient  Results for orders placed or performed in visit on 02/10/21  Hemoglobin A1c  Result Value Ref Range   Hgb A1c MFr Bld 8.2 (H) 4.6 - 6.5 %  Comprehensive metabolic panel  Result Value Ref Range   Sodium 137 135 - 145 mEq/L   Potassium 4.4 3.5 - 5.1 mEq/L   Chloride 101 96 - 112 mEq/L   CO2 27 19 - 32 mEq/L   Glucose, Bld 135 (H) 70 - 99 mg/dL   BUN 23 6 - 23 mg/dL   Creatinine, Ser 0.89 0.40 - 1.20 mg/dL   Total Bilirubin 0.4 0.2 - 1.2 mg/dL   Alkaline Phosphatase 121 (H) 39 - 117 U/L   AST 36 0 - 37 U/L   ALT 35 0 - 35 U/L   Total Protein 6.8 6.0 - 8.3 g/dL   Albumin 4.1 3.5 - 5.2 g/dL   GFR 72.38 >60.00 mL/min   Calcium 9.3 8.4 - 10.5 mg/dL

## 2021-02-07 ENCOUNTER — Other Ambulatory Visit: Payer: Self-pay | Admitting: Family Medicine

## 2021-02-07 DIAGNOSIS — E785 Hyperlipidemia, unspecified: Secondary | ICD-10-CM

## 2021-02-10 ENCOUNTER — Ambulatory Visit (INDEPENDENT_AMBULATORY_CARE_PROVIDER_SITE_OTHER): Payer: BC Managed Care – PPO | Admitting: Family Medicine

## 2021-02-10 ENCOUNTER — Other Ambulatory Visit: Payer: Self-pay

## 2021-02-10 ENCOUNTER — Encounter: Payer: Self-pay | Admitting: Family Medicine

## 2021-02-10 VITALS — BP 120/80 | HR 80 | Temp 98.3°F | Resp 17 | Ht 66.0 in | Wt 222.0 lb

## 2021-02-10 DIAGNOSIS — E782 Mixed hyperlipidemia: Secondary | ICD-10-CM | POA: Diagnosis not present

## 2021-02-10 DIAGNOSIS — K7581 Nonalcoholic steatohepatitis (NASH): Secondary | ICD-10-CM

## 2021-02-10 DIAGNOSIS — I10 Essential (primary) hypertension: Secondary | ICD-10-CM

## 2021-02-10 DIAGNOSIS — E119 Type 2 diabetes mellitus without complications: Secondary | ICD-10-CM

## 2021-02-10 DIAGNOSIS — Z23 Encounter for immunization: Secondary | ICD-10-CM | POA: Diagnosis not present

## 2021-02-10 DIAGNOSIS — F418 Other specified anxiety disorders: Secondary | ICD-10-CM

## 2021-02-10 LAB — COMPREHENSIVE METABOLIC PANEL
ALT: 35 U/L (ref 0–35)
AST: 36 U/L (ref 0–37)
Albumin: 4.1 g/dL (ref 3.5–5.2)
Alkaline Phosphatase: 121 U/L — ABNORMAL HIGH (ref 39–117)
BUN: 23 mg/dL (ref 6–23)
CO2: 27 mEq/L (ref 19–32)
Calcium: 9.3 mg/dL (ref 8.4–10.5)
Chloride: 101 mEq/L (ref 96–112)
Creatinine, Ser: 0.89 mg/dL (ref 0.40–1.20)
GFR: 72.38 mL/min (ref >60.00)
Glucose, Bld: 135 mg/dL — ABNORMAL HIGH (ref 70–99)
Potassium: 4.4 mEq/L (ref 3.5–5.1)
Sodium: 137 mEq/L (ref 135–145)
Total Bilirubin: 0.4 mg/dL (ref 0.2–1.2)
Total Protein: 6.8 g/dL (ref 6.0–8.3)

## 2021-02-10 LAB — HEMOGLOBIN A1C: Hgb A1c MFr Bld: 8.2 % — ABNORMAL HIGH (ref 4.6–6.5)

## 2021-02-10 NOTE — Patient Instructions (Addendum)
It was good to see you again today- it seems like you have made wonderful progress!  I will be in touch with your labs asap Next step is to add in exercise!    You got your tetanus booster today

## 2021-02-12 ENCOUNTER — Telehealth: Payer: Self-pay

## 2021-02-12 NOTE — Telephone Encounter (Signed)
Pt called in regards to her results. Results given and advised that letter was sent.  Pt verbalized understanding. -JMA

## 2021-02-16 ENCOUNTER — Ambulatory Visit: Payer: BC Managed Care – PPO | Admitting: Neurology

## 2021-02-16 ENCOUNTER — Encounter: Payer: Self-pay | Admitting: Neurology

## 2021-02-16 VITALS — BP 116/80 | HR 75 | Ht 65.0 in | Wt 220.0 lb

## 2021-02-16 DIAGNOSIS — F39 Unspecified mood [affective] disorder: Secondary | ICD-10-CM

## 2021-02-16 DIAGNOSIS — R0683 Snoring: Secondary | ICD-10-CM | POA: Diagnosis not present

## 2021-02-16 DIAGNOSIS — R351 Nocturia: Secondary | ICD-10-CM

## 2021-02-16 DIAGNOSIS — G4719 Other hypersomnia: Secondary | ICD-10-CM

## 2021-02-16 DIAGNOSIS — R519 Headache, unspecified: Secondary | ICD-10-CM | POA: Diagnosis not present

## 2021-02-16 DIAGNOSIS — E669 Obesity, unspecified: Secondary | ICD-10-CM

## 2021-02-16 NOTE — Patient Instructions (Signed)
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
## 2021-02-21 ENCOUNTER — Other Ambulatory Visit: Payer: Self-pay | Admitting: Family Medicine

## 2021-02-21 DIAGNOSIS — K219 Gastro-esophageal reflux disease without esophagitis: Secondary | ICD-10-CM

## 2021-02-23 ENCOUNTER — Other Ambulatory Visit: Payer: Self-pay | Admitting: Family Medicine

## 2021-02-23 ENCOUNTER — Telehealth: Payer: Self-pay | Admitting: Family Medicine

## 2021-02-23 DIAGNOSIS — E1165 Type 2 diabetes mellitus with hyperglycemia: Secondary | ICD-10-CM

## 2021-02-23 MED ORDER — PANTOPRAZOLE SODIUM 40 MG PO TBEC
40.0000 mg | DELAYED_RELEASE_TABLET | Freq: Every day | ORAL | 3 refills | Status: DC
Start: 2021-02-23 — End: 2022-02-14

## 2021-02-23 NOTE — Telephone Encounter (Signed)
Called pt- will sub protonix

## 2021-02-23 NOTE — Telephone Encounter (Signed)
Patient states Dexilant generic is $114 & brand name is $170. Patient is wondering if there is something cheaper you could prescribe.

## 2021-03-02 ENCOUNTER — Other Ambulatory Visit: Payer: Self-pay | Admitting: Adult Health

## 2021-03-02 DIAGNOSIS — F331 Major depressive disorder, recurrent, moderate: Secondary | ICD-10-CM

## 2021-03-03 ENCOUNTER — Other Ambulatory Visit: Payer: Self-pay | Admitting: Family Medicine

## 2021-03-31 ENCOUNTER — Other Ambulatory Visit: Payer: Self-pay | Admitting: Family Medicine

## 2021-03-31 DIAGNOSIS — E1165 Type 2 diabetes mellitus with hyperglycemia: Secondary | ICD-10-CM

## 2021-04-02 ENCOUNTER — Other Ambulatory Visit: Payer: Self-pay | Admitting: Family Medicine

## 2021-04-02 DIAGNOSIS — E119 Type 2 diabetes mellitus without complications: Secondary | ICD-10-CM

## 2021-04-07 ENCOUNTER — Encounter: Payer: Self-pay | Admitting: Adult Health

## 2021-04-07 ENCOUNTER — Other Ambulatory Visit: Payer: Self-pay

## 2021-04-07 ENCOUNTER — Ambulatory Visit (INDEPENDENT_AMBULATORY_CARE_PROVIDER_SITE_OTHER): Payer: BC Managed Care – PPO | Admitting: Adult Health

## 2021-04-07 DIAGNOSIS — F411 Generalized anxiety disorder: Secondary | ICD-10-CM | POA: Diagnosis not present

## 2021-04-07 DIAGNOSIS — F410 Panic disorder [episodic paroxysmal anxiety] without agoraphobia: Secondary | ICD-10-CM | POA: Diagnosis not present

## 2021-04-07 DIAGNOSIS — F3341 Major depressive disorder, recurrent, in partial remission: Secondary | ICD-10-CM

## 2021-04-07 DIAGNOSIS — F428 Other obsessive-compulsive disorder: Secondary | ICD-10-CM | POA: Diagnosis not present

## 2021-04-07 DIAGNOSIS — F331 Major depressive disorder, recurrent, moderate: Secondary | ICD-10-CM

## 2021-04-07 MED ORDER — REXULTI 0.5 MG PO TABS: 1.0000 | ORAL_TABLET | Freq: Every day | ORAL | 5 refills | Status: DC

## 2021-04-07 MED ORDER — CITALOPRAM HYDROBROMIDE 40 MG PO TABS: 40.0000 mg | ORAL_TABLET | Freq: Every day | ORAL | 3 refills | Status: DC

## 2021-04-07 MED ORDER — CLONAZEPAM 0.5 MG PO TABS: 0.5000 mg | ORAL_TABLET | Freq: Two times a day (BID) | ORAL | 2 refills | Status: DC

## 2021-04-07 NOTE — Progress Notes (Signed)
SHARLEY KEELER [id number] 1965/10/05 55 y.o.  Subjective:   Patient ID:  Natalie Richard is a 55 y.o. (DOB [birth year]) female.  Chief Complaint: No chief complaint on file.   HPI Natalie Richard presents to the office today for follow-up of MDD, GAD, obsesional thoughts, panic disorder.  Describes mood today as "better". Pleasant. Decreased tearfulness. Mood symptoms - reports decreased depression, anxiety, and irritability. Stating "I'm doing pretty good. Feels like medications are helpful. Notes A1C has gone down. Stable interest and motivation. Taking medications as prescribed.  Energy levels improved. Active, does not have a regular exercise routine.   Enjoys some usual interests and activities. Married. Lives with husband of 23 years - 2 dogs and 1 cat. Spending time with family. Appetite adequate. Weight gain - 220 pounds. Sleeps well most nights. Averages 8 hours. No longer using Clonazepam at bedtime. Focus and concentration improved. Completing tasks. Managing aspects of household. Working part-time at [employer] - 12 hours a week. Denies SI or HI.  Denies AH or VH.  Previously seen by Dr. Tory Emerald  Previous medication trials: Abilify, Gabapentin, Shary Decamp, Effexor    PHQ2-9    [place] Office Visit from 02/10/2021 in [facility] [place] Visit from 11/12/2020 in [place] at [place] Visit from 11/14/2016 in [place] at [place] from 10/08/2016 in [place] at [place] from 10/04/2016 in [facility] at [facility] Total Score 0 1 0 0 0  PHQ-9 Total Score -- 9 0 -- --        Review of Systems:  Review of Systems  Musculoskeletal:  Negative for gait problem.  Neurological:  Negative for tremors.  Psychiatric/Behavioral:         Please refer to HPI   Medications: I have reviewed the patient's current medications.  Current Outpatient  Medications  Medication Sig Dispense Refill   BD PEN NEEDLE NANO 2ND GEN 32G X 4 MM MISC USE AS DIRECTED 100 each 3   citalopram (CELEXA) 40 MG tablet Take 1 tablet (40 mg total) by mouth daily. 90 tablet 1   clonazePAM (KLONOPIN) 0.5 MG tablet Take 1 tablet (0.5 mg total) by mouth 2 (two) times daily. 60 tablet 2   glucose blood (ONETOUCH VERIO) test strip Use as instructed- may check blood sugar up to twice a day. 100 each 12   glucose blood test strip Use to check glucose 1-2x daily 100 each 12   Insulin Glargine (BASAGLAR KWIKPEN) 100 UNIT/ML START WITH 10 UNITS DAILY, INCREASE AS DIRECTED BY MD. ESTIMATE FINAL DOSE WILL BE 25 UNITS DAILY 15 mL 5   lisinopril-hydrochlorothiazide (ZESTORETIC) 10-12.5 MG tablet TAKE 1/2 TABLET ONCE A DAY. MAY INCREASE TO WHOLE TABLET IF DIRECTED BY MD 90 tablet 1   Loratadine 10 MG CAPS Take by mouth.     Multiple Vitamins-Minerals (MULTIVITAMIN WITH MINERALS) tablet Take 1 tablet by mouth daily.     pantoprazole (PROTONIX) 40 MG tablet Take 1 tablet (40 mg total) by mouth daily. 90 tablet 3   pioglitazone (ACTOS) 30 MG tablet TAKE 1 TABLET BY MOUTH EVERY DAY 90 tablet 1   REXULTI 0.5 MG TABS TAKE 1 TABLET BY MOUTH EVERY DAY 90 tablet 2   rosuvastatin (CRESTOR) 40 MG tablet TAKE 1 TABLET BY MOUTH EVERY DAY 90 tablet 1   TRULICITY 1.5 GL/8.7FI SOPN INJECT 1.5MG ONCE A WEEK FOR DIABETES 6 mL 2  No current facility-administered medications for this visit.    Medication Side Effects: None  Allergies:  Allergies  Allergen Reactions   Buspar [Buspirone] Other (See Comments)    Lip swelling and rash numbness    Augmentin [Amoxicillin-Pot Clavulanate] Nausea And Vomiting   Biaxin [Clarithromycin]    Ceftin [Cefuroxime] Swelling    Eye swelling, shortness of breath    Ciprofloxacin     Developed itching with IV form in the hospital, but has since taken PO without a problem   Dapagliflozin Other (See Comments)    Burning upon urination. Increased blood  sugar.   Doxycycline Itching   Levaquin [Levofloxacin] Hives   Metformin And Related Nausea And Vomiting   Phenazopyridine Hcl Other (See Comments)    Does not remember reaction   Pyridium [Phenazopyridine Hcl]     Does not remember reaction   Sulfa Antibiotics     As a younger person had itching, but has taken more recently and did ok   Latex Rash   Lorabid [Loracarbef] Rash    Past Medical History:  Diagnosis Date   Anxiety    Cancer ([place])    skin- Right shin squamous cell   Depression    Diabetes mellitus    Diabetes mellitus, type II ([place])    GERD (gastroesophageal reflux disease)    Hematoma    on buttock   Hyperlipidemia    Hypertension    Migraine    Seasonal allergies     Past Medical History, Surgical history, Social history, and Family history were reviewed and updated as appropriate.   Please see review of systems for further details on the patient's review from today.   Objective:   Physical Exam:  There were no vitals taken for this visit.  Physical Exam Constitutional:      General: She is not in acute distress. Musculoskeletal:        General: No deformity.  Neurological:     Mental Status: She is alert and oriented to person, place, and time.     Coordination: Coordination normal.  Psychiatric:        Attention and Perception: Attention and perception normal. She does not perceive auditory or visual hallucinations.        Mood and Affect: Mood normal. Mood is not anxious or depressed. Affect is not labile, blunt, angry or inappropriate.        Speech: Speech normal.        Behavior: Behavior normal.        Thought Content: Thought content normal. Thought content is not paranoid or delusional. Thought content does not include homicidal or suicidal ideation. Thought content does not include homicidal or suicidal plan.        Cognition and Memory: Cognition and memory normal.        Judgment: Judgment normal.     Comments: Insight intact    Lab  Review:     Component Value Date/Time   NA 137 02/10/2021 1000   K 4.4 02/10/2021 1000   CL 101 02/10/2021 1000   CO2 27 02/10/2021 1000   GLUCOSE 135 (H) 02/10/2021 1000   BUN 23 02/10/2021 1000   CREATININE 0.89 02/10/2021 1000   CREATININE 0.81 07/06/2020 1128   CALCIUM 9.3 02/10/2021 1000   PROT 6.8 02/10/2021 1000   ALBUMIN 4.1 02/10/2021 1000   AST 36 02/10/2021 1000   ALT 35 02/10/2021 1000   ALKPHOS 121 (H) 02/10/2021 1000   BILITOT 0.4 02/10/2021 1000  GFRNONAA >60 04/02/2020 1243   GFRNONAA >89 04/08/2016 1426   GFRAA >60 04/02/2020 1243   GFRAA >89 04/08/2016 1426       Component Value Date/Time   WBC 6.5 11/12/2020 0945   RBC 5.33 (H) 11/12/2020 0945   HGB 14.9 11/12/2020 0945   HCT 44.1 11/12/2020 0945   PLT 315.0 11/12/2020 0945   MCV 82.8 11/12/2020 0945   MCV 78.8 (A) 03/29/2015 0938   MCH 28.5 04/02/2020 1243   MCHC 33.8 11/12/2020 0945   RDW 14.8 11/12/2020 0945   LYMPHSABS 2.9 04/02/2020 1243   MONOABS 0.5 04/02/2020 1243   EOSABS 0.1 04/02/2020 1243   BASOSABS 0.0 04/02/2020 1243    No results found for: POCLITH, LITHIUM   No results found for: PHENYTOIN, PHENOBARB, VALPROATE, CBMZ   .res Assessment: Plan:    Plan:  PDMP reviewed  1. Celexa 73m daily 2. Clonazepam 0.574mBID 3. Rexulti 0.65m465maily  Read and reviewed note with patient for accuracy.   RTC 3 months  Patient advised to contact office with any questions, adverse effects, or acute worsening in signs and symptoms.  Discussed potential benefits, risk, and side effects of benzodiazepines to include potential risk of tolerance and dependence, as well as possible drowsiness.  Advised patient not to drive if experiencing drowsiness and to take lowest possible effective dose to minimize risk of dependence and tolerance.  Discussed potential metabolic side effects associated with atypical antipsychotics, as well as potential risk for movement side effects. Advised pt to contact  office if movement side effects occur.    Diagnoses and all orders for this visit:  Recurrent major depressive disorder, in partial remission ([place]CC[place]landPanic disorder  Obsessional thoughts  Generalized anxiety disorder    Please see After Visit Summary for patient specific instructions.  Future Appointments  Date Time Provider DepWanamingo/05/2021  8:40 AM Copland, JesGay FillerD [facility]    No orders of the defined types were placed in this encounter.   -------------------------------

## 2021-04-20 ENCOUNTER — Other Ambulatory Visit: Payer: Self-pay | Admitting: Family Medicine

## 2021-04-20 DIAGNOSIS — R809 Proteinuria, unspecified: Secondary | ICD-10-CM

## 2021-05-04 NOTE — Progress Notes (Addendum)
[facility] [facility], [place], [place] [address] [phone]  Date:  05/10/2021   Name:  Natalie Richard   DOB:  May 06, 1966   MRN:  [id number]  PCP:  Darreld Mclean, MD    Chief Complaint: Medical Management of Chronic Issues (3 m f/u )   History of Present Illness:  Natalie Richard is a 55 y.o. very pleasant female patient who presents with the following:  Pt seen today for a periodic follow-up visit - History of uncontrolled diabetes, hypertension, [place], obesity, hyperlipidemia, insomnia/anxiety and depression   Last seen by myself in May At that time Natalie Richard had made a lot of progress with her depression and alcohol abuse   Seen by neurology- DR Rexene Alberts- in May also  She is following up regularly with Deloria Lair at [facility] - she prescribes her medication although I am prescribing her clonazepam  She notes that overall she is doing well, but she felt a bit down the last 2 weeks Not as bad she was, no suicidal ideation She had reduced her klonopin to once a day -she notes that Dr. Dwaine Gale has advised her to go back to twice a day She has regained some weight unfortunately -however, some of her previous weight loss was due to depression She is not checking her glucose at home She is taking 30 units of lantus  Also actos and trulicity  She is eating more but is NOT drinking- congratulated her   She enjoys going to the pool and is swimming laps for exercise  Wt Readings from Last 3 Encounters:  05/10/21 234 lb 9.6 oz (106.4 kg)  02/16/21 220 lb (99.8 kg)  02/10/21 222 lb (100.7 kg)     Lab Results  Component Value Date   HGBA1C 8.2 (H) 02/10/2021    Shingrix- we discussed, she wishes to do another time Pneumonia-we will give Prevnar 55 today Pap - she is s/p hyst for benign disease, I advised her Pap is likely no longer indicated Eye exam- this was done approx October of last year   Covid booster Foot exam is due   Patient Active Problem List   Diagnosis Date Noted   History of squamous cell carcinoma in situ (SCCIS) of skin 12/11/2019   NASH (nonalcoholic steatohepatitis) 07/26/2015   BMI 39.0-39.9,adult 03/19/2012   Hematoma 03/19/2012   HTN (hypertension), benign 01/09/2012   Osteopenia 01/09/2012   Migraines 01/09/2012   Insomnia 01/09/2012   Anxiety and depression 01/09/2012   Hyperlipidemia 01/09/2012   Diabetes mellitus 01/09/2012   Allergic rhinitis 01/09/2012    Past Medical History:  Diagnosis Date   Anxiety    Cancer ([place])    skin- Right shin squamous cell   Depression    Diabetes mellitus    Diabetes mellitus, type II ([place])    GERD (gastroesophageal reflux disease)    Hematoma    on buttock   Hyperlipidemia    Hypertension    Migraine    Seasonal allergies     Past Surgical History:  Procedure Laterality Date   ABDOMINAL HYSTERECTOMY  2004   PCOS   EYE SURGERY     INNER EAR SURGERY Left 08/2016    Social History   Tobacco Use   Smoking status: Never   Smokeless tobacco: Never  Vaping Use   Vaping Use: Never used  Substance Use Topics   Alcohol use: Not Currently    Alcohol/week:  8.0 - 10.0 standard drinks    Types: 6 Cans of beer, 2 Shots of liquor per week   Drug use: No    Family History  Problem Relation Age of Onset   Cancer Mother 66       colon   Diabetes Mother    Rheum arthritis Mother    Dementia Mother    Depression Mother    Cancer Father 26       bone and prostate   Cancer Maternal Grandmother        breast   Diabetes Maternal Grandfather    Depression Cousin     Allergies  Allergen Reactions   Buspar [Buspirone] Other (See Comments)    Lip swelling and rash numbness    Augmentin [Amoxicillin-Pot Clavulanate] Nausea And Vomiting   Biaxin [Clarithromycin]    Ceftin [Cefuroxime] Swelling    Eye swelling, shortness of breath    Ciprofloxacin     Developed itching with IV form in the  hospital, but has since taken PO without a problem   Dapagliflozin Other (See Comments)    Burning upon urination. Increased blood sugar.   Doxycycline Itching   Levaquin [Levofloxacin] Hives   Metformin And Related Nausea And Vomiting   Phenazopyridine Hcl Other (See Comments)    Does not remember reaction   Pyridium [Phenazopyridine Hcl]     Does not remember reaction   Sulfa Antibiotics     As a younger person had itching, but has taken more recently and did ok   Latex Rash   Lorabid [Loracarbef] Rash    Medication list has been reviewed and updated.  Current Outpatient Medications on File Prior to Visit  Medication Sig Dispense Refill   BD PEN NEEDLE NANO 2ND GEN 32G X 4 MM MISC USE AS DIRECTED 100 each 3   Brexpiprazole (REXULTI) 0.5 MG TABS Take 1 tablet (0.5 mg total) by mouth daily. 30 tablet 5   citalopram (CELEXA) 40 MG tablet Take 1 tablet (40 mg total) by mouth daily. 90 tablet 3   clonazePAM (KLONOPIN) 0.5 MG tablet Take 1 tablet (0.5 mg total) by mouth 2 (two) times daily. 60 tablet 2   glucose blood (ONETOUCH VERIO) test strip Use as instructed- may check blood sugar up to twice a day. 100 each 12   glucose blood test strip Use to check glucose 1-2x daily 100 each 12   Insulin Glargine (BASAGLAR KWIKPEN) 100 UNIT/ML START WITH 10 UNITS DAILY, INCREASE AS DIRECTED BY MD. ESTIMATE FINAL DOSE WILL BE 25 UNITS DAILY 15 mL 5   lisinopril-hydrochlorothiazide (ZESTORETIC) 10-12.5 MG tablet TAKE 1/2 TABLET ONCE A DAY MAY INCREASE TO 1 TABLET DAILY IF DIRECTED BY MD 90 tablet 1   Loratadine 10 MG CAPS Take by mouth.     Multiple Vitamins-Minerals (MULTIVITAMIN WITH MINERALS) tablet Take 1 tablet by mouth daily.     pantoprazole (PROTONIX) 40 MG tablet Take 1 tablet (40 mg total) by mouth daily. 90 tablet 3   pioglitazone (ACTOS) 30 MG tablet TAKE 1 TABLET BY MOUTH EVERY DAY 90 tablet 1   rosuvastatin (CRESTOR) 40 MG tablet TAKE 1 TABLET BY MOUTH EVERY DAY 90 tablet 1    TRULICITY 1.5 ZR/0.0TM SOPN INJECT 1.5MG ONCE A WEEK FOR DIABETES 6 mL 2   No current facility-administered medications on file prior to visit.    Review of Systems:  As per HPI- otherwise negative.   Physical Examination: Vitals:   05/10/21 0851  BP: 114/78  Pulse:  83  Temp: 98.6 F (37 C)  SpO2: 97%   Vitals:   05/10/21 0851  Weight: 234 lb 9.6 oz (106.4 kg)  Height: 5' 6"  (1.676 m)   Body mass index is 37.87 kg/m. Ideal Body Weight: Weight in (lb) to have BMI = 25: 154.6  GEN: no acute distress. Obese, looks well  HEENT: Atraumatic, Normocephalic.  Ears and Nose: No external deformity. CV: RRR, No M/G/R. No JVD. No thrill. No extra heart sounds. PULM: CTA B, no wheezes, crackles, rhonchi. No retractions. No resp. distress. No accessory muscle use.Marland Kitchen EXTR: No c/c/e PSYCH: Normally interactive. Conversant.  Foot exam is normal today  Assessment and Plan: HTN (hypertension), benign  Mixed hyperlipidemia  Type 2 diabetes mellitus without complication, without long-term current use of insulin ([place]) - Plan: Hemoglobin A1c  Depression with anxiety  Type 2 diabetes mellitus with hyperglycemia, without long-term current use of insulin (HCC) - Plan: Insulin Pen Needle (BD PEN NEEDLE NANO 2ND GEN) 32G X 4 MM MISC  Immunization due - Plan: Pneumococcal conjugate vaccine 20-valent (Prevnar 20)  Following up today.  Natalie Richard's depression and anxiety is overall improved from previous.  She has stopped drinking alcohol which is great.  She will let me know if not doing okay as far as her mood She has gained back some weight.  We will check on her A1c today.  Perhaps might change her Trulicity, to a higher dose if needed- will also help with weight   Blood pressures under okay control  Check lipids today This visit occurred during the SARS-CoV-2 public health emergency.  Safety protocols were in place, including screening questions prior to the visit, additional usage of staff  PPE, and extensive cleaning of exam room while observing appropriate contact time as indicated for disinfecting solutions.   Signed Lamar Blinks, MD  Received her A1c as below, we will have to call patient as she does not have my chart Discussed with her, she would like to go ahead and increase Trulicity Send in prescription for 3 mg dose  Results for orders placed or performed in visit on 05/10/21  Hemoglobin A1c  Result Value Ref Range   Hgb A1c MFr Bld 9.2 (H) 4.6 - 6.5 %

## 2021-05-04 NOTE — Patient Instructions (Addendum)
Good to see you again today- I will be in touch with your A1c You got your pneumonia booster today- you should be done with pneumonia vaccines now!   We can plan to do your shingles vaccine at your convenience Keep up with recommended covid 19 boosters  Great job stopping drinking- keep up the great work

## 2021-05-10 ENCOUNTER — Other Ambulatory Visit: Payer: Self-pay

## 2021-05-10 ENCOUNTER — Ambulatory Visit (INDEPENDENT_AMBULATORY_CARE_PROVIDER_SITE_OTHER): Payer: BC Managed Care – PPO | Admitting: Family Medicine

## 2021-05-10 VITALS — BP 114/78 | HR 83 | Temp 98.6°F | Ht 66.0 in | Wt 234.6 lb

## 2021-05-10 DIAGNOSIS — I10 Essential (primary) hypertension: Secondary | ICD-10-CM

## 2021-05-10 DIAGNOSIS — E119 Type 2 diabetes mellitus without complications: Secondary | ICD-10-CM

## 2021-05-10 DIAGNOSIS — F418 Other specified anxiety disorders: Secondary | ICD-10-CM | POA: Diagnosis not present

## 2021-05-10 DIAGNOSIS — E782 Mixed hyperlipidemia: Secondary | ICD-10-CM | POA: Diagnosis not present

## 2021-05-10 DIAGNOSIS — Z23 Encounter for immunization: Secondary | ICD-10-CM

## 2021-05-10 DIAGNOSIS — E1165 Type 2 diabetes mellitus with hyperglycemia: Secondary | ICD-10-CM

## 2021-05-10 LAB — HEMOGLOBIN A1C: Hgb A1c MFr Bld: 9.2 % — ABNORMAL HIGH (ref 4.6–6.5)

## 2021-05-10 MED ORDER — BD PEN NEEDLE NANO 2ND GEN 32G X 4 MM MISC
3 refills | Status: DC
Start: 2021-05-10 — End: 2022-08-09

## 2021-05-10 MED ORDER — TRULICITY 3 MG/0.5ML ~~LOC~~ SOAJ: 3.0000 mg | SUBCUTANEOUS | 6 refills | Status: DC

## 2021-05-10 NOTE — Addendum Note (Signed)
Addended by: Lamar Blinks C on: 05/10/2021 06:18 PM   Modules accepted: Orders

## 2021-05-24 ENCOUNTER — Telehealth: Payer: Self-pay | Admitting: Family Medicine

## 2021-05-24 DIAGNOSIS — J0110 Acute frontal sinusitis, unspecified: Secondary | ICD-10-CM

## 2021-05-24 MED ORDER — AMOXICILLIN 500 MG PO CAPS: 1000.0000 mg | ORAL_CAPSULE | Freq: Two times a day (BID) | ORAL | 0 refills | Status: DC

## 2021-05-24 NOTE — Telephone Encounter (Signed)
Called her back- for about 5 days she has noted malaise, now having green mucus from her nose, sinus pain and pressure She is using advil which does help  No fever noted but she has been a bit chilled  Some cough  She has not tested for covid- I encouraged her to do this as covid is possible Will treat with amox which she has taken recently and tolerated without any sign of allergic reaction- for sinusitis

## 2021-05-24 NOTE — Telephone Encounter (Signed)
Pt states she is needing a rx for her sinus infection. States that Dr. Lorelei Pont is well aware of her getting sinus infections all the time and normally will call her in the rx. Please advis

## 2021-06-15 ENCOUNTER — Encounter: Payer: Self-pay | Admitting: Adult Health

## 2021-06-15 ENCOUNTER — Ambulatory Visit (INDEPENDENT_AMBULATORY_CARE_PROVIDER_SITE_OTHER): Payer: BC Managed Care – PPO | Admitting: Adult Health

## 2021-06-15 ENCOUNTER — Other Ambulatory Visit: Payer: Self-pay

## 2021-06-15 DIAGNOSIS — F3341 Major depressive disorder, recurrent, in partial remission: Secondary | ICD-10-CM

## 2021-06-15 DIAGNOSIS — F410 Panic disorder [episodic paroxysmal anxiety] without agoraphobia: Secondary | ICD-10-CM

## 2021-06-15 DIAGNOSIS — F428 Other obsessive-compulsive disorder: Secondary | ICD-10-CM | POA: Diagnosis not present

## 2021-06-15 DIAGNOSIS — F331 Major depressive disorder, recurrent, moderate: Secondary | ICD-10-CM

## 2021-06-15 MED ORDER — BREXPIPRAZOLE 1 MG PO TABS: 1.0000 mg | ORAL_TABLET | Freq: Every day | ORAL | 2 refills | Status: DC

## 2021-06-15 MED ORDER — CLONAZEPAM 0.5 MG PO TABS: 0.5000 mg | ORAL_TABLET | Freq: Two times a day (BID) | ORAL | 2 refills | Status: DC

## 2021-06-15 NOTE — Progress Notes (Signed)
Natalie Richard [id number] 1966-01-16 55 y.o.  Subjective:   Patient ID:  Natalie Richard is a 55 y.o. (DOB 05-Dec-1965) female.  Chief Complaint: No chief complaint on file.   HPI Natalie Richard presents to the office today for follow-up of MDD, GAD, obsesional thoughts, panic disorder.  Describes mood today as "better". Pleasant. Decreased tearfulness. Mood symptoms - reports depression, anxiety, and irritability. Stating "it's just like I don't care". Feels "tired and worn out". Symptoms have gotten "worse" over the past week or so. Notes her A1C has increased by 1 point and is concerned it may be part of her mood decline. Decreased interest and motivation. Taking medications as prescribed.  Energy levels lower - "having to push herself". Active, does not have a regular exercise routine.  Enjoys some usual interests and activities. Married. Lives with husband of 23 years - 2 dogs and 1 cat. Spending time with family. Appetite decreased. Weight gain - 234 pounds. Sleeps better some nights than others. Averages 6 to 8 hours of broken sleep. Restarted the Clonazepam at bedtime and feels like it has helped with sleep.  Focus and concentration difficulties. Going to the kitchen and forgetting what she went in there for. Completing tasks. Managing very few aspects of household. Working part-time at [employer] - 15 hours a week. Denies SI or HI.  Denies AH or VH.  Previously seen by Dr. Tory Emerald  Previous medication trials: Abilify, Gabapentin, Shary Decamp, Effexor     PHQ2-9    [place] Office Visit from 05/10/2021 in [facility] [place] Visit from 02/10/2021 in [facility] [place] Visit from 11/12/2020 in [place] [facility] [place] Visit from 11/14/2016 in [place] [facility] [place] from 10/08/2016 in [facility] at [facility] Total Score 2 0 1 0 0   PHQ-9 Total Score 4 -- 9 0 --        Review of Systems:  Review of Systems  Musculoskeletal:  Negative for gait problem.  Neurological:  Negative for tremors.  Psychiatric/Behavioral:         Please refer to HPI   Medications: I have reviewed the patient's current medications.  Current Outpatient Medications  Medication Sig Dispense Refill   brexpiprazole (REXULTI) 1 MG TABS tablet Take 1 tablet (1 mg total) by mouth daily. 30 tablet 2   amoxicillin (AMOXIL) 500 MG capsule Take 2 capsules (1,000 mg total) by mouth 2 (two) times daily. 40 capsule 0   citalopram (CELEXA) 40 MG tablet Take 1 tablet (40 mg total) by mouth daily. 90 tablet 3   clonazePAM (KLONOPIN) 0.5 MG tablet Take 1 tablet (0.5 mg total) by mouth 2 (two) times daily. 60 tablet 2   Dulaglutide (TRULICITY) 3 IO/9.6EX SOPN Inject 3 mg as directed once a week. 2 mL 6   glucose blood (ONETOUCH VERIO) test strip Use as instructed- may check blood sugar up to twice a day. 100 each 12   glucose blood test strip Use to check glucose 1-2x daily 100 each 12   Insulin Glargine (BASAGLAR KWIKPEN) 100 UNIT/ML START WITH 10 UNITS DAILY, INCREASE AS DIRECTED BY MD. ESTIMATE FINAL DOSE WILL BE 25 UNITS DAILY 15 mL 5   Insulin Pen Needle (BD PEN NEEDLE NANO 2ND GEN) 32G X 4 MM MISC USE AS DIRECTED 100 each 3   lisinopril-hydrochlorothiazide (ZESTORETIC) 10-12.5 MG tablet TAKE 1/2 TABLET ONCE A  DAY MAY INCREASE TO 1 TABLET DAILY IF DIRECTED BY MD 90 tablet 1   Loratadine 10 MG CAPS Take by mouth.     Multiple Vitamins-Minerals (MULTIVITAMIN WITH MINERALS) tablet Take 1 tablet by mouth daily.     pantoprazole (PROTONIX) 40 MG tablet Take 1 tablet (40 mg total) by mouth daily. 90 tablet 3   pioglitazone (ACTOS) 30 MG tablet TAKE 1 TABLET BY MOUTH EVERY DAY 90 tablet 1   rosuvastatin (CRESTOR) 40 MG tablet TAKE 1 TABLET BY MOUTH EVERY DAY 90 tablet 1   No current facility-administered medications for this visit.    Medication Side  Effects: None  Allergies:  Allergies  Allergen Reactions   Buspar [Buspirone] Other (See Comments)    Lip swelling and rash numbness    Augmentin [Amoxicillin-Pot Clavulanate] Nausea And Vomiting   Biaxin [Clarithromycin]    Ceftin [Cefuroxime] Swelling    Eye swelling, shortness of breath    Ciprofloxacin     Developed itching with IV form in the hospital, but has since taken PO without a problem   Dapagliflozin Other (See Comments)    Burning upon urination. Increased blood sugar.   Doxycycline Itching   Levaquin [Levofloxacin] Hives   Metformin And Related Nausea And Vomiting   Phenazopyridine Hcl Other (See Comments)    Does not remember reaction   Pyridium [Phenazopyridine Hcl]     Does not remember reaction   Sulfa Antibiotics     As a younger person had itching, but has taken more recently and did ok   Latex Rash   Lorabid [Loracarbef] Rash    Past Medical History:  Diagnosis Date   Anxiety    Cancer ([place])    skin- Right shin squamous cell   Depression    Diabetes mellitus    Diabetes mellitus, type II ([place])    GERD (gastroesophageal reflux disease)    Hematoma    on buttock   Hyperlipidemia    Hypertension    Migraine    Seasonal allergies     Past Medical History, Surgical history, Social history, and Family history were reviewed and updated as appropriate.   Please see review of systems for further details on the patient's review from today.   Objective:   Physical Exam:  There were no vitals taken for this visit.  Physical Exam Constitutional:      General: She is not in acute distress. Musculoskeletal:        General: No deformity.  Neurological:     Mental Status: She is alert and oriented to person, place, and time.     Coordination: Coordination normal.  Psychiatric:        Attention and Perception: Attention and perception normal. She does not perceive auditory or visual hallucinations.        Mood and Affect: Mood normal. Mood is not  anxious or depressed. Affect is not labile, blunt, angry or inappropriate.        Speech: Speech normal.        Behavior: Behavior normal.        Thought Content: Thought content normal. Thought content is not paranoid or delusional. Thought content does not include homicidal or suicidal ideation. Thought content does not include homicidal or suicidal plan.        Cognition and Memory: Cognition and memory normal.        Judgment: Judgment normal.     Comments: Insight intact    Lab Review:     Component  Value Date/Time   NA 137 02/10/2021 1000   K 4.4 02/10/2021 1000   CL 101 02/10/2021 1000   CO2 27 02/10/2021 1000   GLUCOSE 135 (H) 02/10/2021 1000   BUN 23 02/10/2021 1000   CREATININE 0.89 02/10/2021 1000   CREATININE 0.81 07/06/2020 1128   CALCIUM 9.3 02/10/2021 1000   PROT 6.8 02/10/2021 1000   ALBUMIN 4.1 02/10/2021 1000   AST 36 02/10/2021 1000   ALT 35 02/10/2021 1000   ALKPHOS 121 (H) 02/10/2021 1000   BILITOT 0.4 02/10/2021 1000   GFRNONAA >60 04/02/2020 1243   GFRNONAA >89 04/08/2016 1426   GFRAA >60 04/02/2020 1243   GFRAA >89 04/08/2016 1426       Component Value Date/Time   WBC 6.5 11/12/2020 0945   RBC 5.33 (H) 11/12/2020 0945   HGB 14.9 11/12/2020 0945   HCT 44.1 11/12/2020 0945   PLT 315.0 11/12/2020 0945   MCV 82.8 11/12/2020 0945   MCV 78.8 (A) 03/29/2015 0938   MCH 28.5 04/02/2020 1243   MCHC 33.8 11/12/2020 0945   RDW 14.8 11/12/2020 0945   LYMPHSABS 2.9 04/02/2020 1243   MONOABS 0.5 04/02/2020 1243   EOSABS 0.1 04/02/2020 1243   BASOSABS 0.0 04/02/2020 1243    No results found for: POCLITH, LITHIUM   No results found for: PHENYTOIN, PHENOBARB, VALPROATE, CBMZ   .res Assessment: Plan:    an:  PDMP reviewed  1. Celexa 27m daily 2. Clonazepam 0.563mBID 3. Increase Rexulti 0.67m87mo 1mg41mily  Consider Wellbutrin  RTC 4 weeks.  Patient advised to contact office with any questions, adverse effects, or acute worsening in signs and  symptoms.  Discussed potential benefits, risk, and side effects of benzodiazepines to include potential risk of tolerance and dependence, as well as possible drowsiness.  Advised patient not to drive if experiencing drowsiness and to take lowest possible effective dose to minimize risk of dependence and tolerance.  Discussed potential metabolic side effects associated with atypical antipsychotics, as well as potential risk for movement side effects. Advised pt to contact office if movement side effects occur.    Diagnoses and all orders for this visit:  Obsessional thoughts  Major depressive disorder, recurrent episode, moderate (HCC) -     brexpiprazole (REXULTI) 1 MG TABS tablet; Take 1 tablet (1 mg total) by mouth daily.  Recurrent major depressive disorder, in partial remission (HCC) -     clonazePAM (KLONOPIN) 0.5 MG tablet; Take 1 tablet (0.5 mg total) by mouth 2 (two) times daily.  Panic disorder -     clonazePAM (KLONOPIN) 0.5 MG tablet; Take 1 tablet (0.5 mg total) by mouth 2 (two) times daily.    Please see After Visit Summary for patient specific instructions.  Future Appointments  Date Time Provider DepaOsgood/03/2021  1:00 PM Stephane Niemann, RegiBerdie Ogren CP-CP None    No orders of the defined types were placed in this encounter.   -------------------------------

## 2021-06-22 ENCOUNTER — Telehealth: Payer: Self-pay

## 2021-06-22 NOTE — Progress Notes (Addendum)
[place] at [facility] [address] [place], [place] [address] [phone]  Date:  06/23/2021   Name:  Natalie Richard   DOB:  19-May-1966   MRN:  [id number]  PCP:  Darreld Mclean, MD    Chief Complaint: Fatigue (Loss of appetite, Pt says she is not feeling herself. She thinks she might be deficient in something. Sxs since Mid August.)   History of Present Illness:  Natalie Richard is a 55 y.o. very pleasant female patient who presents with the following:  Patient seen today with concern of not feeling well-  History of uncontrolled diabetes, hypertension, [place], obesity, hyperlipidemia, insomnia/anxiety and depression  Most recent visit with myself about 6 weeks ago.  Natalie Richard was doing much better with her depression alcohol abuse but still had some symptoms She was swimming for exercise unfortunately since then her pool is closed for the season A1c was high at that time, we increased her dose of Trulicity to 3 mg  She continues to work part-time at [employer] She has psychiatric care and is following up regularly Here today with sx for about one month-patient notes she is feeling more tired, she wonders if she may have vitamin deficiency or other medical problem.  She has discussed the symptoms with her mental health care provider; it seems that her depression is under good control so there may be some other cause They had increased her rixalti to 1 mg- however this made her feel sedated so she decreased to 0.5 mg again After she eats she feels somnolent or like she might need to vomit She has noted more headaches- however does not seem to be sinus related  Her blood sugar has been under reasonable control Her glucose this am was 148 fasting.  Current dose of insulin is Basaglar 34 units  She does snore- she had a sleep study scheduled back in June but had to delay due to a problem with her back   She also has noted  geographic tongue recently -she has consulted with DDS about this   Wt Readings from Last 3 Encounters:  06/23/21 239 lb 6.4 oz (108.6 kg)  05/10/21 234 lb 9.6 oz (106.4 kg)  02/16/21 220 lb (99.8 kg)    Shingles vaccine COVID new booster- Mammogram- done last year per [facility]   Lab Results  Component Value Date   HGBA1C 9.2 (H) 05/10/2021    Patient Active Problem List   Diagnosis Date Noted   History of squamous cell carcinoma in situ (SCCIS) of skin 12/11/2019   NASH (nonalcoholic steatohepatitis) 07/26/2015   BMI 39.0-39.9,adult 03/19/2012   Hematoma 03/19/2012   HTN (hypertension), benign 01/09/2012   Osteopenia 01/09/2012   Migraines 01/09/2012   Insomnia 01/09/2012   Anxiety and depression 01/09/2012   Hyperlipidemia 01/09/2012   Diabetes mellitus 01/09/2012   Allergic rhinitis 01/09/2012    Past Medical History:  Diagnosis Date   Anxiety    Cancer ([place])    skin- Right shin squamous cell   Depression    Diabetes mellitus    Diabetes mellitus, type II ([place])    GERD (gastroesophageal reflux disease)    Hematoma    on buttock   Hyperlipidemia    Hypertension    Migraine    Seasonal allergies     Past Surgical History:  Procedure Laterality Date   ABDOMINAL HYSTERECTOMY  2004   [facility]   EYE SURGERY  INNER EAR SURGERY Left 08/2016    Social History   Tobacco Use   Smoking status: Never   Smokeless tobacco: Never  Vaping Use   Vaping Use: Never used  Substance Use Topics   Alcohol use: Not Currently    Alcohol/week: 8.0 - 10.0 standard drinks    Types: 6 Cans of beer, 2 Shots of liquor per week   Drug use: No    Family History  Problem Relation Age of Onset   Cancer Mother 104       colon   Diabetes Mother    Rheum arthritis Mother    Dementia Mother    Depression Mother    Cancer Father 15       bone and prostate   Cancer Maternal Grandmother        breast   Diabetes Maternal Grandfather    Depression Cousin      Allergies  Allergen Reactions   Buspar [Buspirone] Other (See Comments)    Lip swelling and rash numbness    Augmentin [Amoxicillin-Pot Clavulanate] Nausea And Vomiting   Biaxin [Clarithromycin]    Ceftin [Cefuroxime] Swelling    Eye swelling, shortness of breath    Ciprofloxacin     Developed itching with IV form in the hospital, but has since taken PO without a problem   Dapagliflozin Other (See Comments)    Burning upon urination. Increased blood sugar.   Doxycycline Itching   Levaquin [Levofloxacin] Hives   Metformin And Related Nausea And Vomiting   Phenazopyridine Hcl Other (See Comments)    Does not remember reaction   Pyridium [Phenazopyridine Hcl]     Does not remember reaction   Sulfa Antibiotics     As a younger person had itching, but has taken more recently and did ok   Latex Rash   Lorabid [Loracarbef] Rash    Medication list has been reviewed and updated.  Current Outpatient Medications on File Prior to Visit  Medication Sig Dispense Refill   brexpiprazole (REXULTI) 1 MG TABS tablet Take 1 tablet (1 mg total) by mouth daily. 30 tablet 2   citalopram (CELEXA) 40 MG tablet Take 1 tablet (40 mg total) by mouth daily. 90 tablet 3   clonazePAM (KLONOPIN) 0.5 MG tablet Take 1 tablet (0.5 mg total) by mouth 2 (two) times daily. 60 tablet 2   Dulaglutide (TRULICITY) 3 ZH/2.9JM SOPN Inject 3 mg as directed once a week. 2 mL 6   glucose blood (ONETOUCH VERIO) test strip Use as instructed- may check blood sugar up to twice a day. 100 each 12   glucose blood test strip Use to check glucose 1-2x daily 100 each 12   Insulin Glargine (BASAGLAR KWIKPEN) 100 UNIT/ML START WITH 10 UNITS DAILY, INCREASE AS DIRECTED BY MD. ESTIMATE FINAL DOSE WILL BE 25 UNITS DAILY 15 mL 5   Insulin Pen Needle (BD PEN NEEDLE NANO 2ND GEN) 32G X 4 MM MISC USE AS DIRECTED 100 each 3   lisinopril-hydrochlorothiazide (ZESTORETIC) 10-12.5 MG tablet TAKE 1/2 TABLET ONCE A DAY MAY INCREASE TO 1  TABLET DAILY IF DIRECTED BY MD 90 tablet 1   Loratadine 10 MG CAPS Take by mouth.     Multiple Vitamins-Minerals (MULTIVITAMIN WITH MINERALS) tablet Take 1 tablet by mouth daily.     pantoprazole (PROTONIX) 40 MG tablet Take 1 tablet (40 mg total) by mouth daily. 90 tablet 3   pioglitazone (ACTOS) 30 MG tablet TAKE 1 TABLET BY MOUTH EVERY DAY 90 tablet 1  rosuvastatin (CRESTOR) 40 MG tablet TAKE 1 TABLET BY MOUTH EVERY DAY 90 tablet 1   No current facility-administered medications on file prior to visit.    Review of Systems:  As per HPI- otherwise negative.   Physical Examination: Vitals:   06/23/21 1544  BP: 110/78  Pulse: 73  Resp: 18  Temp: 98 F (36.7 C)  SpO2: 98%   Vitals:   06/23/21 1544  Weight: 239 lb 6.4 oz (108.6 kg)  Height: 5' 6"  (1.676 m)   Body mass index is 38.64 kg/m. Ideal Body Weight: Weight in (lb) to have BMI = 25: 154.6  GEN: no acute distress.  Obese, looks well  HEENT: Atraumatic, Normocephalic.  Bilateral TM wnl, oropharynx normal.  PEERL,EOMI.  Geographic tongue is present  Ears and Nose: No external deformity. CV: RRR, No M/G/R. No JVD. No thrill. No extra heart sounds. PULM: CTA B, no wheezes, crackles, rhonchi. No retractions. No resp. distress. No accessory muscle use. ABD: S, NT, ND, +BS. No rebound. No HSM. EXTR: No c/c/e PSYCH: Normally interactive. Conversant.    Assessment and Plan: Fatigue, unspecified type - Plan: VITAMIN D 25 Hydroxy (Vit-D Deficiency, Fractures), TSH, B34, Basic metabolic panel, CBC, Folate  Somnolence  Snoring  HTN (hypertension), benign - Plan: Basic metabolic panel, CBC  Depression with anxiety  Type 2 diabetes mellitus with hyperglycemia, without long-term current use of insulin ([place]) - Plan: Basic metabolic panel  Labs pending as above to look for cause of fatigue- Will plan further follow- up pending labs.  Her glucose is under better control Depression sx improved   This visit occurred  during the SARS-CoV-2 public health emergency.  Safety protocols were in place, including screening questions prior to the visit, additional usage of staff PPE, and extensive cleaning of exam room while observing appropriate contact time as indicated for disinfecting solutions.   Signed Lamar Blinks, MD  Addendum 9/22, received her labs as below.  Letter to patient Results for orders placed or performed in visit on 06/23/21  VITAMIN D 25 Hydroxy (Vit-D Deficiency, Fractures)  Result Value Ref Range   VITD 29.00 (L) 30.00 - 100.00 ng/mL  TSH  Result Value Ref Range   TSH 1.51 0.35 - 5.50 uIU/mL  B12  Result Value Ref Range   Vitamin B-12 398 211 - 911 pg/mL  Basic metabolic panel  Result Value Ref Range   Sodium 140 135 - 145 mEq/L   Potassium 4.0 3.5 - 5.1 mEq/L   Chloride 102 96 - 112 mEq/L   CO2 27 19 - 32 mEq/L   Glucose, Bld 158 (H) 70 - 99 mg/dL   BUN 9 6 - 23 mg/dL   Creatinine, Ser 0.87 0.40 - 1.20 mg/dL   GFR 74.19 >60.00 mL/min   Calcium 9.3 8.4 - 10.5 mg/dL  CBC  Result Value Ref Range   WBC 6.8 4.0 - 10.5 K/uL   RBC 4.93 3.87 - 5.11 Mil/uL   Platelets 319.0 150.0 - 400.0 K/uL   Hemoglobin 13.9 12.0 - 15.0 g/dL   HCT 42.0 36.0 - 46.0 %   MCV 85.2 78.0 - 100.0 fl   MCHC 33.1 30.0 - 36.0 g/dL   RDW 14.7 11.5 - 15.5 %  Folate  Result Value Ref Range   Folate 19.4 >5.9 ng/mL

## 2021-06-22 NOTE — Patient Instructions (Addendum)
Good to see you again today!  Please consider getting the updated COVID booster this fall if not done already I will be in touch with your labs asap

## 2021-06-22 NOTE — Telephone Encounter (Signed)
Nurse Assessment Nurse: Nyoka Cowden, RN, [place] Date/Time (Eastern Time): 06/22/2021 11:52:37 AM Confirm and document reason for call. If symptomatic, describe symptoms. ---Caller states she feels like her potassium might be low. She is fatigued, and has been going on since august 1st thought it was her anxiety but got an increase on her anxiety meds but symptoms did not change. Does the patient have any new or worsening symptoms? ---Yes Will a triage be completed? ---Yes Related visit to physician within the last 2 weeks? ---No Does the PT have any chronic conditions? (i.e. diabetes, asthma, this includes High risk factors for pregnancy, etc.) ---Yes List chronic conditions. ---diabetes, anxiety, depression, hypertension. Is this a behavioral health or substance abuse call? ---No Guidelines Guideline Title Affirmed Question Affirmed Notes Nurse Date/Time (Eastern Time) Dizziness - Lightheadedness [1] MODERATE dizziness (e.g., interferes with normal activities) AND [2] has NOT been evaluated by physician for this (Exception: dizziness caused by Bryant, Terry, Jordan Hawks 06/22/2021 11:55:42 AM PLEASE NOTE: All timestamps contained within this report are represented as [place] Standard Time. CONFIDENTIALTY NOTICE: This fax transmission is intended only for the addressee. It contains information that is legally privileged, confidential or otherwise protected from use or disclosure. If you are not the intended recipient, you are strictly prohibited from reviewing, disclosing, copying using or disseminating any of this information or taking any action in reliance on or regarding this information. If you have received this fax in error, please notify us immediately by telephone so that we can arrange for its return to [place]. Phone: [phone], Toll-Free: [phone], Fax: [phone] Page: 2 of 2 Call Id: [id number] Guidelines Guideline Title Affirmed Question Affirmed Notes Nurse  Date/Time Eilene Ghazi Time) exposure, sudden standing, or poor fluid intake) Disp. Time Eilene Ghazi Time) Disposition Final User 06/22/2021 11:59:06 AM See PCP within 24 Hours Yes Nyoka Cowden, RN, Beatrix Shipper Disagree/Comply Comply Caller Understands Yes PreDisposition Call Doctor Care Advice Given Per Guideline SEE PCP WITHIN 24 HOURS: * IF OFFICE WILL BE OPEN: You need to be examined within the next 24 hours. Call your doctor (or NP/PA) when the office opens and make an appointment. DRINK FLUIDS: * Drink several glasses of fruit juice, other clear fluids or water. LIE DOWN AND REST: * Lie down with feet elevated for 1 hour. * This will improve circulation and increase blood flow to the brain. CALL BACK IF: * Passes out (faints) * You become worse CARE ADVICE given per Dizziness (Adult) guideline. Comments User: Shanon Rosser, RN Date/Time Eilene Ghazi Time): 06/22/2021 12:04:55 PM patient has an appointment scheduled for tom but requesting to get an earlier appointment for today. Spoke with the backline and made aware there are no available appointments today and advised caller to keep her appointment for tom and if anything changes or gets worse to call back. Caller voiced understanding Referrals REFERRED TO PCP OFFICE

## 2021-06-23 ENCOUNTER — Ambulatory Visit (INDEPENDENT_AMBULATORY_CARE_PROVIDER_SITE_OTHER): Payer: BC Managed Care – PPO | Admitting: Family Medicine

## 2021-06-23 ENCOUNTER — Other Ambulatory Visit: Payer: Self-pay

## 2021-06-23 VITALS — BP 110/78 | HR 73 | Temp 98.0°F | Resp 18 | Ht 66.0 in | Wt 239.4 lb

## 2021-06-23 DIAGNOSIS — R0683 Snoring: Secondary | ICD-10-CM

## 2021-06-23 DIAGNOSIS — R5383 Other fatigue: Secondary | ICD-10-CM | POA: Diagnosis not present

## 2021-06-23 DIAGNOSIS — E1165 Type 2 diabetes mellitus with hyperglycemia: Secondary | ICD-10-CM

## 2021-06-23 DIAGNOSIS — F418 Other specified anxiety disorders: Secondary | ICD-10-CM

## 2021-06-23 DIAGNOSIS — R400 Somnolence: Secondary | ICD-10-CM | POA: Diagnosis not present

## 2021-06-23 DIAGNOSIS — I10 Essential (primary) hypertension: Secondary | ICD-10-CM | POA: Diagnosis not present

## 2021-06-24 LAB — CBC
HCT: 42 % (ref 36.0–46.0)
Hemoglobin: 13.9 g/dL (ref 12.0–15.0)
MCHC: 33.1 g/dL (ref 30.0–36.0)
MCV: 85.2 fl (ref 78.0–100.0)
Platelets: 319 10*3/uL (ref 150.0–400.0)
RBC: 4.93 Mil/uL (ref 3.87–5.11)
RDW: 14.7 % (ref 11.5–15.5)
WBC: 6.8 10*3/uL (ref 4.0–10.5)

## 2021-06-24 LAB — BASIC METABOLIC PANEL
BUN: 9 mg/dL (ref 6–23)
CO2: 27 mEq/L (ref 19–32)
Calcium: 9.3 mg/dL (ref 8.4–10.5)
Chloride: 102 mEq/L (ref 96–112)
Creatinine, Ser: 0.87 mg/dL (ref 0.40–1.20)
GFR: 74.19 mL/min (ref >60.00)
Glucose, Bld: 158 mg/dL — ABNORMAL HIGH (ref 70–99)
Potassium: 4 mEq/L (ref 3.5–5.1)
Sodium: 140 mEq/L (ref 135–145)

## 2021-06-24 LAB — VITAMIN D 25 HYDROXY (VIT D DEFICIENCY, FRACTURES): VITD: 29 ng/mL — ABNORMAL LOW (ref 30.00–100.00)

## 2021-06-24 LAB — TSH: TSH: 1.51 u[IU]/mL (ref 0.35–5.50)

## 2021-06-24 LAB — FOLATE: Folate: 19.4 ng/mL (ref >5.9)

## 2021-06-24 LAB — VITAMIN B12: Vitamin B-12: 398 pg/mL (ref 211–911)

## 2021-07-06 ENCOUNTER — Telehealth: Payer: Self-pay | Admitting: *Deleted

## 2021-07-06 NOTE — Telephone Encounter (Signed)
Patient called about lab results from 06/23/21.  Results given.

## 2021-07-07 ENCOUNTER — Telehealth: Payer: Self-pay | Admitting: Family Medicine

## 2021-07-07 DIAGNOSIS — E1165 Type 2 diabetes mellitus with hyperglycemia: Secondary | ICD-10-CM

## 2021-07-07 NOTE — Telephone Encounter (Signed)
Please advise 

## 2021-07-07 NOTE — Telephone Encounter (Signed)
Patient states that ever since she has started taken her trulicity she has had diarrhea, abdominal pain, nausea, irritability, and etc. She would like to know what to do. Please advice.

## 2021-07-08 ENCOUNTER — Encounter: Payer: Self-pay | Admitting: Adult Health

## 2021-07-08 ENCOUNTER — Ambulatory Visit (INDEPENDENT_AMBULATORY_CARE_PROVIDER_SITE_OTHER): Payer: BC Managed Care – PPO | Admitting: Adult Health

## 2021-07-08 ENCOUNTER — Other Ambulatory Visit: Payer: Self-pay

## 2021-07-08 DIAGNOSIS — F331 Major depressive disorder, recurrent, moderate: Secondary | ICD-10-CM

## 2021-07-08 DIAGNOSIS — F428 Other obsessive-compulsive disorder: Secondary | ICD-10-CM

## 2021-07-08 DIAGNOSIS — F410 Panic disorder [episodic paroxysmal anxiety] without agoraphobia: Secondary | ICD-10-CM | POA: Diagnosis not present

## 2021-07-08 DIAGNOSIS — F3341 Major depressive disorder, recurrent, in partial remission: Secondary | ICD-10-CM | POA: Diagnosis not present

## 2021-07-08 DIAGNOSIS — F411 Generalized anxiety disorder: Secondary | ICD-10-CM

## 2021-07-08 MED ORDER — BUPROPION HCL ER (XL) 150 MG PO TB24: ORAL_TABLET | ORAL | 2 refills | Status: DC

## 2021-07-08 NOTE — Telephone Encounter (Signed)
I called pt yesterday and LMOM, called again today- still no answer

## 2021-07-08 NOTE — Progress Notes (Signed)
Natalie Richard [id number] 10-23-1965 55 y.o.  Subjective:   Patient ID:  Natalie Richard is a 55 y.o. (DOB Jan 23, 1966) female.  Chief Complaint: No chief complaint on file.   HPI Natalie Richard presents to the office today for follow-up of  MDD, GAD, obsesional thoughts, panic disorder.  Describes mood today as "about the same". Pleasant. Increased tearfulness. Having "crying spells". Mood symptoms - reports depression, anxiety, and irritability. Reports Stating "I'm not feeling any better". Feels like the increase in Rexulti to 35m made her feel over medicated, so she is now taking the 0.580mdose. Diabetes medication recently reduced. Vitamin D low and is started on supplements.Decreased interest and motivation. Taking medications as prescribed.  Energy levels lower  - "I want to do, but I get tired out easily". Active, does not have a regular exercise routine.  Enjoys some usual interests and activities. Married. Lives with husband of 23 years - 2 dogs and 1 cat. Spending time with family. Appetite decreased. Weight loss - less than 234 pounds. Sleeps well most nights. Averages 8 hours. Focus and concentration difficulties. Completing tasks. Managing aspects of household. Working part-time at [employer] 15 hours a week. Denies SI or HI.  Denies AH or VH.  Previously seen by Dr. ExTory EmeraldPrevious medication trials: Abilify, Gabapentin, Natalie DecampEffexor    PHQ2-9    [facility] 0 0  PHQ-9 Total Score 4 -- 9 0 --        Review of Systems:  Review of Systems  Musculoskeletal:   Negative for gait problem.  Neurological:  Negative for tremors.  Psychiatric/Behavioral:         Please refer to HPI   Medications: I have reviewed the patient's current medications.  Current Outpatient Medications  Medication Sig Dispense Refill   buPROPion (WELLBUTRIN XL) 150 MG 24 hr tablet Take one tablet every morning for 7 days, then take two tablets every morning. 60 tablet 2   brexpiprazole (REXULTI) 1 MG TABS tablet Take 1 tablet (1 mg total) by mouth daily. 30 tablet 2   citalopram (CELEXA) 40 MG tablet Take 1 tablet (40 mg total) by mouth daily. 90 tablet 3   clonazePAM (KLONOPIN) 0.5 MG tablet Take 1 tablet (0.5 mg total) by mouth 2 (two) times daily. 60 tablet 2   Dulaglutide (TRULICITY) 3 MGDG/3.8VFOPN Inject 3 mg as directed once a week. 2 mL 6   glucose blood (ONETOUCH VERIO) test strip Use as instructed- may check blood sugar up to twice a day. 100 each 12   glucose blood test strip Use to check glucose 1-2x daily 100 each 12   Insulin Glargine (BASAGLAR KWIKPEN) 100 UNIT/ML START WITH 10 UNITS DAILY, INCREASE AS DIRECTED BY MD. ESTIMATE FINAL DOSE WILL BE 25 UNITS DAILY 15 mL 5   Insulin Pen Needle (BD PEN NEEDLE NANO 2ND GEN) 32G X 4 MM MISC USE AS DIRECTED 100 each 3   lisinopril-hydrochlorothiazide (ZESTORETIC) 10-12.5 MG tablet TAKE 1/2 TABLET ONCE A DAY MAY INCREASE TO 1 TABLET DAILY IF DIRECTED BY MD 90 tablet 1   Loratadine 10  MG CAPS Take by mouth.     Multiple Vitamins-Minerals (MULTIVITAMIN WITH MINERALS) tablet Take 1 tablet by mouth daily.     pantoprazole (PROTONIX) 40 MG tablet Take 1 tablet (40 mg total) by mouth daily. 90 tablet 3   pioglitazone (ACTOS) 30 MG tablet TAKE 1 TABLET BY MOUTH EVERY DAY 90 tablet 1   rosuvastatin (CRESTOR) 40 MG tablet TAKE 1 TABLET BY MOUTH EVERY DAY 90 tablet 1   No current facility-administered medications for this visit.    Medication Side Effects: None  Allergies:  Allergies  Allergen Reactions   Buspar [Buspirone]  Other (See Comments)    Lip swelling and rash numbness    Augmentin [Amoxicillin-Pot Clavulanate] Nausea And Vomiting   Biaxin [Clarithromycin]    Ceftin [Cefuroxime] Swelling    Eye swelling, shortness of breath    Ciprofloxacin     Developed itching with IV form in the hospital, but has since taken PO without a problem   Dapagliflozin Other (See Comments)    Burning upon urination. Increased blood sugar.   Doxycycline Itching   Levaquin [Levofloxacin] Hives   Metformin And Related Nausea And Vomiting   Phenazopyridine Hcl Other (See Comments)    Does not remember reaction   Pyridium [Phenazopyridine Hcl]     Does not remember reaction   Sulfa Antibiotics     As a younger person had itching, but has taken more recently and did ok   Latex Rash   Lorabid [Loracarbef] Rash    Past Medical History:  Diagnosis Date   Anxiety    Cancer ([place])    skin- Right shin squamous cell   Depression    Diabetes mellitus    Diabetes mellitus, type II ([place])    GERD (gastroesophageal reflux disease)    Hematoma    on buttock   Hyperlipidemia    Hypertension    Migraine    Seasonal allergies     Past Medical History, Surgical history, Social history, and Family history were reviewed and updated as appropriate.   Please see review of systems for further details on the patient's review from today.   Objective:   Physical Exam:  There were no vitals taken for this visit.  Physical Exam Constitutional:      General: She is not in acute distress. Musculoskeletal:        General: No deformity.  Neurological:     Mental Status: She is alert and oriented to person, place, and time.     Coordination: Coordination normal.  Psychiatric:        Attention and Perception: Attention and perception normal. She does not perceive auditory or visual hallucinations.        Mood and Affect: Mood normal. Mood is not anxious or depressed. Affect is not labile, blunt, angry or inappropriate.         Speech: Speech normal.        Behavior: Behavior normal.        Thought Content: Thought content normal. Thought content is not paranoid or delusional. Thought content does not include homicidal or suicidal ideation. Thought content does not include homicidal or suicidal plan.        Cognition and Memory: Cognition and memory normal.        Judgment: Judgment normal.     Comments: Insight intact    Lab Review:     Component Value Date/Time   NA 140 06/23/2021 1615   K 4.0 06/23/2021 1615   CL 102  06/23/2021 1615   CO2 27 06/23/2021 1615   GLUCOSE 158 (H) 06/23/2021 1615   BUN 9 06/23/2021 1615   CREATININE 0.87 06/23/2021 1615   CREATININE 0.81 07/06/2020 1128   CALCIUM 9.3 06/23/2021 1615   PROT 6.8 02/10/2021 1000   ALBUMIN 4.1 02/10/2021 1000   AST 36 02/10/2021 1000   ALT 35 02/10/2021 1000   ALKPHOS 121 (H) 02/10/2021 1000   BILITOT 0.4 02/10/2021 1000   GFRNONAA >60 04/02/2020 1243   GFRNONAA >89 04/08/2016 1426   GFRAA >60 04/02/2020 1243   GFRAA >89 04/08/2016 1426       Component Value Date/Time   WBC 6.8 06/23/2021 1615   RBC 4.93 06/23/2021 1615   HGB 13.9 06/23/2021 1615   HCT 42.0 06/23/2021 1615   PLT 319.0 06/23/2021 1615   MCV 85.2 06/23/2021 1615   MCV 78.8 (A) 03/29/2015 0938   MCH 28.5 04/02/2020 1243   MCHC 33.1 06/23/2021 1615   RDW 14.7 06/23/2021 1615   LYMPHSABS 2.9 04/02/2020 1243   MONOABS 0.5 04/02/2020 1243   EOSABS 0.1 04/02/2020 1243   BASOSABS 0.0 04/02/2020 1243    No results found for: POCLITH, LITHIUM   No results found for: PHENYTOIN, PHENOBARB, VALPROATE, CBMZ   .res Assessment: Plan:    Plan:  PDMP reviewed  1. Celexa 72m daily 2. Clonazepam 0.567mBID 3. D/C Rexulti 0.60m58maily 4. Add Wellbutrin XL 150m63mily x 7 days, then increase to 300mg89mly. Denies seizure history.  RTC 4 weeks.  Patient advised to contact office with any questions, adverse effects, or acute worsening in signs and symptoms.  Discussed  potential benefits, risk, and side effects of benzodiazepines to include potential risk of tolerance and dependence, as well as possible drowsiness.  Advised patient not to drive if experiencing drowsiness and to take lowest possible effective dose to minimize risk of dependence and tolerance.  Discussed potential metabolic side effects associated with atypical antipsychotics, as well as potential risk for movement side effects. Advised pt to contact office if movement side effects occur.   Diagnoses and all orders for this visit:  Major depressive disorder, recurrent episode, moderate (HCC)  Obsessional thoughts  Recurrent major depressive disorder, in partial remission (HCC) [place]   buPROPion (WELLBUTRIN XL) 150 MG 24 hr tablet; Take one tablet every morning for 7 days, then take two tablets every morning.  Panic disorder  Generalized anxiety disorder    Please see After Visit Summary for patient specific instructions.  No future appointments.  No orders of the defined types were placed in this encounter.   -------------------------------

## 2021-07-09 MED ORDER — TRULICITY 1.5 MG/0.5ML ~~LOC~~ SOAJ: 1.5000 mg | SUBCUTANEOUS | 3 refills | Status: DC

## 2021-07-09 NOTE — Telephone Encounter (Signed)
Called and reached her She was on trulicity 1.5, sx started when we increased to 3 mg Advised to go back down to 1.5 mg, she will adjust her insulin if needed for glucose control.  Will see me in [place] for recheck

## 2021-07-09 NOTE — Telephone Encounter (Signed)
Pt called office today trying to reach you.  Advised patient you were out of office today.

## 2021-07-19 ENCOUNTER — Telehealth: Payer: Self-pay | Admitting: Adult Health

## 2021-07-19 NOTE — Telephone Encounter (Signed)
Natalie Richard called and said that she has been on Wellbutrin for two weeks but it is making her jittery. She states she is not taking the Wellbutrin now and went back on the Rexulti on Saturday 07/17/21. She would like someone to call her at [phone].

## 2021-07-19 NOTE — Telephone Encounter (Signed)
Agreed. Lets see if the second Clonazepam helps. If it is the medication, it should be out of her system over the next few days.

## 2021-07-19 NOTE — Telephone Encounter (Signed)
Pt stated on the first week of Wellbutrin she became very jittery.She breaks out in a nervous sweat,and feels like she is going to jump out of her skin.She stopped taking Wellbutrin and switched back to rexulti but is still jittery/nervous and sweating.She does not wish to take wellbutrin anymore.

## 2021-07-19 NOTE — Telephone Encounter (Signed)
Pt informed

## 2021-07-19 NOTE — Telephone Encounter (Signed)
LVM to rtc 

## 2021-07-19 NOTE — Telephone Encounter (Signed)
Please let pt know gina agrees to the switch back to rexulti and that side effects should resolve over the next week.

## 2021-07-19 NOTE — Telephone Encounter (Signed)
Pt called back and stated she needs something to help with the nervousness.She is on the verge of an anxiety attack and she is not sure if this is side effects from Wellbutrin or not.She takes 1 klonopin every morning and that did not help today,I recommended her taking another one since the rx is for 2 daily as needed.I told her I would double check with you to see if this is the best solution or if you had another suggestion.

## 2021-07-19 NOTE — Telephone Encounter (Signed)
Noted. Side effects should be transient.

## 2021-07-31 ENCOUNTER — Other Ambulatory Visit: Payer: Self-pay | Admitting: Adult Health

## 2021-07-31 DIAGNOSIS — F3341 Major depressive disorder, recurrent, in partial remission: Secondary | ICD-10-CM

## 2021-08-03 NOTE — Telephone Encounter (Signed)
Appt is tomorrow

## 2021-08-04 ENCOUNTER — Other Ambulatory Visit: Payer: Self-pay

## 2021-08-04 ENCOUNTER — Ambulatory Visit: Payer: BC Managed Care – PPO | Admitting: Adult Health

## 2021-08-04 ENCOUNTER — Encounter: Payer: Self-pay | Admitting: Adult Health

## 2021-08-04 DIAGNOSIS — F331 Major depressive disorder, recurrent, moderate: Secondary | ICD-10-CM | POA: Diagnosis not present

## 2021-08-04 DIAGNOSIS — F410 Panic disorder [episodic paroxysmal anxiety] without agoraphobia: Secondary | ICD-10-CM

## 2021-08-04 DIAGNOSIS — F411 Generalized anxiety disorder: Secondary | ICD-10-CM | POA: Diagnosis not present

## 2021-08-04 DIAGNOSIS — F428 Other obsessive-compulsive disorder: Secondary | ICD-10-CM

## 2021-08-04 DIAGNOSIS — F3341 Major depressive disorder, recurrent, in partial remission: Secondary | ICD-10-CM | POA: Diagnosis not present

## 2021-08-04 NOTE — Progress Notes (Signed)
Natalie Richard [id number] April 28, 1966 55 y.o.  Subjective:   Patient ID:  Natalie Richard is a 55 y.o. (DOB 07/15/1966) female.  Chief Complaint: No chief complaint on file.   HPI Natalie Richard presents to the office today for follow-up of MDD, GAD, obsesional thoughts, panic disorder.  Describes mood today as "ok". Pleasant. Decreased tearfulness. Mood symptoms - reports anxiety and irritability. Denies depression. Stating "things are better, but not where they need to be". Had to stop the Wellbutrin because it made her too jittery. Has restarted the Rexulti and feels like it's helpful. Concerned about finances. Varying interest and motivation. Taking medications as prescribed.  Energy levels better. Active, does not have a regular exercise routine.  Enjoys some usual interests and activities. Married. Lives with husband of 23 years - 2 dogs and 1 cat. Spending time with family. Appetite decreased. Weight stable - 230's. Sleeps well most nights. Averages 8 hours. Focus and concentration difficulties. Completing tasks. Managing aspects of household. Working part-time at [employer] - 12 to 15 hours a week. Denies SI or HI.  Denies AH or VH.  Previously seen by Dr. Tory Emerald  Previous medication trials: Abilify, Gabapentin, Shary Decamp, Effexor  PHQ2-9    [place] Office Visit from 05/10/2021 in [facility] at [place] Visit from 02/10/2021 in [facility] at [place] Visit from 11/12/2020 in [place] at [place] Visit from 11/14/2016 in [place] at [place] from 10/08/2016 in Primary Care at [facility] Total Score 2 0 1 0 0  PHQ-9 Total Score 4 -- 9 0 --        Review of Systems:  Review of Systems  Musculoskeletal:  Negative for gait problem.  Neurological:  Negative for tremors.  Psychiatric/Behavioral:         Please refer to HPI    Medications: I have reviewed the patient's current medications.  Current Outpatient Medications  Medication Sig Dispense Refill   brexpiprazole (REXULTI) 1 MG TABS tablet Take 1 tablet (1 mg total) by mouth daily. 30 tablet 2   citalopram (CELEXA) 40 MG tablet Take 1 tablet (40 mg total) by mouth daily. 90 tablet 3   clonazePAM (KLONOPIN) 0.5 MG tablet Take 1 tablet (0.5 mg total) by mouth 2 (two) times daily. 60 tablet 2   Dulaglutide (TRULICITY) 1.5 KD/9.8PJ SOPN Inject 1.5 mg into the skin once a week. 6 mL 3   Dulaglutide (TRULICITY) 3 AS/5.0NL SOPN Inject 3 mg as directed once a week. 2 mL 6   glucose blood (ONETOUCH VERIO) test strip Use as instructed- may check blood sugar up to twice a day. 100 each 12   glucose blood test strip Use to check glucose 1-2x daily 100 each 12   Insulin Glargine (BASAGLAR KWIKPEN) 100 UNIT/ML START WITH 10 UNITS DAILY, INCREASE AS DIRECTED BY MD. ESTIMATE FINAL DOSE WILL BE 25 UNITS DAILY 15 mL 5   Insulin Pen Needle (BD PEN NEEDLE NANO 2ND GEN) 32G X 4 MM MISC USE AS DIRECTED 100 each 3   lisinopril-hydrochlorothiazide (ZESTORETIC) 10-12.5 MG tablet TAKE 1/2 TABLET ONCE A DAY MAY INCREASE TO 1 TABLET DAILY IF DIRECTED BY MD 90 tablet 1   Loratadine 10 MG CAPS Take by mouth.     Multiple Vitamins-Minerals (MULTIVITAMIN WITH MINERALS) tablet Take 1 tablet by mouth daily.     pantoprazole (PROTONIX) 40 MG tablet Take 1 tablet (  40 mg total) by mouth daily. 90 tablet 3   pioglitazone (ACTOS) 30 MG tablet TAKE 1 TABLET BY MOUTH EVERY DAY 90 tablet 1   rosuvastatin (CRESTOR) 40 MG tablet TAKE 1 TABLET BY MOUTH EVERY DAY 90 tablet 1   No current facility-administered medications for this visit.    Medication Side Effects: None  Allergies:  Allergies  Allergen Reactions   Buspar [Buspirone] Other (See Comments)    Lip swelling and rash numbness    Augmentin [Amoxicillin-Pot Clavulanate] Nausea And Vomiting   Biaxin [Clarithromycin]    Ceftin  [Cefuroxime] Swelling    Eye swelling, shortness of breath    Ciprofloxacin     Developed itching with IV form in the hospital, but has since taken PO without a problem   Dapagliflozin Other (See Comments)    Burning upon urination. Increased blood sugar.   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   Please see review of systems for further details on the patient's review from today.   Objective:   Physical Exam:  There were no vitals taken for this visit.  Physical Exam Constitutional:      General: She is not in acute distress. Musculoskeletal:        General: No deformity.  Neurological:     Mental Status: She is alert and oriented to person, place, and time.     Coordination: Coordination normal.  Psychiatric:        Attention and Perception: Attention and perception normal. She does not perceive auditory or visual hallucinations.        Mood and Affect: Mood normal. Mood is not anxious or depressed. Affect is not labile, blunt, angry or inappropriate.        Speech: Speech normal.        Behavior: Behavior normal.        Thought Content: Thought content normal. Thought content is not paranoid or delusional.  Thought content does not include homicidal or suicidal ideation. Thought content does not include homicidal or suicidal plan.        Cognition and Memory: Cognition and memory normal.        Judgment: Judgment normal.     Comments: Insight intact    Lab Review:     Component Value Date/Time   NA 140 06/23/2021 1615   K 4.0 06/23/2021 1615   CL 102 06/23/2021 1615   CO2 27 06/23/2021 1615   GLUCOSE 158 (H) 06/23/2021 1615   BUN 9 06/23/2021 1615   CREATININE 0.87 06/23/2021 1615   CREATININE 0.81 07/06/2020 1128  CALCIUM 9.3 06/23/2021 1615   PROT 6.8 02/10/2021 1000   ALBUMIN 4.1 02/10/2021 1000   AST 36 02/10/2021 1000   ALT 35 02/10/2021 1000   ALKPHOS 121 (H) 02/10/2021 1000   BILITOT 0.4 02/10/2021 1000   GFRNONAA >60 04/02/2020 1243   GFRNONAA >89 04/08/2016 1426   GFRAA >60 04/02/2020 1243   GFRAA >89 04/08/2016 1426       Component Value Date/Time   WBC 6.8 06/23/2021 1615   RBC 4.93 06/23/2021 1615   HGB 13.9 06/23/2021 1615   HCT 42.0 06/23/2021 1615   PLT 319.0 06/23/2021 1615   MCV 85.2 06/23/2021 1615   MCV 78.8 (A) 03/29/2015 0938   MCH 28.5 04/02/2020 1243   MCHC 33.1 06/23/2021 1615   RDW 14.7 06/23/2021 1615   LYMPHSABS 2.9 04/02/2020 1243   MONOABS 0.5 04/02/2020 1243   EOSABS 0.1 04/02/2020 1243   BASOSABS 0.0 04/02/2020 1243    No results found for: POCLITH, LITHIUM   No results found for: PHENYTOIN, PHENOBARB, VALPROATE, CBMZ   .res Assessment: Plan:     Plan:  PDMP reviewed  1. Celexa 90m daily 2. Clonazepam 0.524mBID 3. Rexulti 0.4m66maily - may increase to 1mg51mRTC 4 weeks.  Patient advised to contact office with any questions, adverse effects, or acute worsening in signs and symptoms.  Discussed potential benefits, risk, and side effects of benzodiazepines to include potential risk of tolerance and dependence, as well as possible drowsiness.  Advised patient not to drive if experiencing drowsiness and to take lowest  possible effective dose to minimize risk of dependence and tolerance.  Discussed potential metabolic side effects associated with atypical antipsychotics, as well as potential risk for movement side effects. Advised pt to contact office if movement side effects occur.    Diagnoses and all orders for this visit:  Major depressive disorder, recurrent episode, moderate (HCC)  Obsessional thoughts  Recurrent major depressive disorder, in partial remission (HCC)[place]  Panic disorder    Please see After Visit Summary for patient specific instructions.  No future appointments.  No orders of the defined types were placed in this encounter.   -------------------------------

## 2021-08-05 ENCOUNTER — Ambulatory Visit: Payer: BC Managed Care – PPO | Admitting: Adult Health

## 2021-08-08 ENCOUNTER — Other Ambulatory Visit: Payer: Self-pay | Admitting: Family Medicine

## 2021-08-08 DIAGNOSIS — E785 Hyperlipidemia, unspecified: Secondary | ICD-10-CM

## 2021-08-29 NOTE — Progress Notes (Addendum)
[facility] [address], [place], [place] [address] [phone]  Date:  09/02/2021   Name:  Natalie Richard   DOB:  [birth year]   MRN:  [id number]  PCP:  Darreld Mclean, MD    Chief Complaint: 3 month follow up (Concerns/ questions: interested in starting Zoster series. /Flu shot today:declines)   History of Present Illness:  MURIEL HANNOLD is a 55 y.o. very pleasant female patient who presents with the following:  Patient seen today for follow-up visit Most recently seen by myself in September- History of uncontrolled diabetes, hypertension, [place], obesity, hyperlipidemia, insomnia/anxiety and depression  She follows up with her mental health care provider on a regular basis- Deloria Lair NP  At her most recent visit she was concerned about feeling fatigued, she was found to have mild vitamin D deficiency- she is feeling better with supplementation   They had to put their dog down this week, and she had a root canal also-it has been a tough week, but she feels that her mood is under okay control on current medications.  She is not suffering from severe depression symptoms at this time  Lab Results  Component Value Date   HGBA1C 9.2 (H) 05/10/2021   Her A1c was elevated in August, we did increase dose of Trulicity-however, this caused side effects we had to decrease back down to 1.5 mg She is also taking Actos 30, Basaglar We will check her diabetes progress today  Shingrix- start today  Mammogram- done per [facility]- 4/0/22 COVID-19 booster Eye exam- she was seen about 2 weeks ago for glaucoma check, she sees DR Madelaine Etienne Readings from Last 3 Encounters:  09/02/21 239 lb 12.8 oz (108.8 kg)  06/23/21 239 lb 6.4 oz (108.6 kg)  05/10/21 234 lb 9.6 oz (106.4 kg)     Patient Active Problem List   Diagnosis Date Noted   History of squamous cell carcinoma in situ (SCCIS) of skin 12/11/2019   NASH  (nonalcoholic steatohepatitis) 07/26/2015   BMI 39.0-39.9,adult 03/19/2012   Hematoma 03/19/2012   HTN (hypertension), benign 01/09/2012   Osteopenia 01/09/2012   Migraines 01/09/2012   Insomnia 01/09/2012   Anxiety and depression 01/09/2012   Hyperlipidemia 01/09/2012   Diabetes mellitus 01/09/2012   Allergic rhinitis 01/09/2012    Past Medical History:  Diagnosis Date   Anxiety    Cancer ([place])    skin- Right shin squamous cell   Depression    Diabetes mellitus    Diabetes mellitus, type II ([place])    GERD (gastroesophageal reflux disease)    Hematoma    on buttock   Hyperlipidemia    Hypertension    Migraine    Seasonal allergies     Past Surgical History:  Procedure Laterality Date   ABDOMINAL HYSTERECTOMY  2004   [facility]   EYE SURGERY     INNER EAR SURGERY Left 08/2016    Social History   Tobacco Use   Smoking status: Never   Smokeless tobacco: Never  Vaping Use   Vaping Use: Never used  Substance Use Topics   Alcohol use: Not Currently    Alcohol/week: 8.0 - 10.0 standard drinks    Types: 6 Cans of beer, 2 Shots of liquor per week   Drug use: No    Family History  Problem Relation Age of Onset   Cancer Mother 66       colon   Diabetes  Mother    Rheum arthritis Mother    Dementia Mother    Depression Mother    Cancer Father 32       bone and prostate   Cancer Maternal Grandmother        breast   Diabetes Maternal Grandfather    Depression Cousin     Allergies  Allergen Reactions   Buspar [Buspirone] Other (See Comments)    Lip swelling and rash numbness    Augmentin [Amoxicillin-Pot Clavulanate] Nausea And Vomiting   Biaxin [Clarithromycin]    Ceftin [Cefuroxime] Swelling    Eye swelling, shortness of breath    Ciprofloxacin     Developed itching with IV form in the hospital, but has since taken PO without a problem   Dapagliflozin Other (See Comments)    Burning upon urination. Increased blood sugar.   Doxycycline Itching   Levaquin  [Levofloxacin] Hives   Metformin And Related Nausea And Vomiting   Phenazopyridine Hcl Other (See Comments)    Does not remember reaction   Pyridium [Phenazopyridine Hcl]     Does not remember reaction   Sulfa Antibiotics     As a younger person had itching, but has taken more recently and did ok   Latex Rash   Lorabid [Loracarbef] Rash    Medication list has been reviewed and updated.  Current Outpatient Medications on File Prior to Visit  Medication Sig Dispense Refill   brexpiprazole (REXULTI) 1 MG TABS tablet Take 1 tablet (1 mg total) by mouth daily. 30 tablet 2   citalopram (CELEXA) 40 MG tablet Take 1 tablet (40 mg total) by mouth daily. 90 tablet 3   clonazePAM (KLONOPIN) 0.5 MG tablet Take 1 tablet (0.5 mg total) by mouth 2 (two) times daily. 60 tablet 2   Dulaglutide (TRULICITY) 1.5 PR/9.1MB SOPN Inject 1.5 mg into the skin once a week. 6 mL 3   glucose blood (ONETOUCH VERIO) test strip Use as instructed- may check blood sugar up to twice a day. 100 each 12   glucose blood test strip Use to check glucose 1-2x daily 100 each 12   Insulin Glargine (BASAGLAR KWIKPEN) 100 UNIT/ML START WITH 10 UNITS DAILY, INCREASE AS DIRECTED BY MD. ESTIMATE FINAL DOSE WILL BE 25 UNITS DAILY 15 mL 5   Insulin Pen Needle (BD PEN NEEDLE NANO 2ND GEN) 32G X 4 MM MISC USE AS DIRECTED 100 each 3   lisinopril-hydrochlorothiazide (ZESTORETIC) 10-12.5 MG tablet TAKE 1/2 TABLET ONCE A DAY MAY INCREASE TO 1 TABLET DAILY IF DIRECTED BY MD 90 tablet 1   Loratadine 10 MG CAPS Take by mouth.     Multiple Vitamins-Minerals (MULTIVITAMIN WITH MINERALS) tablet Take 1 tablet by mouth daily.     pantoprazole (PROTONIX) 40 MG tablet Take 1 tablet (40 mg total) by mouth daily. 90 tablet 3   pioglitazone (ACTOS) 30 MG tablet TAKE 1 TABLET BY MOUTH EVERY DAY 90 tablet 1   rosuvastatin (CRESTOR) 40 MG tablet TAKE 1 TABLET BY MOUTH EVERY DAY 90 tablet 0   amoxicillin (AMOXIL) 500 MG capsule Take by mouth.     No  current facility-administered medications on file prior to visit.    Review of Systems:  As per HPI- otherwise negative.   Physical Examination: Vitals:   09/02/21 1030  BP: 118/72  Pulse: 83  Resp: 18  Temp: 98.3 F (36.8 C)  SpO2: 97%   Vitals:   09/02/21 1030  Weight: 239 lb 12.8 oz (108.8 kg)  Height: 5'  6" (1.676 m)   Body mass index is 38.7 kg/m. Ideal Body Weight: Weight in (lb) to have BMI = 25: 154.6  GEN: no acute distress.  Obese, looks well HEENT: Atraumatic, Normocephalic.  Ears and Nose: No external deformity. CV: RRR, No M/G/R. No JVD. No thrill. No extra heart sounds. PULM: CTA B, no wheezes, crackles, rhonchi. No retractions. No resp. distress. No accessory muscle use. ABD: S, NT, ND, +BS. No rebound. No HSM. EXTR: No c/c/e PSYCH: Normally interactive. Conversant.    Assessment and Plan: Type 2 diabetes mellitus with hyperglycemia, without long-term current use of insulin (HCC) - Plan: Hemoglobin A1c, CANCELED: Hemoglobin A1c  HTN (hypertension), benign  Mixed hyperlipidemia  NASH (nonalcoholic steatohepatitis)  Immunization due - Plan: Varicella-zoster vaccine IM (Shingrix), CANCELED: Varicella-zoster vaccine IM (Shingrix)  Patient seen today for follow-up.  We will check her A1c today, we were unable to increase her Trulicity dose due to side effects.  Blood pressures under good control, continue lisinopril HCTZ Encouraged continued weight loss and exercise efforts to control her fatty liver Given first dose of Shingrix today, patient declined the flu shot  Patient tolerated her shingles vaccine well and walked down to the lab.  However, waiting for lab she felt lightheaded and laid down on the couch.  She did not faint.  Patient was attended to by myself and other staff members.  She blood sugar approximately 200, blood pressure was suppressed at approximately 100/60 when symptoms began.  Patient rested, drank juice and water.  She felt  better, blood pressures increased to her normal range.  We decided to defer her blood draw today and will do this tomorrow-she will be sure to eat prior to her blood draw  Patient felt well and that she was okay to leave clinic  Signed Lamar Blinks, MD  Addendum 12/2, received her labs as below.  Gave patient a call She is feeling fine, fully recovered from her presyncopal episode yesterday. She has Trulicity 3 mg on hand.  We tried this previously and she did not tolerate it well, however she notes some of this may have been anxiety-her anxiety medication has been adjusted and she is feeling much better overall.  She will try 3 mg again and let me know if she can tolerate it  Lab Results  Component Value Date   HGBA1C 8.6 (H) 09/03/2021

## 2021-09-02 ENCOUNTER — Ambulatory Visit (INDEPENDENT_AMBULATORY_CARE_PROVIDER_SITE_OTHER): Payer: BC Managed Care – PPO | Admitting: Family Medicine

## 2021-09-02 VITALS — BP 118/72 | HR 83 | Temp 98.3°F | Resp 18 | Ht 66.0 in | Wt 239.8 lb

## 2021-09-02 DIAGNOSIS — E1165 Type 2 diabetes mellitus with hyperglycemia: Secondary | ICD-10-CM | POA: Diagnosis not present

## 2021-09-02 DIAGNOSIS — E782 Mixed hyperlipidemia: Secondary | ICD-10-CM | POA: Diagnosis not present

## 2021-09-02 DIAGNOSIS — Z23 Encounter for immunization: Secondary | ICD-10-CM

## 2021-09-02 DIAGNOSIS — K7581 Nonalcoholic steatohepatitis (NASH): Secondary | ICD-10-CM

## 2021-09-02 DIAGNOSIS — I10 Essential (primary) hypertension: Secondary | ICD-10-CM | POA: Diagnosis not present

## 2021-09-02 NOTE — Patient Instructions (Addendum)
First dose of shingrix today- we can do your 2nd dose in 2-6 months I do suggest getting the newest covid vaccine and a flu shot this fall  I will be in touch with your A1c asap!

## 2021-09-03 ENCOUNTER — Other Ambulatory Visit (INDEPENDENT_AMBULATORY_CARE_PROVIDER_SITE_OTHER): Payer: BC Managed Care – PPO

## 2021-09-03 DIAGNOSIS — E1165 Type 2 diabetes mellitus with hyperglycemia: Secondary | ICD-10-CM

## 2021-09-03 LAB — HEMOGLOBIN A1C: Hgb A1c MFr Bld: 8.6 % — ABNORMAL HIGH (ref 4.6–6.5)

## 2021-10-03 ENCOUNTER — Other Ambulatory Visit: Payer: Self-pay | Admitting: Family Medicine

## 2021-10-03 DIAGNOSIS — E119 Type 2 diabetes mellitus without complications: Secondary | ICD-10-CM

## 2021-10-05 ENCOUNTER — Ambulatory Visit: Payer: BC Managed Care – PPO | Admitting: Adult Health

## 2021-10-11 ENCOUNTER — Ambulatory Visit: Payer: BC Managed Care – PPO | Admitting: Adult Health

## 2021-10-25 ENCOUNTER — Encounter: Payer: Self-pay | Admitting: Adult Health

## 2021-10-25 ENCOUNTER — Ambulatory Visit: Payer: BC Managed Care – PPO | Admitting: Adult Health

## 2021-10-25 ENCOUNTER — Other Ambulatory Visit: Payer: Self-pay

## 2021-10-25 DIAGNOSIS — F331 Major depressive disorder, recurrent, moderate: Secondary | ICD-10-CM | POA: Diagnosis not present

## 2021-10-25 DIAGNOSIS — F410 Panic disorder [episodic paroxysmal anxiety] without agoraphobia: Secondary | ICD-10-CM

## 2021-10-25 DIAGNOSIS — F411 Generalized anxiety disorder: Secondary | ICD-10-CM | POA: Diagnosis not present

## 2021-10-25 DIAGNOSIS — F428 Other obsessive-compulsive disorder: Secondary | ICD-10-CM

## 2021-10-25 MED ORDER — BREXPIPRAZOLE 1 MG PO TABS: 1.0000 mg | ORAL_TABLET | Freq: Every day | ORAL | 5 refills | Status: DC

## 2021-10-25 MED ORDER — CLONAZEPAM 0.5 MG PO TABS: 0.5000 mg | ORAL_TABLET | Freq: Two times a day (BID) | ORAL | 2 refills | Status: DC

## 2021-10-25 NOTE — Progress Notes (Signed)
Natalie Richard [id number] 10/29/1965 56 y.o.  Subjective:   Patient ID:  Natalie Richard is a 56 y.o. (DOB Mar 15, 1966) female.  Chief Complaint: No chief complaint on file.   HPI Natalie Richard presents to the office today for follow-up of MDD, GAD, obsesional thoughts, panic disorder.  Describes mood today as "ok". Pleasant. Decreased tearfulness. Mood symptoms - denies anxiety and irritability. Reports some depression with loss of pets. Has increased the Rexulti to 44m daily. Stating "I'm doing alright".  Varying interest and motivation. Taking medications as prescribed.  Energy levels lower. Active, does not have a regular exercise routine.  Enjoys some usual interests and activities. Married. Lives with husband of 23 years - 1 cat and 1 dog. Spending time with family. Appetite decreased. Weight stable - 230's. Sleeps well most nights. Averages 8 hours. Focus and concentration difficulties. Completing tasks. Managing aspects of household. Working part-time at [employer] Denies SI or HI.  Denies AH or VH.  Previously seen by Dr. ETory Emerald Previous medication trials: Abilify, Gabapentin, LShary Decamp Effexor   PHQ2-9    [facility]Office [facility] [place] [facility] from [place]/12[facility] from [facility]14[facility] 0 2 0 1 0  PHQ-9 Total Score 3 4 -- 9 0        Review of Systems:  Review of Systems  Musculoskeletal:  Negative for gait problem.  Neurological:  Negative for tremors.  Psychiatric/Behavioral:         Please refer to HPI   Medications: I have reviewed the patient's current medications.  Current Outpatient Medications   Medication Sig Dispense Refill   amoxicillin (AMOXIL) 500 MG capsule Take by mouth.     brexpiprazole (REXULTI) 1 MG TABS tablet Take 1 tablet (1 mg total) by mouth daily. 30 tablet 5   citalopram (CELEXA) 40 MG tablet Take 1 tablet (40 mg total) by mouth daily. 90 tablet 3   clonazePAM (KLONOPIN) 0.5 MG tablet Take 1 tablet (0.5 mg total) by mouth 2 (two) times daily. 60 tablet 2   Dulaglutide (TRULICITY) 1.5 MFV/4.9SWSOPN Inject 1.5 mg into the skin once a week. 6 mL 3   glucose blood (ONETOUCH VERIO) test strip Use as instructed- may check blood sugar up to twice a day. 100 each 12   glucose blood test strip Use to check glucose 1-2x daily 100 each 12   Insulin Glargine (BASAGLAR KWIKPEN) 100 UNIT/ML START WITH 10 UNITS DAILY, INCREASE AS DIRECTED BY MD. ESTIMATE FINAL DOSE WILL BE 25 UNITS DAILY 15 mL 5   Insulin Pen Needle (BD PEN NEEDLE NANO 2ND GEN) 32G X 4 MM MISC USE AS DIRECTED 100 each 3   lisinopril-hydrochlorothiazide (ZESTORETIC) 10-12.5 MG tablet TAKE 1/2 TABLET ONCE A DAY MAY INCREASE TO 1 TABLET DAILY IF DIRECTED BY MD 90 tablet 1   Loratadine 10 MG CAPS Take by mouth.     Multiple Vitamins-Minerals (MULTIVITAMIN WITH MINERALS) tablet Take 1 tablet by mouth daily.     pantoprazole (PROTONIX) 40 MG tablet Take 1 tablet (40 mg total) by mouth daily. 90 tablet 3   pioglitazone (ACTOS) 30 MG tablet TAKE 1 TABLET BY MOUTH EVERY DAY 90 tablet  1   rosuvastatin (CRESTOR) 40 MG tablet TAKE 1 TABLET BY MOUTH EVERY DAY 90 tablet 0   No current facility-administered medications for this visit.    Medication Side Effects: None  Allergies:  Allergies  Allergen Reactions   Buspar [Buspirone] Other (See Comments)    Lip swelling and rash numbness    Augmentin [Amoxicillin-Pot Clavulanate] Nausea And Vomiting   Biaxin [Clarithromycin]    Ceftin [Cefuroxime] Swelling    Eye swelling, shortness of breath    Ciprofloxacin     Developed itching with IV form in the hospital, but has  since taken PO without a problem   Dapagliflozin Other (See Comments)    Burning upon urination. Increased blood sugar.   Doxycycline Itching   Levaquin [Levofloxacin] Hives   Metformin And Related Nausea And Vomiting   Phenazopyridine Hcl Other (See Comments)    Does not remember reaction   Pyridium [Phenazopyridine Hcl]     Does not remember reaction   Sulfa Antibiotics     As a younger person had itching, but has taken more recently and did ok   Latex Rash   Lorabid [Loracarbef] Rash    Past Medical History:  Diagnosis Date   Anxiety    Cancer ([place])    skin- Right shin squamous cell   Depression    Diabetes mellitus    Diabetes mellitus, type II ([place])    GERD (gastroesophageal reflux disease)    Hematoma    on buttock   Hyperlipidemia    Hypertension    Migraine    Seasonal allergies     Past Medical History, Surgical history, Social history, and Family history were reviewed and updated as appropriate.   Please see review of systems for further details on the patient's review from today.   Objective:   Physical Exam:  There were no vitals taken for this visit.  Physical Exam Constitutional:      General: She is not in acute distress. Musculoskeletal:        General: No deformity.  Neurological:     Mental Status: She is alert and oriented to person, place, and time.     Coordination: Coordination normal.  Psychiatric:        Attention and Perception: Attention and perception normal. She does not perceive auditory or visual hallucinations.        Mood and Affect: Mood normal. Mood is not anxious or depressed. Affect is not labile, blunt, angry or inappropriate.        Speech: Speech normal.        Behavior: Behavior normal.        Thought Content: Thought content normal. Thought content is not paranoid or delusional. Thought content does not include homicidal or suicidal ideation. Thought content does not include homicidal or suicidal plan.        Cognition  and Memory: Cognition and memory normal.        Judgment: Judgment normal.     Comments: Insight intact    Lab Review:     Component Value Date/Time   NA 140 06/23/2021 1615   K 4.0 06/23/2021 1615   CL 102 06/23/2021 1615   CO2 27 06/23/2021 1615   GLUCOSE 158 (H) 06/23/2021 1615   BUN 9 06/23/2021 1615   CREATININE 0.87 06/23/2021 1615   CREATININE 0.81 07/06/2020 1128   CALCIUM 9.3 06/23/2021 1615   PROT 6.8 02/10/2021 1000   ALBUMIN 4.1 02/10/2021 1000   AST 36 02/10/2021 1000  ALT 35 02/10/2021 1000   ALKPHOS 121 (H) 02/10/2021 1000   BILITOT 0.4 02/10/2021 1000   GFRNONAA >60 04/02/2020 1243   GFRNONAA >89 04/08/2016 1426   GFRAA >60 04/02/2020 1243   GFRAA >89 04/08/2016 1426       Component Value Date/Time   WBC 6.8 06/23/2021 1615   RBC 4.93 06/23/2021 1615   HGB 13.9 06/23/2021 1615   HCT 42.0 06/23/2021 1615   PLT 319.0 06/23/2021 1615   MCV 85.2 06/23/2021 1615   MCV 78.8 (A) 03/29/2015 0938   MCH 28.5 04/02/2020 1243   MCHC 33.1 06/23/2021 1615   RDW 14.7 06/23/2021 1615   LYMPHSABS 2.9 04/02/2020 1243   MONOABS 0.5 04/02/2020 1243   EOSABS 0.1 04/02/2020 1243   BASOSABS 0.0 04/02/2020 1243    No results found for: POCLITH, LITHIUM   No results found for: PHENYTOIN, PHENOBARB, VALPROATE, CBMZ   .res Assessment: Plan:    Plan:  PDMP reviewed  1. Celexa 551m daily 2. Clonazepam 0.550mBID 3. Rexulti 51m65maily   RTC 3 months  Patient advised to contact office with any questions, adverse effects, or acute worsening in signs and symptoms.  Discussed potential benefits, risk, and side effects of benzodiazepines to include potential risk of tolerance and dependence, as well as possible drowsiness.  Advised patient not to drive if experiencing drowsiness and to take lowest possible effective dose to minimize risk of dependence and tolerance.  Discussed potential metabolic side effects associated with atypical antipsychotics, as well as  potential risk for movement side effects. Advised pt to contact office if movement side effects occur.   Diagnoses and all orders for this visit:  Major depressive disorder, recurrent episode, moderate (HCC) -     brexpiprazole (REXULTI) 1 MG TABS tablet; Take 1 tablet (1 mg total) by mouth daily.  Recurrent major depressive disorder, in partial remission (HCC) -     clonazePAM (KLONOPIN) 0.5 MG tablet; Take 1 tablet (0.5 mg total) by mouth 2 (two) times daily.  Panic disorder -     clonazePAM (KLONOPIN) 0.5 MG tablet; Take 1 tablet (0.5 mg total) by mouth 2 (two) times daily.     Please see After Visit Summary for patient specific instructions.  Future Appointments  Date Time Provider DepAlba/03/2022  9:00 AM Copland, JesGay FillerD [facility]    No orders of the defined types were placed in this encounter.   -------------------------------

## 2021-11-05 ENCOUNTER — Other Ambulatory Visit: Payer: Self-pay | Admitting: Family Medicine

## 2021-11-05 DIAGNOSIS — E785 Hyperlipidemia, unspecified: Secondary | ICD-10-CM

## 2021-11-08 ENCOUNTER — Telehealth: Payer: Self-pay | Admitting: Family Medicine

## 2021-11-08 NOTE — Telephone Encounter (Signed)
Pt stated she is supposed to get 59m of trulicity not the 1.5 listed in her chart.   Medication: Dulaglutide (TRULICITY)   Has the patient contacted their pharmacy? No.  Preferred Pharmacy: [id number] [id number]:  [phone]:  [phone]

## 2021-11-08 NOTE — Telephone Encounter (Signed)
Called to let the pt know that per her 09/02/21 OV her notes stated:  "Her A1c was elevated in August, we did increase dose of Trulicity-however, this caused side effects we had to decrease back down to 1.5 mg She is also taking Actos 30, Basaglar We will check her diabetes progress today"  Had to leave a VM.

## 2021-11-09 MED ORDER — TRULICITY 3 MG/0.5ML ~~LOC~~ SOAJ: 3.0000 mg | SUBCUTANEOUS | 3 refills | Status: DC

## 2021-11-09 NOTE — Telephone Encounter (Signed)
Pt says she was switched back to the Trulicity 3- but I am unable to determine when and where. Okay to refill this dose? She says she has been tolerating it well.

## 2021-11-09 NOTE — Telephone Encounter (Signed)
She went back to 90m of trulicity- refilled for her

## 2021-11-09 NOTE — Addendum Note (Signed)
Addended by: Lamar Blinks C on: 11/09/2021 03:30 PM   Modules accepted: Orders

## 2021-11-16 ENCOUNTER — Telehealth (INDEPENDENT_AMBULATORY_CARE_PROVIDER_SITE_OTHER): Payer: BC Managed Care – PPO | Admitting: Medical

## 2021-11-16 VITALS — Temp 99.9°F

## 2021-11-16 DIAGNOSIS — K529 Noninfective gastroenteritis and colitis, unspecified: Secondary | ICD-10-CM | POA: Diagnosis not present

## 2021-11-16 DIAGNOSIS — R197 Diarrhea, unspecified: Secondary | ICD-10-CM | POA: Diagnosis not present

## 2021-11-16 DIAGNOSIS — R112 Nausea with vomiting, unspecified: Secondary | ICD-10-CM

## 2021-11-16 DIAGNOSIS — R1030 Lower abdominal pain, unspecified: Secondary | ICD-10-CM

## 2021-11-16 MED ORDER — ONDANSETRON HCL 4 MG PO TABS: ORAL_TABLET | ORAL | 0 refills | Status: DC
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
## 2021-11-16 NOTE — Patient Instructions (Addendum)
Recent onset of abdomen pain, diarrhea, nausea and vomiting.  Abdominal pain component seems to be resolving but diarrhea and vomiting persisting.   Did ask and recommend in office visit through medical assistant.  Patient describing too sick to come in and declined?  Did explain to pt diagnostic challenges with doing just virtual visit in light of current complaints.  Discussed with patient she may just have viral gastroenteritis type illness similar to her husband who had recent illness and is now better.  We will treat with conservative measures but if not improving would recommend doing labs in the office and getting stool panel studies.  I will prescribe Zofran for nausea and vomiting.  I recommend Imodium over-the-counter for diarrhea/loose stools.  Recommend hydrating with sugar-free Gatorade or propel since known diabetic.  Eat bland foods as explained.  If symptoms not improving by Thursday then notify us and would try to arrange for CBC, CMP and lipase.  Also would give stool panel studies to return by Friday this week.  Explained that stool panel studies sent out by Friday then we can get quicker result.  Follow-up in 7 days or sooner if needed.

## 2021-11-30 NOTE — Progress Notes (Deleted)
Therapist, music at [facility] ?[address], [address] ?[place], [place] [address] ?336 [phone] ?Fax [phone] ? ?Date:  12/06/2021  ? ?Name:  Natalie Richard   DOB:  02-08-1966   MRN:  [id number] ? ?PCP:  Darreld Mclean, MD  ? ? ?Chief Complaint: No chief complaint on file. ? ? ?History of Present Illness: ? ?Natalie Richard is a 56 y.o. very pleasant female patient who presents with the following: ? ?Patient seen today for periodic follow-up ?Most recent visit with myself was in December- History of uncontrolled diabetes, hypertension, [place], obesity, hyperlipidemia, insomnia/anxiety and depression ?  ?She follows up with her mental health care provider on a regular basis- Deloria Lair NP ? ?At last visit we gave first dose of Shingrix and she had a presyncopal episode ?A1c was also elevated.  We increased Trulicity from 1.5 to 3 mg ? ?Can recheck A1c today ?Lab Results  ?Component Value Date  ? HGBA1C 8.6 (H) 09/03/2021  ? ?Celexa ?Rexulti ?Trulicity 3 ?Basaglar?  Units ?Actos 30 ?Crestor ?Lisinopril/HCTZ ?Patient Active Problem List  ? Diagnosis Date Noted  ? History of squamous cell carcinoma in situ (SCCIS) of skin 12/11/2019  ? NASH (nonalcoholic steatohepatitis) 07/26/2015  ? BMI 39.0-39.9,adult 03/19/2012  ? Hematoma 03/19/2012  ? HTN (hypertension), benign 01/09/2012  ? Osteopenia 01/09/2012  ? Migraines 01/09/2012  ? Insomnia 01/09/2012  ? Anxiety and depression 01/09/2012  ? Hyperlipidemia 01/09/2012  ? Diabetes mellitus 01/09/2012  ? Allergic rhinitis 01/09/2012  ? ? ?Past Medical History:  ?Diagnosis Date  ? Anxiety   ? Cancer [facility])   ? skin- Right shin squamous cell  ? Depression   ? Diabetes mellitus   ? Diabetes mellitus, type II ([place])   ? GERD (gastroesophageal reflux disease)   ? Hematoma   ? on buttock  ? Hyperlipidemia   ? Hypertension   ? Migraine   ? Seasonal allergies   ? ? ?Past Surgical History:  ?Procedure Laterality Date  ? ABDOMINAL HYSTERECTOMY  2004  ? PCOS   ? EYE SURGERY    ? INNER EAR SURGERY Left 08/2016  ? ? ?Social History  ? ?Tobacco Use  ? Smoking status: Never  ? Smokeless tobacco: Never  ?Vaping Use  ? Vaping Use: Never used  ?Substance Use Topics  ? Alcohol use: Not Currently  ?  Alcohol/week: 8.0 - 10.0 standard drinks  ?  Types: 6 Cans of beer, 2 Shots of liquor per week  ? Drug use: No  ? ? ?Family History  ?Problem Relation Age of Onset  ? Cancer Mother 28  ?     colon  ? Diabetes Mother   ? Rheum arthritis Mother   ? Dementia Mother   ? Depression Mother   ? Cancer Father 83  ?     bone and prostate  ? Cancer Maternal Grandmother   ?     breast  ? Diabetes Maternal Grandfather   ? Depression Cousin   ? ? ?Allergies  ?Allergen Reactions  ? Buspar [Buspirone] Other (See Comments)  ?  Lip swelling and rash numbness ?  ? Augmentin [Amoxicillin-Pot Clavulanate] Nausea And Vomiting  ? Biaxin [Clarithromycin]   ? Ceftin [Cefuroxime] Swelling  ?  Eye swelling, shortness of breath ?  ? Ciprofloxacin   ?  Developed itching with IV form in the hospital, but has since taken PO without a problem  ? Dapagliflozin Other (See Comments)  ?  Burning upon  urination. Increased blood sugar.  ? Doxycycline Itching  ? Levaquin [Levofloxacin] Hives  ? Metformin And Related Nausea And Vomiting  ? Phenazopyridine Hcl Other (See Comments)  ?  Does not remember reaction  ? Pyridium [Phenazopyridine Hcl]   ?  Does not remember reaction  ? Sulfa Antibiotics   ?  As a younger person had itching, but has taken more recently and did ok  ? Latex Rash  ? Lorabid [Loracarbef] Rash  ? ? ?Medication list has been reviewed and updated. ? ?Current Outpatient Medications on File Prior to Visit  ?Medication Sig Dispense Refill  ? amoxicillin (AMOXIL) 500 MG capsule Take by mouth.    ? brexpiprazole (REXULTI) 1 MG TABS tablet Take 1 tablet (1 mg total) by mouth daily. 30 tablet 5  ? citalopram (CELEXA) 40 MG tablet Take 1 tablet (40 mg total) by mouth daily. 90 tablet 3  ? clonazePAM (KLONOPIN)  0.5 MG tablet Take 1 tablet (0.5 mg total) by mouth 2 (two) times daily. 60 tablet 2  ? Dulaglutide (TRULICITY) 3 GS/8.1JS SOPN Inject 3 mg as directed once a week. 6 mL 3  ? glucose blood (ONETOUCH VERIO) test strip Use as instructed- may check blood sugar up to twice a day. 100 each 12  ? glucose blood test strip Use to check glucose 1-2x daily 100 each 12  ? Insulin Glargine (BASAGLAR KWIKPEN) 100 UNIT/ML START WITH 10 UNITS DAILY, INCREASE AS DIRECTED BY MD. ESTIMATE FINAL DOSE WILL BE 25 UNITS DAILY 15 mL 5  ? Insulin Pen Needle (BD PEN NEEDLE NANO 2ND GEN) 32G X 4 MM MISC USE AS DIRECTED 100 each 3  ? lisinopril-hydrochlorothiazide (ZESTORETIC) 10-12.5 MG tablet TAKE 1/2 TABLET ONCE A DAY MAY INCREASE TO 1 TABLET DAILY IF DIRECTED BY MD 90 tablet 1  ? Loratadine 10 MG CAPS Take by mouth.    ? Multiple Vitamins-Minerals (MULTIVITAMIN WITH MINERALS) tablet Take 1 tablet by mouth daily.    ? ondansetron (ZOFRAN) 4 MG tablet 1 tab po tid prn nauseau 20 tablet 0  ? pantoprazole (PROTONIX) 40 MG tablet Take 1 tablet (40 mg total) by mouth daily. 90 tablet 3  ? pioglitazone (ACTOS) 30 MG tablet TAKE 1 TABLET BY MOUTH EVERY DAY 90 tablet 1  ? rosuvastatin (CRESTOR) 40 MG tablet TAKE 1 TABLET BY MOUTH EVERY DAY 90 tablet 0  ? ?No current facility-administered medications on file prior to visit.  ? ? ?Review of Systems: ? ?As per HPI- otherwise negative. ? ? ?Physical Examination: ?There were no vitals filed for this visit. ?There were no vitals filed for this visit. ?There is no height or weight on file to calculate BMI. ?Ideal Body Weight:   ? ?GEN: no acute distress. ?HEENT: Atraumatic, Normocephalic.  ?Ears and Nose: No external deformity. ?CV: RRR, No M/G/R. No JVD. No thrill. No extra heart sounds. ?PULM: CTA B, no wheezes, crackles, rhonchi. No retractions. No resp. distress. No accessory muscle use. ?ABD: S, NT, ND, +BS. No rebound. No HSM. ?EXTR: No c/c/e ?PSYCH: Normally interactive. Conversant.   ? ? ?Assessment and Plan: ?*** ? ?Signed ?Lamar Blinks, MD ? ?

## 2021-11-30 NOTE — Patient Instructions (Addendum)
It was good to see you again today, I will be in touch with your labs asap ?Samples of trulicity 1.5 today- can increase back to 3 mg once the pharmacy has back in stock ? ?Assuming all is well please see me in about 4 months ?We can do your 2nd shingles vaccine next visit- please eat a solid meal prior to appt, and if possible bring a friend or family member with you in case you feel faint ? ? ?

## 2021-12-06 ENCOUNTER — Encounter: Payer: Self-pay | Admitting: Family Medicine

## 2021-12-06 ENCOUNTER — Ambulatory Visit (INDEPENDENT_AMBULATORY_CARE_PROVIDER_SITE_OTHER): Payer: BC Managed Care – PPO | Admitting: Family Medicine

## 2021-12-06 VITALS — BP 122/70 | HR 90 | Temp 98.5°F | Ht 66.0 in | Wt 225.0 lb

## 2021-12-06 DIAGNOSIS — E1165 Type 2 diabetes mellitus with hyperglycemia: Secondary | ICD-10-CM | POA: Diagnosis not present

## 2021-12-06 DIAGNOSIS — K7581 Nonalcoholic steatohepatitis (NASH): Secondary | ICD-10-CM | POA: Diagnosis not present

## 2021-12-06 DIAGNOSIS — E782 Mixed hyperlipidemia: Secondary | ICD-10-CM

## 2021-12-06 DIAGNOSIS — E559 Vitamin D deficiency, unspecified: Secondary | ICD-10-CM

## 2021-12-06 DIAGNOSIS — I10 Essential (primary) hypertension: Secondary | ICD-10-CM | POA: Diagnosis not present

## 2021-12-06 LAB — COMPREHENSIVE METABOLIC PANEL
ALT: 43 U/L — ABNORMAL HIGH (ref 0–35)
AST: 47 U/L — ABNORMAL HIGH (ref 0–37)
Albumin: 4.5 g/dL (ref 3.5–5.2)
Alkaline Phosphatase: 153 U/L — ABNORMAL HIGH (ref 39–117)
BUN: 9 mg/dL (ref 6–23)
CO2: 27 mEq/L (ref 19–32)
Calcium: 9.3 mg/dL (ref 8.4–10.5)
Chloride: 94 mEq/L — ABNORMAL LOW (ref 96–112)
Creatinine, Ser: 0.86 mg/dL (ref 0.40–1.20)
GFR: 74.99 mL/min (ref >60.00)
Glucose, Bld: 413 mg/dL — ABNORMAL HIGH (ref 70–99)
Potassium: 3.7 mEq/L (ref 3.5–5.1)
Sodium: 133 mEq/L — ABNORMAL LOW (ref 135–145)
Total Bilirubin: 0.5 mg/dL (ref 0.2–1.2)
Total Protein: 6.8 g/dL (ref 6.0–8.3)

## 2021-12-06 LAB — CBC
HCT: 43.5 % (ref 36.0–46.0)
Hemoglobin: 14.6 g/dL (ref 12.0–15.0)
MCHC: 33.6 g/dL (ref 30.0–36.0)
MCV: 84.3 fl (ref 78.0–100.0)
Platelets: 306 10*3/uL (ref 150.0–400.0)
RBC: 5.16 Mil/uL — ABNORMAL HIGH (ref 3.87–5.11)
RDW: 14.8 % (ref 11.5–15.5)
WBC: 5.8 10*3/uL (ref 4.0–10.5)

## 2021-12-06 LAB — LIPID PANEL
Cholesterol: 231 mg/dL — ABNORMAL HIGH (ref 0–200)
HDL: 49.6 mg/dL (ref >39.00)
NonHDL: 180.93
Total CHOL/HDL Ratio: 5
Triglycerides: 326 mg/dL — ABNORMAL HIGH (ref 0.0–149.0)
VLDL: 65.2 mg/dL — ABNORMAL HIGH (ref 0.0–40.0)

## 2021-12-06 LAB — HEMOGLOBIN A1C: Hgb A1c MFr Bld: 12.5 % — ABNORMAL HIGH (ref 4.6–6.5)

## 2021-12-06 LAB — VITAMIN D 25 HYDROXY (VIT D DEFICIENCY, FRACTURES): VITD: 30.92 ng/mL (ref 30.00–100.00)

## 2021-12-06 LAB — LDL CHOLESTEROL, DIRECT: Direct LDL: 152 mg/dL

## 2021-12-06 NOTE — Progress Notes (Addendum)
[place] at [facility] [address] [place], [place] [address] [phone]  Date:  12/06/2021   Name:  Natalie Richard   DOB:  1966/06/17   MRN:  [id number]  PCP:  Darreld Mclean, MD    Chief Complaint: Diabetes (3 m f/u )   History of Present Illness:  Natalie Richard is a 56 y.o. very pleasant female patient who presents with the following:  Patient seen today for periodic follow-up Most recent visit with myself was in December- History of uncontrolled diabetes, hypertension, [place], obesity, hyperlipidemia, insomnia/anxiety and depression   She follows up with her mental health care provider on a regular basis- Deloria Lair NP.  She feels like her mood is pretty good She is sleeping well   They just got a 57 month old puppy- they lost both of their dogs in the last few months Yellow lab managed per oral.  She is very active and brings a lot of joy  At last visit we gave first dose of Shingrix and she had a presyncopal episode A1c was also elevated.  We increased Trulicity from 1.5 to 3 mg She has been out of trulicity for 3 weeks!  It is on backorder at the pharmacy  Can recheck A1c today Lab Results  Component Value Date   HGBA1C 8.6 (H) 09/03/2021   Celexa Rexulti Trulicity 3 Basaglar?  Units- she is using 32 units  Actos 30 Crestor Lisinopril/HCTZ Patient Active Problem List   Diagnosis Date Noted   History of squamous cell carcinoma in situ (SCCIS) of skin 12/11/2019   NASH (nonalcoholic steatohepatitis) 07/26/2015   BMI 39.0-39.9,adult 03/19/2012   Hematoma 03/19/2012   HTN (hypertension), benign 01/09/2012   Osteopenia 01/09/2012   Migraines 01/09/2012   Insomnia 01/09/2012   Anxiety and depression 01/09/2012   Hyperlipidemia 01/09/2012   Diabetes mellitus 01/09/2012   Allergic rhinitis 01/09/2012    Past Medical History:  Diagnosis Date   Anxiety    Cancer ([place])    skin- Right shin  squamous cell   Depression    Diabetes mellitus    Diabetes mellitus, type II ([place])    GERD (gastroesophageal reflux disease)    Hematoma    on buttock   Hyperlipidemia    Hypertension    Migraine    Seasonal allergies     Past Surgical History:  Procedure Laterality Date   ABDOMINAL HYSTERECTOMY  2004   PCOS   EYE SURGERY     INNER EAR SURGERY Left 08/2016    Social History   Tobacco Use   Smoking status: Never   Smokeless tobacco: Never  Vaping Use   Vaping Use: Never used  Substance Use Topics   Alcohol use: Not Currently    Alcohol/week: 8.0 - 10.0 standard drinks    Types: 6 Cans of beer, 2 Shots of liquor per week   Drug use: No    Family History  Problem Relation Age of Onset   Cancer Mother 57       colon   Diabetes Mother    Rheum arthritis Mother    Dementia Mother    Depression Mother    Cancer Father 52       bone and prostate   Cancer Maternal Grandmother        breast   Diabetes Maternal Grandfather    Depression Cousin     Allergies  Allergen Reactions   Buspar [Buspirone]  Other (See Comments)    Lip swelling and rash numbness    Augmentin [Amoxicillin-Pot Clavulanate] Nausea And Vomiting   Biaxin [Clarithromycin]    Ceftin [Cefuroxime] Swelling    Eye swelling, shortness of breath    Ciprofloxacin     Developed itching with IV form in the hospital, but has since taken PO without a problem   Dapagliflozin Other (See Comments)    Burning upon urination. Increased blood sugar.   Doxycycline Itching   Levaquin [Levofloxacin] Hives   Metformin And Related Nausea And Vomiting   Phenazopyridine Hcl Other (See Comments)    Does not remember reaction   Pyridium [Phenazopyridine Hcl]     Does not remember reaction   Sulfa Antibiotics     As a younger person had itching, but has taken more recently and did ok   Latex Rash   Lorabid [Loracarbef] Rash    Medication list has been reviewed and updated.  Current Outpatient Medications on  File Prior to Visit  Medication Sig Dispense Refill   amoxicillin (AMOXIL) 500 MG capsule Take by mouth.     brexpiprazole (REXULTI) 1 MG TABS tablet Take 1 tablet (1 mg total) by mouth daily. 30 tablet 5   citalopram (CELEXA) 40 MG tablet Take 1 tablet (40 mg total) by mouth daily. 90 tablet 3   clonazePAM (KLONOPIN) 0.5 MG tablet Take 1 tablet (0.5 mg total) by mouth 2 (two) times daily. 60 tablet 2   Insulin Glargine (BASAGLAR KWIKPEN) 100 UNIT/ML START WITH 10 UNITS DAILY, INCREASE AS DIRECTED BY MD. ESTIMATE FINAL DOSE WILL BE 25 UNITS DAILY 15 mL 5   Insulin Pen Needle (BD PEN NEEDLE NANO 2ND GEN) 32G X 4 MM MISC USE AS DIRECTED 100 each 3   lisinopril-hydrochlorothiazide (ZESTORETIC) 10-12.5 MG tablet TAKE 1/2 TABLET ONCE A DAY MAY INCREASE TO 1 TABLET DAILY IF DIRECTED BY MD 90 tablet 1   Loratadine 10 MG CAPS Take by mouth.     Multiple Vitamins-Minerals (MULTIVITAMIN WITH MINERALS) tablet Take 1 tablet by mouth daily.     pantoprazole (PROTONIX) 40 MG tablet Take 1 tablet (40 mg total) by mouth daily. 90 tablet 3   pioglitazone (ACTOS) 30 MG tablet TAKE 1 TABLET BY MOUTH EVERY DAY 90 tablet 1   rosuvastatin (CRESTOR) 40 MG tablet TAKE 1 TABLET BY MOUTH EVERY DAY 90 tablet 0   Dulaglutide (TRULICITY) 3 GL/8.7FI SOPN Inject 3 mg as directed once a week. (Patient not taking: Reported on 12/06/2021) 6 mL 3   glucose blood (ONETOUCH VERIO) test strip Use as instructed- may check blood sugar up to twice a day. (Patient not taking: Reported on 12/06/2021) 100 each 12   glucose blood test strip Use to check glucose 1-2x daily (Patient not taking: Reported on 12/06/2021) 100 each 12   ondansetron (ZOFRAN) 4 MG tablet 1 tab po tid prn nauseau (Patient not taking: Reported on 12/06/2021) 20 tablet 0   No current facility-administered medications on file prior to visit.    Review of Systems:  As per HPI- otherwise negative.   Physical Examination: Vitals:   12/06/21 0904  BP: 122/70  Pulse: 90   Temp: 98.5 F (36.9 C)  SpO2: 95%   Vitals:   12/06/21 0904  Weight: 225 lb (102.1 kg)  Height: 5' 6"  (1.676 m)   Body mass index is 36.32 kg/m. Ideal Body Weight: Weight in (lb) to have BMI = 25: 154.6  GEN: no acute distress.  Obese, looks well HEENT:  Atraumatic, Normocephalic.  Ears and Nose: No external deformity. CV: RRR, No M/G/R. No JVD. No thrill. No extra heart sounds. PULM: CTA B, no wheezes, crackles, rhonchi. No retractions. No resp. distress. No accessory muscle use. EXTR: No c/c/e PSYCH: Normally interactive. Conversant.   Provided patient with 4 sample pens of Trulicity, 1.5 mg Assessment and Plan: Type 2 diabetes mellitus with hyperglycemia, without long-term current use of insulin ([place]) - Plan: Comprehensive metabolic panel, Hemoglobin A1c  HTN (hypertension), benign - Plan: CBC, Comprehensive metabolic panel  NASH (nonalcoholic steatohepatitis) - Plan: Comprehensive metabolic panel  Mixed hyperlipidemia - Plan: Lipid panel  Vitamin D deficiency - Plan: VITAMIN D 25 Hydroxy (Vit-D Deficiency, Fractures)  Patient seen today for follow-up visit.  A1c pending.  Of note she has been out of Trulicity for about 4 weeks.  I gave her some samples of 1.5 mg strength, she can use this for the next month until the 3 mg strength is hopefully available again We can make any further necessary adjustments pending her A1c  Blood pressure under good control-continue current medications.  Check electrolytes and renal function Follow-up on lipids, liver function, vitamin D  Will plan further follow- up pending labs.   Signed Lamar Blinks, MD  Received her labs as below,called pt Her glucose is quite high She just took a dose of trulicity Went over gradual increase in her insulin (can go up by about 2 units every 2 days as long as fasting glucose over 200- however as she just started on trulicity again we will not be overly aggressive as her glucose may  drop  Addendum 3/7, called patient to check on her progress She did not check her blood sugar today as of yet but notes she is feeling better.  She was able to get Trulicity 3 mg from her pharmacy Advised her to use the 1.5 for about 2 weeks prior to going back to 3 to minimize GI side effects  Otherwise, she will be sure to contact me with any questions or concerns about her blood sugars.  Assuming she is doing okay we will check back in the clinic in 3 months  Results for orders placed or performed in visit on 12/06/21  CBC  Result Value Ref Range   WBC 5.8 4.0 - 10.5 K/uL   RBC 5.16 (H) 3.87 - 5.11 Mil/uL   Platelets 306.0 150.0 - 400.0 K/uL   Hemoglobin 14.6 12.0 - 15.0 g/dL   HCT 43.5 36.0 - 46.0 %   MCV 84.3 78.0 - 100.0 fl   MCHC 33.6 30.0 - 36.0 g/dL   RDW 14.8 11.5 - 15.5 %  Comprehensive metabolic panel  Result Value Ref Range   Sodium 133 (L) 135 - 145 mEq/L   Potassium 3.7 3.5 - 5.1 mEq/L   Chloride 94 (L) 96 - 112 mEq/L   CO2 27 19 - 32 mEq/L   Glucose, Bld 413 (H) 70 - 99 mg/dL   BUN 9 6 - 23 mg/dL   Creatinine, Ser 0.86 0.40 - 1.20 mg/dL   Total Bilirubin 0.5 0.2 - 1.2 mg/dL   Alkaline Phosphatase 153 (H) 39 - 117 U/L   AST 47 (H) 0 - 37 U/L   ALT 43 (H) 0 - 35 U/L   Total Protein 6.8 6.0 - 8.3 g/dL   Albumin 4.5 3.5 - 5.2 g/dL   GFR 74.99 >60.00 mL/min   Calcium 9.3 8.4 - 10.5 mg/dL  Hemoglobin A1c  Result Value Ref Range  Hgb A1c MFr Bld 12.5 (H) 4.6 - 6.5 %  Lipid panel  Result Value Ref Range   Cholesterol 231 (H) 0 - 200 mg/dL   Triglycerides 326.0 (H) 0.0 - 149.0 mg/dL   HDL 49.60 >39.00 mg/dL   VLDL 65.2 (H) 0.0 - 40.0 mg/dL   Total CHOL/HDL Ratio 5    NonHDL 180.93   VITAMIN D 25 Hydroxy (Vit-D Deficiency, Fractures)  Result Value Ref Range   VITD 30.92 30.00 - 100.00 ng/mL  LDL cholesterol, direct  Result Value Ref Range   Direct LDL 152.0 mg/dL

## 2021-12-08 ENCOUNTER — Telehealth: Payer: Self-pay

## 2021-12-08 NOTE — Telephone Encounter (Signed)
Pt was seen 2 days ago: note stated: "Received her labs as below,called pt ?Her glucose is quite high ?She just took a dose of trulicity ?Went over gradual increase in her insulin (can go up by about 2 units every 2 days as long as fasting glucose over 200- however as she just started on trulicity again we will not be overly aggressive as her glucose may drop" ? ? ?Please advise. ?

## 2021-12-08 NOTE — Telephone Encounter (Signed)
Called pt- ok to increase insulin to 36 units currently using 34) ?

## 2021-12-08 NOTE — Telephone Encounter (Signed)
Caller Name Natalie Richard ?Caller Phone Number [phone] ?Patient Name Natalie Richard ?Patient DOB [birth year] ?Call Type Message Only Information Provided ?Reason for Call Request for General Office Information ?Initial Comment Caller states she would like to provide the doctor with glucose number which is 364. ?Additional Comment Office hours provided ?Disp. Time Disposition Final User ?12/08/2021 8:26:29 AM General Information Provided Yes Achilles Dunk ?Call Closed By: Achilles Dunk ?Transaction Date/Time: 12/08/2021 8:23:56 AM (ET) ?

## 2021-12-14 ENCOUNTER — Telehealth: Payer: Self-pay | Admitting: Family Medicine

## 2021-12-14 DIAGNOSIS — E1165 Type 2 diabetes mellitus with hyperglycemia: Secondary | ICD-10-CM

## 2021-12-14 NOTE — Telephone Encounter (Signed)
Nurse Assessment ?Nurse: Doyle Askew, RN, Beth Date/Time Natalie Richard Time): 12/14/2021 10:49:59 AM ?Confirm and document reason for call. If ?symptomatic, describe symptoms. ?---Caller states her fasting blood sugar is 303. Caller ?states a she feels dizzy, a little confused at times, ?thirsty. ?Does the patient have any new or worsening ?symptoms? ---Yes ?Will a triage be completed? ---Yes ?Related visit to physician within the last 2 weeks? ---Yes ?Does the PT have any chronic conditions? (i.e. ?diabetes, asthma, this includes High risk factors for ?pregnancy, etc.) ?---Yes ?List chronic conditions. ---diabetes ?Is this a behavioral health or substance abuse call? ---No ?Guidelines ?Guideline Title Affirmed Question Affirmed Notes Nurse Date/Time (Eastern ?Time) ?Diabetes - High ?Blood Sugar ?[1] Blood glucose ?> 300 mg/dL (16.7 ?mmol/L) AND [2] ?uses insulin (e.g., ?insulin-dependent, ?all people with type 1 ?diabetes) ?Doyle Askew, RN, Natalie Richard 12/14/2021 10:51:20 ?AM ?Disp. Time (Eastern ?Time) Disposition Final User ?12/14/2021 10:58:35 AM Home Care Yes Doyle Askew, RN, Beth ?PLEASE NOTE: All timestamps contained within this report are represented as [place] Standard Time. ?CONFIDENTIALTY NOTICE: This fax transmission is intended only for the addressee. It contains information that is legally privileged, confidential or ?otherwise protected from use or disclosure. If you are not the intended recipient, you are strictly prohibited from reviewing, disclosing, copying using ?or disseminating any of this information or taking any action in reliance on or regarding this information. If you have received this fax in error, please ?notify us immediately by telephone so that we can arrange for its return to [place]. Phone: [phone], Toll-Free: [phone], Fax: [phone] ?Page: 2 of 2 ?Call Id: [id number] ?Caller Disagree/Comply Comply ?Caller Understands Yes ?PreDisposition Call Doctor ?Care Advice Given Per Guideline ?HOME CARE: * You should  be able to treat this at home. * If you have a diabetes specialist (doctor, NP, PA), call the specialist ?within the next 2 to 3 days. ALTERNATE DISPOSITION - CALL YOUR DIABETES SPECIALIST: * Call the office when it is ?open. TREATMENT - LIQUIDS: * Drink at least one glass (8 oz; 240 ml) of water per hour for the next 4 hours. Reason: Adequate ?hydration will help lower blood sugar. * Try to drink 6 to 8 glasses of water each day. CONTINUE INSULIN: * Follow the insulin ?dosing plan recommended by your doctor (or NP/PA). * You become worse * You have more questions * Rapid breathing occurs * ?Vomiting lasting over 4 hours or unable to drink any fluids * Blood glucose over 300 mg/dL (16.7 mmol/L), two or more times in a ?row. * Urine ketones become moderate or large (or more than 1+); if you check blood ketones, blood ketone test is over 1.4 mmol/L ?CALL BACK IF: CARE ADVICE given per Diabetes - High Blood Sugar (Adult) guideline. * Keep a log and show it to your doctor ?(or NP/PA) at your next office visit. ?Comments ?User: Melene Muller, RN Date/Time Natalie Richard Time): 12/14/2021 10:56:05 AM ?Current BS reading 277 ?

## 2021-12-14 NOTE — Telephone Encounter (Signed)
Gave her a call back ?Most recent A1c was 12.5- average sugar about 300- 350 ? ?Lab Results  ?Component Value Date  ? HGBA1C 12.5 (H) 12/06/2021  ? ?She notes that she is not feeling great, feels fatigued and thirsty.  I offered to have her seen today or to have her go to the emergency department.  She would prefer to see me tomorrow, schedule her for 1140 ? ?She is currently taking Trulicity 1.5, Actos, insulin 36 units.  I advised her to go ahead and take 40 units of insulin now.  Drink plenty of fluids.  She does have an endocrinology appointment but is not until May ? ?We will see her tomorrow, if getting worse in the meantime she will seek care ?

## 2021-12-14 NOTE — Progress Notes (Signed)
Therapist, music at [facility] ?[address] [place], [address] ?[place], [place] [address] ?336 [phone] ?Fax [phone] ? ?Date:  12/15/2021  ? ?Name:  Natalie Richard   DOB:  [birth year]   MRN:  [id number] ? ?PCP:  Darreld Mclean, MD  ? ? ?Chief Complaint: Diabetes ? ? ?History of Present Illness: ? ?Natalie Richard is a 56 y.o. very pleasant female patient who presents with the following: ? ?Patient who has been struggling recently with difficult to control diabetes.  Seen by myself recently, A1c noted to be significantly elevated -A1c in December-8.6% ? ?Lab Results  ?Component Value Date  ? HGBA1C 12.5 (H) 12/06/2021  ? ? ?At her last visit she had run out of her Trulicity-she had been on 3 mg weekly prior to running out.  I provided her some samples of 1.5 mg and we have had her gradually increasing her insulin dose.  She contacted [place] yesterday with concern of blood sugar still 300, not feeling great.  I had her increase insulin from 36 to 40 units and follow-up today ?Her most recent dose of trulicty was on Sunday- 1.5 units  ? ?She does have an endocrinology appointment but not for another 2 months ? ?Her glucose this am was 318 ?We will check her glucose here today as well to make sure her meter is accurate  ? ?She is not vomiting, but does not feel well in general  ?Notes her vision is blurry ?She notes that she feels weak, little bit dizzy.  She has not felt this way with her diabetes previously ?She notes she is quite thirsty.  She feels hungry but no food seems to appeal ? ?Wt Readings from Last 3 Encounters:  ?12/15/21 224 lb 9.6 oz (101.9 kg)  ?12/06/21 225 lb (102.1 kg)  ?09/02/21 239 lb 12.8 oz (108.8 kg)  ? ? ? ?Patient Active Problem List  ? Diagnosis Date Noted  ? History of squamous cell carcinoma in situ (SCCIS) of skin 12/11/2019  ? NASH (nonalcoholic steatohepatitis) 07/26/2015  ? BMI 39.0-39.9,adult 03/19/2012  ? Hematoma 03/19/2012  ? HTN (hypertension), benign 01/09/2012   ? Osteopenia 01/09/2012  ? Migraines 01/09/2012  ? Insomnia 01/09/2012  ? Anxiety and depression 01/09/2012  ? Hyperlipidemia 01/09/2012  ? Diabetes mellitus 01/09/2012  ? Allergic rhinitis 01/09/2012  ? ? ?Past Medical History:  ?Diagnosis Date  ? Anxiety   ? Cancer [facility])   ? skin- Right shin squamous cell  ? Depression   ? Diabetes mellitus   ? Diabetes mellitus, type II ([place])   ? GERD (gastroesophageal reflux disease)   ? Hematoma   ? on buttock  ? Hyperlipidemia   ? Hypertension   ? Migraine   ? Seasonal allergies   ? ? ?Past Surgical History:  ?Procedure Laterality Date  ? ABDOMINAL HYSTERECTOMY  2004  ? PCOS  ? EYE SURGERY    ? INNER EAR SURGERY Left 08/2016  ? ? ?Social History  ? ?Tobacco Use  ? Smoking status: Never  ? Smokeless tobacco: Never  ?Vaping Use  ? Vaping Use: Never used  ?Substance Use Topics  ? Alcohol use: Not Currently  ?  Alcohol/week: 8.0 - 10.0 standard drinks  ?  Types: 6 Cans of beer, 2 Shots of liquor per week  ? Drug use: No  ? ? ?Family History  ?Problem Relation Age of Onset  ? Cancer Mother 41  ?     colon  ? Diabetes  Mother   ? Rheum arthritis Mother   ? Dementia Mother   ? Depression Mother   ? Cancer Father 24  ?     bone and prostate  ? Cancer Maternal Grandmother   ?     breast  ? Diabetes Maternal Grandfather   ? Depression Cousin   ? ? ?Allergies  ?Allergen Reactions  ? Buspar [Buspirone] Other (See Comments)  ?  Lip swelling and rash numbness ?  ? Augmentin [Amoxicillin-Pot Clavulanate] Nausea And Vomiting  ? Biaxin [Clarithromycin]   ? Ceftin [Cefuroxime] Swelling  ?  Eye swelling, shortness of breath ?  ? Ciprofloxacin   ?  Developed itching with IV form in the hospital, but has since taken PO without a problem  ? Dapagliflozin Other (See Comments)  ?  Burning upon urination. Increased blood sugar.  ? Doxycycline Itching  ? Levaquin [Levofloxacin] Hives  ? Metformin And Related Nausea And Vomiting  ? Phenazopyridine Hcl Other (See Comments)  ?  Does not remember reaction   ? Pyridium [Phenazopyridine Hcl]   ?  Does not remember reaction  ? Sulfa Antibiotics   ?  As a younger person had itching, but has taken more recently and did ok  ? Latex Rash  ? Lorabid [Loracarbef] Rash  ? ? ?Medication list has been reviewed and updated. ? ?Current Outpatient Medications on File Prior to Visit  ?Medication Sig Dispense Refill  ? brexpiprazole (REXULTI) 1 MG TABS tablet Take 1 tablet (1 mg total) by mouth daily. 30 tablet 5  ? citalopram (CELEXA) 40 MG tablet Take 1 tablet (40 mg total) by mouth daily. 90 tablet 3  ? clonazePAM (KLONOPIN) 0.5 MG tablet Take 1 tablet (0.5 mg total) by mouth 2 (two) times daily. 60 tablet 2  ? Insulin Glargine (BASAGLAR KWIKPEN) 100 UNIT/ML START WITH 10 UNITS DAILY, INCREASE AS DIRECTED BY MD. ESTIMATE FINAL DOSE WILL BE 25 UNITS DAILY 15 mL 5  ? Insulin Pen Needle (BD PEN NEEDLE NANO 2ND GEN) 32G X 4 MM MISC USE AS DIRECTED 100 each 3  ? lisinopril-hydrochlorothiazide (ZESTORETIC) 10-12.5 MG tablet TAKE 1/2 TABLET ONCE A DAY MAY INCREASE TO 1 TABLET DAILY IF DIRECTED BY MD 90 tablet 1  ? Loratadine 10 MG CAPS Take by mouth.    ? Multiple Vitamins-Minerals (MULTIVITAMIN WITH MINERALS) tablet Take 1 tablet by mouth daily.    ? pantoprazole (PROTONIX) 40 MG tablet Take 1 tablet (40 mg total) by mouth daily. 90 tablet 3  ? pioglitazone (ACTOS) 30 MG tablet TAKE 1 TABLET BY MOUTH EVERY DAY 90 tablet 1  ? rosuvastatin (CRESTOR) 40 MG tablet TAKE 1 TABLET BY MOUTH EVERY DAY 90 tablet 0  ? Dulaglutide (TRULICITY) 3 GB/1.5VV SOPN Inject 3 mg as directed once a week. (Patient not taking: Reported on 12/06/2021) 6 mL 3  ? glucose blood (ONETOUCH VERIO) test strip Use as instructed- may check blood sugar up to twice a day. (Patient not taking: Reported on 12/06/2021) 100 each 12  ? glucose blood test strip Use to check glucose 1-2x daily (Patient not taking: Reported on 12/06/2021) 100 each 12  ? ondansetron (ZOFRAN) 4 MG tablet 1 tab po tid prn nauseau (Patient not taking:  Reported on 12/06/2021) 20 tablet 0  ? ?No current facility-administered medications on file prior to visit.  ? ? ?Review of Systems: ? ?As per HPI- otherwise negative. ? ? ?Physical Examination: ?Vitals:  ? 12/15/21 1149  ?BP: 118/80  ?Pulse: (!) 106  ?Resp:  20  ?SpO2: 93%  ? ?Vitals:  ? 12/15/21 1149  ?Weight: 224 lb 9.6 oz (101.9 kg)  ?Height: 5' 6"  (1.676 m)  ? ?Body mass index is 36.25 kg/m?. ?Ideal Body Weight: Weight in (lb) to have BMI = 25: 154.6 ? ?GEN: no acute distress.  Does not appear to feel great ?HEENT: Atraumatic, Normocephalic.  ?Ears and Nose: No external deformity. ?CV: RRR-mildly tachycardic, No M/G/R. No JVD. No thrill. No extra heart sounds. ?PULM: CTA B, no wheezes, crackles, rhonchi. No retractions. No resp. distress. No accessory muscle use. ?ABD: S, NT, ND ?EXTR: No c/c/e ?PSYCH: Normally interactive. Conversant.  ? ?Results for orders placed or performed in visit on 12/15/21  ?POCT urinalysis dipstick  ?Result Value Ref Range  ? Color, UA yellow yellow  ? Clarity, UA clear clear  ? Glucose, UA >=1,000 (A) negative mg/dL  ? Bilirubin, UA negative negative  ? Ketones, POC UA trace (5) (A) negative mg/dL  ? Spec Grav, UA 1.025 1.010 - 1.025  ? Blood, UA negative negative  ? pH, UA 6.0 5.0 - 8.0  ? Protein Ur, POC trace (A) negative mg/dL  ? Urobilinogen, UA 0.2 0.2 or 1.0 E.U./dL  ? Nitrite, UA Negative Negative  ? Leukocytes, UA Negative Negative  ?POCT glucose (manual entry)  ?Result Value Ref Range  ? POC Glucose 364 (A) 70 - 99 mg/dl  ? ? ?Assessment and Plan: ?Type 2 diabetes mellitus with hyperglycemia, without long-term current use of insulin ([place]) - Plan: Basic metabolic panel, POCT urinalysis dipstick, POCT glucose (manual entry) ? ?Patient today with poorly controlled diabetes, not feeling well ?Blood sugar by her meter and on her meter was approximately 350.  She is taking 40 units of long-acting insulin, Trulicity 1.5, Actos ?Just small ketones noted on her UA.  We have been  increasing her insulin and re-stared her GLP but having difficulty getting blood sugars under control ? ?Advised patient that she may be developing some acidosis.  I can check a BMP stat and be in touch with her

## 2021-12-14 NOTE — Telephone Encounter (Signed)
Pt states her blood sugar is currently 303 ? ? ?Transferred to triage regarding symps  ?

## 2021-12-14 NOTE — Telephone Encounter (Signed)
Please advise 

## 2021-12-15 ENCOUNTER — Ambulatory Visit (INDEPENDENT_AMBULATORY_CARE_PROVIDER_SITE_OTHER): Payer: BC Managed Care – PPO | Admitting: Family Medicine

## 2021-12-15 ENCOUNTER — Emergency Department (HOSPITAL_BASED_OUTPATIENT_CLINIC_OR_DEPARTMENT_OTHER)
Admission: EM | Admit: 2021-12-15 | Discharge: 2021-12-15 | Disposition: A | Discharge status: Home / Self Care | Payer: BC Managed Care – PPO | Attending: Emergency Medicine | Admitting: Emergency Medicine

## 2021-12-15 ENCOUNTER — Telehealth: Payer: Self-pay | Admitting: Family Medicine

## 2021-12-15 ENCOUNTER — Encounter: Payer: Self-pay | Admitting: Family Medicine

## 2021-12-15 ENCOUNTER — Encounter (HOSPITAL_BASED_OUTPATIENT_CLINIC_OR_DEPARTMENT_OTHER): Payer: Self-pay | Admitting: Emergency Medicine

## 2021-12-15 ENCOUNTER — Other Ambulatory Visit: Payer: Self-pay

## 2021-12-15 ENCOUNTER — Emergency Department (HOSPITAL_BASED_OUTPATIENT_CLINIC_OR_DEPARTMENT_OTHER): Payer: BC Managed Care – PPO

## 2021-12-15 VITALS — BP 118/80 | HR 106 | Resp 20 | Ht 66.0 in | Wt 224.6 lb

## 2021-12-15 DIAGNOSIS — Z91040 Latex allergy status: Secondary | ICD-10-CM | POA: Insufficient documentation

## 2021-12-15 DIAGNOSIS — R824 Acetonuria: Secondary | ICD-10-CM | POA: Diagnosis not present

## 2021-12-15 DIAGNOSIS — I10 Essential (primary) hypertension: Secondary | ICD-10-CM | POA: Insufficient documentation

## 2021-12-15 DIAGNOSIS — Z85828 Personal history of other malignant neoplasm of skin: Secondary | ICD-10-CM | POA: Insufficient documentation

## 2021-12-15 DIAGNOSIS — E1165 Type 2 diabetes mellitus with hyperglycemia: Secondary | ICD-10-CM | POA: Diagnosis not present

## 2021-12-15 DIAGNOSIS — Z79899 Other long term (current) drug therapy: Secondary | ICD-10-CM | POA: Insufficient documentation

## 2021-12-15 DIAGNOSIS — Z794 Long term (current) use of insulin: Secondary | ICD-10-CM | POA: Diagnosis not present

## 2021-12-15 DIAGNOSIS — R739 Hyperglycemia, unspecified: Secondary | ICD-10-CM

## 2021-12-15 DIAGNOSIS — R5383 Other fatigue: Secondary | ICD-10-CM

## 2021-12-15 DIAGNOSIS — Z20822 Contact with and (suspected) exposure to covid-19: Secondary | ICD-10-CM | POA: Insufficient documentation

## 2021-12-15 LAB — POCT URINALYSIS DIP (MANUAL ENTRY)
Bilirubin, UA: NEGATIVE
Blood, UA: NEGATIVE
Glucose, UA: >=1000 mg/dL — AB
Leukocytes, UA: NEGATIVE
Nitrite, UA: NEGATIVE
Spec Grav, UA: 1.025 (ref 1.010–1.025)
Urobilinogen, UA: 0.2 E.U./dL
pH, UA: 6 (ref 5.0–8.0)

## 2021-12-15 LAB — I-STAT VENOUS BLOOD GAS, ED
Acid-Base Excess: 3 mmol/L — ABNORMAL HIGH (ref 0.0–2.0)
Bicarbonate: 27.2 mmol/L (ref 20.0–28.0)
Calcium, Ion: 1.19 mmol/L (ref 1.15–1.40)
HCT: 44 % (ref 36.0–46.0)
Hemoglobin: 15 g/dL (ref 12.0–15.0)
O2 Saturation: 66 %
Patient temperature: 98.7
Potassium: 3.1 mmol/L — ABNORMAL LOW (ref 3.5–5.1)
Sodium: 134 mmol/L — ABNORMAL LOW (ref 135–145)
TCO2: 28 mmol/L (ref 22–32)
pCO2, Ven: 39.4 mmHg — ABNORMAL LOW (ref 44–60)
pH, Ven: 7.448 — ABNORMAL HIGH (ref 7.25–7.43)
pO2, Ven: 33 mmHg (ref 32–45)

## 2021-12-15 LAB — HEPATIC FUNCTION PANEL
ALT: 69 U/L — ABNORMAL HIGH (ref 0–44)
AST: 99 U/L — ABNORMAL HIGH (ref 15–41)
Albumin: 3.8 g/dL (ref 3.5–5.0)
Alkaline Phosphatase: 147 U/L — ABNORMAL HIGH (ref 38–126)
Bilirubin, Direct: <0.1 mg/dL (ref 0.0–0.2)
Total Bilirubin: 0.7 mg/dL (ref 0.3–1.2)
Total Protein: 7.5 g/dL (ref 6.5–8.1)

## 2021-12-15 LAB — CBC WITH DIFFERENTIAL/PLATELET
Abs Immature Granulocytes: 0.02 10*3/uL (ref 0.00–0.07)
Basophils Absolute: 0 10*3/uL (ref 0.0–0.1)
Basophils Relative: 0 %
Eosinophils Absolute: 0.1 10*3/uL (ref 0.0–0.5)
Eosinophils Relative: 1 %
HCT: 43.9 % (ref 36.0–46.0)
Hemoglobin: 15.1 g/dL — ABNORMAL HIGH (ref 12.0–15.0)
Immature Granulocytes: 0 %
Lymphocytes Relative: 35 %
Lymphs Abs: 2.7 10*3/uL (ref 0.7–4.0)
MCH: 28.4 pg (ref 26.0–34.0)
MCHC: 34.4 g/dL (ref 30.0–36.0)
MCV: 82.5 fL (ref 80.0–100.0)
Monocytes Absolute: 0.5 10*3/uL (ref 0.1–1.0)
Monocytes Relative: 7 %
Neutro Abs: 4.3 10*3/uL (ref 1.7–7.7)
Neutrophils Relative %: 57 %
Platelets: 323 10*3/uL (ref 150–400)
RBC: 5.32 MIL/uL — ABNORMAL HIGH (ref 3.87–5.11)
RDW: 13.6 % (ref 11.5–15.5)
WBC: 7.6 10*3/uL (ref 4.0–10.5)
nRBC: 0 % (ref 0.0–0.2)

## 2021-12-15 LAB — BASIC METABOLIC PANEL
Anion gap: 14 (ref 5–15)
BUN: 11 mg/dL (ref 6–20)
CO2: 23 mmol/L (ref 22–32)
Calcium: 9.2 mg/dL (ref 8.9–10.3)
Chloride: 95 mmol/L — ABNORMAL LOW (ref 98–111)
Creatinine, Ser: 0.95 mg/dL (ref 0.44–1.00)
GFR, Estimated: >60 mL/min (ref >60)
Glucose, Bld: 368 mg/dL — ABNORMAL HIGH (ref 70–99)
Potassium: 3.1 mmol/L — ABNORMAL LOW (ref 3.5–5.1)
Sodium: 132 mmol/L — ABNORMAL LOW (ref 135–145)

## 2021-12-15 LAB — URINALYSIS, MICROSCOPIC (REFLEX)

## 2021-12-15 LAB — URINALYSIS, ROUTINE W REFLEX MICROSCOPIC
Bilirubin Urine: NEGATIVE
Glucose, UA: >=500 mg/dL — AB
Hgb urine dipstick: NEGATIVE
Ketones, ur: 15 mg/dL — AB
Leukocytes,Ua: NEGATIVE
Nitrite: NEGATIVE
Protein, ur: NEGATIVE mg/dL
Specific Gravity, Urine: 1.02 (ref 1.005–1.030)
pH: 6 (ref 5.0–8.0)

## 2021-12-15 LAB — CBC
HCT: 43.8 % (ref 36.0–46.0)
Hemoglobin: 15.1 g/dL — ABNORMAL HIGH (ref 12.0–15.0)
MCH: 28.4 pg (ref 26.0–34.0)
MCHC: 34.5 g/dL (ref 30.0–36.0)
MCV: 82.5 fL (ref 80.0–100.0)
Platelets: 315 10*3/uL (ref 150–400)
RBC: 5.31 MIL/uL — ABNORMAL HIGH (ref 3.87–5.11)
RDW: 13.4 % (ref 11.5–15.5)
WBC: 7.5 10*3/uL (ref 4.0–10.5)
nRBC: 0 % (ref 0.0–0.2)

## 2021-12-15 LAB — CBG MONITORING, ED
Glucose-Capillary: 391 mg/dL — ABNORMAL HIGH (ref 70–99)
Glucose-Capillary: 282 mg/dL — ABNORMAL HIGH (ref 70–99)

## 2021-12-15 LAB — RESP PANEL BY RT-PCR (FLU A&B, COVID) ARPGX2
Influenza A by PCR: NEGATIVE
Influenza B by PCR: NEGATIVE
SARS Coronavirus 2 by RT PCR: NEGATIVE

## 2021-12-15 LAB — GLUCOSE, POCT (MANUAL RESULT ENTRY): POC Glucose: 364 mg/dl — AB (ref 70–99)

## 2021-12-15 LAB — BETA-HYDROXYBUTYRIC ACID: Beta-Hydroxybutyric Acid: 0.53 mmol/L — ABNORMAL HIGH (ref 0.05–0.27)

## 2021-12-15 MED ORDER — LACTATED RINGERS IV BOLUS
1000.0000 mL | Freq: Once | INTRAVENOUS | Status: AC
  Administered 2021-12-15: 1000 mL via INTRAVENOUS

## 2021-12-15 MED ORDER — POTASSIUM CHLORIDE CRYS ER 20 MEQ PO TBCR
40.0000 meq | EXTENDED_RELEASE_TABLET | Freq: Once | ORAL | Status: AC
  Administered 2021-12-15: 40 meq via ORAL
  Filled 2021-12-15: qty 2

## 2021-12-15 NOTE — Telephone Encounter (Signed)
Pt states that she was sent to the ED by [facility] ? ?Pt scheduled ER follow 3/23 @ 120 via my chart, pt would like to know if she can stay out of work until follow up  ? ? ?Please advise  ?

## 2021-12-15 NOTE — ED Provider Notes (Signed)
?[facility] ?Provider Note ? ? ?CSN: [id number] ?Arrival date & time: 12/15/21  1218 ? ?  ? ?History ? ?Chief Complaint  ?Patient presents with  ? Hyperglycemia  ? ? ?Natalie Richard is a 56 y.o. female. ? ?HPI ? ?  ? ?56 year old female with a history of hypertension, hyperlipidemia, diabetes, squamous cell skin cancer who presents with concern for hyperglycemia. ? ? ?She is seen her primary care physician today-she had run out of her Trulicity other prior visit, she was given samples of the 1.5 mg and then they had gradually been increasing her insulin dose.  She called them yesterday reporting that her blood sugar was 300 and she increased her insulin from 36 to 40 units for follow-up today.  Her most recent Trulicity dose was on Sunday with 1.5.  She had small ketones noted on her urine, and they gave the option of checking a BMP, versus going to the emergency department, and she chose to come to the ED because of not feeling well.   ? ?Reports she has had fatigue, not feeling herself, present for months but worsened over the last week.  Found that hgb A1c was elevated with PCP.  Feels thirsty.  Feels like she wants to eat but then feels nausea and has no appetite for it . Just feels "yucky". For a while wasn't sure why she was feeling this way then found out her hgb A1c was over 12 and thinks it is related to her DM.  Reports such severe fatigue that it is difficult to go out. Will go places and feel too tired and want to go home.  SOB at times with exertion.  Lightheaded when standing up. Has had some overall blurriness in vision, no diplopia or visual field changes.  ?No chest pain, resting dyspnea, vomiting, diarrhea, black or bloody stools, fever, cough. Denies numbness, focal weakness, difficulty talking or walking or facial droop.   ? ? ? ?Past Medical History:  ?Diagnosis Date  ? Anxiety   ? Cancer [facility])   ? skin- Right shin squamous cell  ? Depression   ? Diabetes  mellitus   ? Diabetes mellitus, type II ([place])   ? GERD (gastroesophageal reflux disease)   ? Hematoma   ? on buttock  ? Hyperlipidemia   ? Hypertension   ? Migraine   ? Seasonal allergies   ?  ?Home Medications ?Prior to Admission medications   ?Medication Sig Start Date End Date Taking? Authorizing Provider  ?brexpiprazole (REXULTI) 1 MG TABS tablet Take 1 tablet (1 mg total) by mouth daily. 10/25/21   Mozingo, Berdie Ogren, NP  ?citalopram (CELEXA) 40 MG tablet Take 1 tablet (40 mg total) by mouth daily. 04/07/21   Mozingo, Berdie Ogren, NP  ?clonazePAM (KLONOPIN) 0.5 MG tablet Take 1 tablet (0.5 mg total) by mouth 2 (two) times daily. 10/25/21   Mozingo, Berdie Ogren, NP  ?Dulaglutide (TRULICITY) 3 IW/5.8KD SOPN Inject 3 mg as directed once a week. ?Patient not taking: Reported on 12/06/2021 11/09/21   Copland, Gay Filler, MD  ?glucose blood (ONETOUCH VERIO) test strip Use as instructed- may check blood sugar up to twice a day. ?Patient not taking: Reported on 12/06/2021 07/02/20   Copland, Gay Filler, MD  ?glucose blood test strip Use to check glucose 1-2x daily ?Patient not taking: Reported on 12/06/2021 06/14/17   Copland, Gay Filler, MD  ?Insulin Glargine (BASAGLAR KWIKPEN) 100 UNIT/ML START WITH 10 UNITS DAILY, INCREASE AS DIRECTED BY MD.  ESTIMATE FINAL DOSE WILL BE 25 UNITS DAILY 04/01/21   Mosie Lukes, MD  ?Insulin Pen Needle (BD PEN NEEDLE NANO 2ND GEN) 32G X 4 MM MISC USE AS DIRECTED 05/10/21   Copland, Gay Filler, MD  ?lisinopril-hydrochlorothiazide (ZESTORETIC) 10-12.5 MG tablet TAKE 1/2 TABLET ONCE A DAY MAY INCREASE TO 1 TABLET DAILY IF DIRECTED BY MD 04/20/21   Copland, Gay Filler, MD  ?Loratadine 10 MG CAPS Take by mouth.    [provider]  ?Multiple Vitamins-Minerals (MULTIVITAMIN WITH MINERALS) tablet Take 1 tablet by mouth daily.    [provider]  ?ondansetron (ZOFRAN) 4 MG tablet 1 tab po tid prn nauseau ?Patient not taking: Reported on 12/06/2021 11/16/21   Saguier, Percell Miller, PA-C   ?pantoprazole (PROTONIX) 40 MG tablet Take 1 tablet (40 mg total) by mouth daily. 02/23/21   Copland, Gay Filler, MD  ?pioglitazone (ACTOS) 30 MG tablet TAKE 1 TABLET BY MOUTH EVERY DAY 10/04/21   Copland, Gay Filler, MD  ?rosuvastatin (CRESTOR) 40 MG tablet TAKE 1 TABLET BY MOUTH EVERY DAY 11/08/21   Copland, Gay Filler, MD  ?   ? ?Allergies    ?Buspar [buspirone], Augmentin [amoxicillin-pot clavulanate], Biaxin [clarithromycin], Ceftin [cefuroxime], Ciprofloxacin, Dapagliflozin, Doxycycline, Levaquin [levofloxacin], Metformin and related, Phenazopyridine hcl, Pyridium [phenazopyridine hcl], Sulfa antibiotics, Latex, and Lorabid [loracarbef]   ? ?Review of Systems   ?Review of Systems ?See above ?Physical Exam ?Updated Vital Signs ?BP 107/71 (BP Location: Right Arm)   Pulse 71   Temp 98.2 ?F (36.8 ?C) (Oral)   Resp 19   Ht 5' 6"  (1.676 m)   Wt 101.6 kg   SpO2 91%   BMI 36.15 kg/m?  ?Physical Exam ?Vitals and nursing note reviewed.  ?Constitutional:   ?   General: She is not in acute distress. ?   Appearance: She is well-developed. She is not diaphoretic.  ?HENT:  ?   Head: Normocephalic and atraumatic.  ?Eyes:  ?   General: No visual field deficit. ?   Conjunctiva/sclera: Conjunctivae normal.  ?Cardiovascular:  ?   Rate and Rhythm: Normal rate and regular rhythm.  ?   Heart sounds: Normal heart sounds. No murmur heard. ?  No friction rub. No gallop.  ?Pulmonary:  ?   Effort: Pulmonary effort is normal. No respiratory distress.  ?   Breath sounds: Normal breath sounds. No wheezing or rales.  ?Abdominal:  ?   General: There is no distension.  ?   Palpations: Abdomen is soft.  ?   Tenderness: There is no abdominal tenderness. There is no guarding.  ?Musculoskeletal:     ?   General: No tenderness.  ?   Cervical back: Normal range of motion.  ?Skin: ?   General: Skin is warm and dry.  ?   Findings: No erythema or rash.  ?Neurological:  ?   Mental Status: She is alert and oriented to person, place, and time.  ?    Cranial Nerves: No cranial nerve deficit, dysarthria or facial asymmetry.  ?   Sensory: No sensory deficit.  ?   Motor: No weakness.  ? ? ?ED Results / Procedures / Treatments   ?Labs ?(all labs ordered are listed, but only abnormal results are displayed) ?Labs Reviewed  ?BASIC METABOLIC PANEL - Abnormal; Notable for the following components:  ?    Result Value  ? Sodium 132 (*)   ? Potassium 3.1 (*)   ? Chloride 95 (*)   ? Glucose, Bld 368 (*)   ?  All other components within normal limits  ?CBC - Abnormal; Notable for the following components:  ? RBC 5.31 (*)   ? Hemoglobin 15.1 (*)   ? All other components within normal limits  ?URINALYSIS, ROUTINE W REFLEX MICROSCOPIC - Abnormal; Notable for the following components:  ? Glucose, UA >=500 (*)   ? Ketones, ur 15 (*)   ? All other components within normal limits  ?BETA-HYDROXYBUTYRIC ACID - Abnormal; Notable for the following components:  ? Beta-Hydroxybutyric Acid 0.53 (*)   ? All other components within normal limits  ?HEPATIC FUNCTION PANEL - Abnormal; Notable for the following components:  ? AST 99 (*)   ? ALT 69 (*)   ? Alkaline Phosphatase 147 (*)   ? All other components within normal limits  ?CBC WITH DIFFERENTIAL/PLATELET - Abnormal; Notable for the following components:  ? RBC 5.32 (*)   ? Hemoglobin 15.1 (*)   ? All other components within normal limits  ?URINALYSIS, MICROSCOPIC (REFLEX) - Abnormal; Notable for the following components:  ? Bacteria, UA RARE (*)   ? All other components within normal limits  ?CBG MONITORING, ED - Abnormal; Notable for the following components:  ? Glucose-Capillary 391 (*)   ? All other components within normal limits  ?CBG MONITORING, ED - Abnormal; Notable for the following components:  ? Glucose-Capillary 282 (*)   ? All other components within normal limits  ?I-STAT VENOUS BLOOD GAS, ED - Abnormal; Notable for the following components:  ? pH, Ven 7.448 (*)   ? pCO2, Ven 39.4 (*)   ? Acid-Base Excess 3.0 (*)   ? Sodium  134 (*)   ? Potassium 3.1 (*)   ? All other components within normal limits  ?RESP PANEL BY RT-PCR (FLU A&B, COVID) ARPGX2  ?CBG MONITORING, ED  ? ? ?EKG ?EKG Interpretation ? ?Date/Time:  Wednesday December 15 2021 13:13:25 EDT

## 2021-12-15 NOTE — ED Triage Notes (Signed)
Reports her blood sugar has been elevated for months causing her to feel dizzy.  Seen at PCP today.  Sent to ED for workup. ?

## 2021-12-15 NOTE — ED Notes (Signed)
Completed ordered VBG and delivered results to MD Schlossman at approximately 1325. ?

## 2021-12-16 NOTE — Telephone Encounter (Signed)
I called patient and spoke with her.  She notes she is feeling improved ?Sugar was 262 this morning ?She is taking her insulin in the early afternoon, 40 units daily.  I asked her to increase to 44 units today ? ?She will take Trulicity 1.5 this week, then increase to 3 mg next week ? ?Okay to be out of work until our visit next week, will provide note ?

## 2021-12-16 NOTE — Telephone Encounter (Signed)
Okay to remain out of work? ?

## 2021-12-17 ENCOUNTER — Other Ambulatory Visit: Payer: Self-pay | Admitting: Family Medicine

## 2021-12-17 DIAGNOSIS — E1165 Type 2 diabetes mellitus with hyperglycemia: Secondary | ICD-10-CM

## 2021-12-17 MED ORDER — HUMALOG KWIKPEN 200 UNIT/ML ~~LOC~~ SOPN: PEN_INJECTOR | SUBCUTANEOUS | 3 refills | Status: DC

## 2021-12-17 NOTE — Addendum Note (Signed)
Addended by: Lamar Blinks C on: 12/17/2021 08:17 PM ? ? Modules accepted: Orders ? ?

## 2021-12-17 NOTE — Telephone Encounter (Signed)
See below

## 2021-12-17 NOTE — Telephone Encounter (Signed)
Called pt- her glucose was 350 this am and she is feeling really thirsty-not as bad as she did earlier this week, but she is worried about heading in that direction ?She has not checked her glucose since this am ?She is on 44 units of her long acting insulin- basaglar currently ?Also using Trulicity and Actos ? ?I had her check her glucose, 418 ?She does not have any rapid acting insulin on hand ? ?She is seeing Dr. Denton Lank endocrinologist with [facility] but not until next month  ? ?Wt Readings from Last 3 Encounters:  ?12/15/21 224 lb (101.6 kg)  ?12/15/21 224 lb 9.6 oz (101.9 kg)  ?12/06/21 225 lb (102.1 kg)  ? ?We decided to add 3 times daily rapid acting insulin to her long-acting insulin by the following sliding scale: ?Humalog/insulin lispro ?Glucose over 250- 3 units ?Glucose over 300- 5 units ?Glucose over 350- 7 units ?400 or higher 9 units ? ?This is her first time with rapid acting insulin so we will start with conservative dosage as above ?I will call the patient tomorrow and check on how she is doing ? ?I am somewhat concerned about her obtaining rapid acting insulin over the weekend, oftentimes there are issues with insurance or other roadblocks.  Patient notes the ER is available if her glucose continues to go up or if she is not doing okay ?

## 2021-12-17 NOTE — Telephone Encounter (Signed)
Pt states blood sugar this morning was 350 ?Transferred to triage regarding symps  ?

## 2021-12-18 ENCOUNTER — Other Ambulatory Visit: Payer: Self-pay | Admitting: Family Medicine

## 2021-12-18 ENCOUNTER — Telehealth: Payer: Self-pay | Admitting: Family Medicine

## 2021-12-18 MED ORDER — NOVOLOG FLEXPEN 100 UNIT/ML ~~LOC~~ SOPN: PEN_INJECTOR | SUBCUTANEOUS | 11 refills | Status: DC

## 2021-12-18 NOTE — Progress Notes (Signed)
Substituted novlog for humalog as humalog not covered.  Confirmed with pharmacy that they will pay for novolog- they will let pt know about this substitution  ?

## 2021-12-18 NOTE — Telephone Encounter (Signed)
Called pt- glucose 285 this am.  She did not pick up the rapid acting insulin as of yet ?Asked her to increase basaglar 2 more to 46 units- she will do so.  May not need rapid acting if she continues to go in the right direction  ?

## 2021-12-20 ENCOUNTER — Telehealth: Payer: Self-pay | Admitting: Family Medicine

## 2021-12-20 NOTE — Telephone Encounter (Signed)
I called and checked in with patient on 3/18 and 3/19- she reported glucose under 300 and feeling better.  She did not end up starting the short acting insulin as not needed.  Perhaps her trulicity is taking effect for her.  She will increase trulicity to 3 mg again this weekend and will decrease her basaglar if needed  ?

## 2021-12-22 NOTE — Progress Notes (Addendum)
Therapist, music at [facility] ?[address], [address] ?[place], [place] [address] ?336 [phone] ?Fax [phone] ? ?Date:  12/23/2021  ? ?Name:  Natalie Richard   DOB:  1965-12-14   MRN:  [id number] ? ?PCP:  Darreld Mclean, MD  ? ? ?Chief Complaint: ER follow up (12/15/21: Hyperglycemia/Concerns/ questions: pt is concerned about her BP today. She says she does feel a little lightheaded. [place] exam: none recent) ? ? ?History of Present Illness: ? ?Natalie Richard is a 56 y.o. very pleasant female patient who presents with the following: ? ?Patient seen today for follow-up. ?See most recent office visit note from 3/15 and then subsequent telephone notes. ?Patient had run out of her Trulicity for several weeks, blood sugars went quite high.  We have been titrating her long-acting insulin. ?We had planned to start sliding scale insulin with meals, but glucose did finally start to improve without taking this step ? ?Her sugar was 265 this am ?She notes she is feeing a bit lightheaded still but improved ?She is still thirsty- not as bad but more than normal ?She is on 46 units of basaglar  ?She will do 3 mg of trulicity on Sunday  ? ?She had a new job at a [employer] which she is excited about ? ?Lab Results  ?Component Value Date  ? HGBA1C 12.5 (H) 12/06/2021  ? ?BP Readings from Last 3 Encounters:  ?12/23/21 110/60  ?12/15/21 107/71  ?12/15/21 118/80  ? ? ? ?Patient Active Problem List  ? Diagnosis Date Noted  ? History of squamous cell carcinoma in situ (SCCIS) of skin 12/11/2019  ? NASH (nonalcoholic steatohepatitis) 07/26/2015  ? BMI 39.0-39.9,adult 03/19/2012  ? Hematoma 03/19/2012  ? HTN (hypertension), benign 01/09/2012  ? Osteopenia 01/09/2012  ? Migraines 01/09/2012  ? Insomnia 01/09/2012  ? Anxiety and depression 01/09/2012  ? Hyperlipidemia 01/09/2012  ? Diabetes mellitus 01/09/2012  ? Allergic rhinitis 01/09/2012  ? ? ?Past Medical History:  ?Diagnosis Date  ? Anxiety   ?  Cancer [facility])   ? skin- Right shin squamous cell  ? Depression   ? Diabetes mellitus   ? Diabetes mellitus, type II ([place])   ? GERD (gastroesophageal reflux disease)   ? Hematoma   ? on buttock  ? Hyperlipidemia   ? Hypertension   ? Migraine   ? Seasonal allergies   ? ? ?Past Surgical History:  ?Procedure Laterality Date  ? ABDOMINAL HYSTERECTOMY  2004  ? PCOS  ? EYE SURGERY    ? INNER EAR SURGERY Left 08/2016  ? ? ?Social History  ? ?Tobacco Use  ? Smoking status: Never  ? Smokeless tobacco: Never  ?Vaping Use  ? Vaping Use: Never used  ?Substance Use Topics  ? Alcohol use: Not Currently  ?  Alcohol/week: 8.0 - 10.0 standard drinks  ?  Types: 6 Cans of beer, 2 Shots of liquor per week  ? Drug use: No  ? ? ?Family History  ?Problem Relation Age of Onset  ? Cancer Mother 34  ?     colon  ? Diabetes Mother   ? Rheum arthritis Mother   ? Dementia Mother   ? Depression Mother   ? Cancer Father 55  ?     bone and prostate  ? Cancer Maternal Grandmother   ?     breast  ? Diabetes Maternal Grandfather   ? Depression Cousin   ? ? ?Allergies  ?Allergen Reactions  ?  Buspar [Buspirone] Other (See Comments)  ?  Lip swelling and rash numbness ?  ? Augmentin [Amoxicillin-Pot Clavulanate] Nausea And Vomiting  ? Biaxin [Clarithromycin]   ? Ceftin [Cefuroxime] Swelling  ?  Eye swelling, shortness of breath ?  ? Ciprofloxacin   ?  Developed itching with IV form in the hospital, but has since taken PO without a problem  ? Dapagliflozin Other (See Comments)  ?  Burning upon urination. Increased blood sugar.  ? Doxycycline Itching  ? Levaquin [Levofloxacin] Hives  ? Metformin And Related Nausea And Vomiting  ? Phenazopyridine Hcl Other (See Comments)  ?  Does not remember reaction  ? Pyridium [Phenazopyridine Hcl]   ?  Does not remember reaction  ? Sulfa Antibiotics   ?  As a younger person had itching, but has taken more recently and did ok  ? Latex Rash  ? Lorabid [Loracarbef] Rash  ? ? ?Medication list has been reviewed and  updated. ? ?Current Outpatient Medications on File Prior to Visit  ?Medication Sig Dispense Refill  ? Dulaglutide (TRULICITY) 3 ZO/1.0RU SOPN Inject 3 mg as directed once a week. 6 mL 3  ? glucose blood (ONETOUCH VERIO) test strip Use as instructed- may check blood sugar up to twice a day. 100 each 12  ? glucose blood test strip Use to check glucose 1-2x daily 100 each 12  ? Insulin Glargine (BASAGLAR KWIKPEN) 100 UNIT/ML START WITH 10 UNITS DAILY, INCREASE AS DIRECTED BY MD. ESTIMATE FINAL DOSE WILL BE 25 UNITS DAILY 15 mL 5  ? Insulin Pen Needle (BD PEN NEEDLE NANO 2ND GEN) 32G X 4 MM MISC USE AS DIRECTED 100 each 3  ? lisinopril-hydrochlorothiazide (ZESTORETIC) 10-12.5 MG tablet TAKE 1/2 TABLET ONCE A DAY MAY INCREASE TO 1 TABLET DAILY IF DIRECTED BY MD 90 tablet 1  ? Loratadine 10 MG CAPS Take by mouth.    ? Multiple Vitamins-Minerals (MULTIVITAMIN WITH MINERALS) tablet Take 1 tablet by mouth daily.    ? pantoprazole (PROTONIX) 40 MG tablet Take 1 tablet (40 mg total) by mouth daily. 90 tablet 3  ? pioglitazone (ACTOS) 30 MG tablet TAKE 1 TABLET BY MOUTH EVERY DAY 90 tablet 1  ? rosuvastatin (CRESTOR) 40 MG tablet TAKE 1 TABLET BY MOUTH EVERY DAY 90 tablet 0  ? brexpiprazole (REXULTI) 1 MG TABS tablet Take 1 tablet (1 mg total) by mouth daily. 30 tablet 5  ? citalopram (CELEXA) 40 MG tablet Take 1 tablet (40 mg total) by mouth daily. 90 tablet 3  ? clonazePAM (KLONOPIN) 0.5 MG tablet Take 1 tablet (0.5 mg total) by mouth 2 (two) times daily. 60 tablet 2  ? ?No current facility-administered medications on file prior to visit.  ? ? ?Review of Systems: ? ?As per HPI- otherwise negative. ? ? ?Physical Examination: ?Vitals:  ? 12/23/21 1320  ?BP: 110/60  ?Pulse: 63  ?Resp: 18  ?Temp: 97.7 ?F (36.5 ?C)  ?SpO2: 96%  ? ?Vitals:  ? 12/23/21 1320  ?Weight: 228 lb 3.2 oz (103.5 kg)  ?Height: 5' 6"  (1.676 m)  ? ?Body mass index is 36.83 kg/m?. ?Ideal Body Weight: Weight in (lb) to have BMI = 25: 154.6 ? ?GEN: no acute  distress.  Obese, looks well ?HEENT: Atraumatic, Normocephalic.  ?Ears and Nose: No external deformity. ?CV: RRR, No M/G/R. No JVD. No thrill. No extra heart sounds. ?PULM: CTA B, no wheezes, crackles, rhonchi. No retractions. No resp. distress. No accessory muscle use. ?ABD: S, NT, ND ?EXTR: No c/c/e ?PSYCH:  Normally interactive. Conversant.  ? ? ?Assessment and Plan: ?Type 2 diabetes mellitus with hyperglycemia, without long-term current use of insulin ([place]) - Plan: Comprehensive metabolic panel, Insulin Glargine (BASAGLAR KWIKPEN) 100 UNIT/ML ? ?Patient seen today for follow-up of diabetes.  She recently ran out of her Trulicity due to medication shortage and was not able to take it for about a month.  She had been on 3 mg.  We think this triggered a sharp increase in her glucose, with symptoms of polyuria, polydipsia, fatigue and lightheadedness.  We have been working on increasing her [place] and her sugars are improved.  She is now running in the 200s ? ?We were able to restart her Trulicity- she is taking 1.5 and will increase to 3 mg this weekend.  She will be in contact with me for recommendations about insulin adjustment as we expect her sugars will decrease when she increases her Trulicity ? ?Check BMP today ? ?Signed ?Lamar Blinks, MD ? ?Received her labs 3/24- critically high glucose yesterday ?Anion gap is 11- ok ?Called pt - no answer, will try again later on  ? ?Called her back and reached her- she is ok, glucose under 300 again today ?She is going up to trulicity 3 mg this weekend ?She increased her basaglar as discussed.  Will continue to followup with her closely  ?Suspect transaminitis is related to high glucose and will improve, continue to follow ?Results for orders placed or performed in visit on 12/23/21  ?Comprehensive metabolic panel  ?Result Value Ref Range  ? Sodium 133 (L) 135 - 145 mEq/L  ? Potassium 4.0 3.5 - 5.1 mEq/L  ? Chloride 96 96 - 112 mEq/L  ? CO2 26 19 - 32 mEq/L  ?  Glucose, Bld 509 (HH) 70 - 99 mg/dL  ? BUN 9 6 - 23 mg/dL  ? Creatinine, Ser 0.99 0.40 - 1.20 mg/dL  ? Total Bilirubin 0.5 0.2 - 1.2 mg/dL  ? Alkaline Phosphatase 135 (H) 39 - 117 U/L  ? AST 152 (H) 0 - 37 U/L  ? ALT 99 (H) 0

## 2021-12-23 ENCOUNTER — Ambulatory Visit (INDEPENDENT_AMBULATORY_CARE_PROVIDER_SITE_OTHER): Payer: BC Managed Care – PPO | Admitting: Family Medicine

## 2021-12-23 VITALS — BP 110/60 | HR 63 | Temp 97.7°F | Resp 18 | Ht 66.0 in | Wt 228.2 lb

## 2021-12-23 DIAGNOSIS — E1165 Type 2 diabetes mellitus with hyperglycemia: Secondary | ICD-10-CM | POA: Diagnosis not present

## 2021-12-23 MED ORDER — BASAGLAR KWIKPEN 100 UNIT/ML ~~LOC~~ SOPN: PEN_INJECTOR | SUBCUTANEOUS | 5 refills | Status: DC

## 2021-12-23 NOTE — Patient Instructions (Addendum)
It was good to see you again today- I am glad you are doing better ?Ok to increase basaglar to 48 units, and can go up to 50 if am glucose still over 200 in 2 days ?When you go up to Trulicity 3 mg your glucose may drop- I can help you adjust your insulin as needed.  Please contact me!  ?For the time being stop taking lisinopril/hctz - we might go back on it later  ?

## 2021-12-24 ENCOUNTER — Telehealth: Payer: Self-pay

## 2021-12-24 LAB — COMPREHENSIVE METABOLIC PANEL
ALT: 99 U/L — ABNORMAL HIGH (ref 0–35)
AST: 152 U/L — ABNORMAL HIGH (ref 0–37)
Albumin: 4.1 g/dL (ref 3.5–5.2)
Alkaline Phosphatase: 135 U/L — ABNORMAL HIGH (ref 39–117)
BUN: 9 mg/dL (ref 6–23)
CO2: 26 mEq/L (ref 19–32)
Calcium: 8.9 mg/dL (ref 8.4–10.5)
Chloride: 96 mEq/L (ref 96–112)
Creatinine, Ser: 0.99 mg/dL (ref 0.40–1.20)
GFR: 63.31 mL/min (ref >60.00)
Glucose, Bld: 509 mg/dL (ref 70–99)
Potassium: 4 mEq/L (ref 3.5–5.1)
Sodium: 133 mEq/L — ABNORMAL LOW (ref 135–145)
Total Bilirubin: 0.5 mg/dL (ref 0.2–1.2)
Total Protein: 6.5 g/dL (ref 6.0–8.3)

## 2021-12-24 NOTE — Telephone Encounter (Signed)
CRITICAL VALUE STICKER ? ?CRITICAL VALUE: Glucose 509 ? ?RECEIVER (on-site recipient of call): Verlinda Slotnick, RMA ? ?DATE & TIME NOTIFIED: 12/24/2021 at 11:20 am ? ?MESSENGER (representative from lab): Saa Bayon ? ?MD NOTIFIED: Lamar Blinks, MD and Riki Sheer, DO (DOD) ? ?TIME OF NOTIFICATION: 11:25 am ? ?RESPONSE:   ?

## 2021-12-25 ENCOUNTER — Telehealth: Payer: Self-pay | Admitting: Family Medicine

## 2021-12-25 NOTE — Telephone Encounter (Signed)
Patient called answering service. Blood sugar was 390; checked right before eating around 4pm. She continues to feel the same as she has in the last 2 weeks - some headache, fatigue. She had run out of trulicity and was not regularly checking sugars. Now back on trulicity (just increased dose today)and taking basaglar 48 units and actos regularly. She does have consult and upcoming appointment with endo.  ? ?Since patient had been speaking regularly with her PCP, I did contact pcp to discuss.  ? ?Advised patient to make sure to stay hydrated. Patient had salad for dinner and is working on eating low carb; feels she has good understanding of what this entails. We discussed that if feeling worse,she can proceed to ER for evaluation. Patient does not have short acting insulin at home. If morning sugar over 300, suggested increasing basaglar to 50 units. Due to recent trulicity increase, pcp did not want to overshoot treatment as this takes effect. Discussed that activity can help decrease sugars as well. Suggested consideration for continuous glucose monitor as I think seeing trends is helpful for control with sugars and lifestyle modifications.  ? ?Advised patient that her PCP stated she will be checking back in with her. Patient felt comfortable monitoring for now.  ?

## 2021-12-27 ENCOUNTER — Telehealth: Payer: Self-pay

## 2021-12-27 NOTE — Telephone Encounter (Signed)
FYI: is this okay ?

## 2021-12-27 NOTE — Telephone Encounter (Signed)
Pt called wanting to let Dr. Lorelei Pont know Endocrinology cannot see her until 02/21/22. ?

## 2021-12-27 NOTE — Telephone Encounter (Signed)
Dr Lorelei Pont discussed with pt on Friday 12/24/21 ?

## 2021-12-27 NOTE — Telephone Encounter (Signed)
Nurse Assessment ?Nurse: Glean Salvo, RN, [place] Date/Time Eilene Ghazi Time): 12/25/2021 6:22:04 PM ?Confirm and document reason for call. If ?symptomatic, describe symptoms. ?---Caller states her blood sugar right now is 390. Takes ?Actos, Trulicity , Scientist, physiological. Has not taken Oral pill ?takes tonight. Headaches and dizziness. ?Does the patient have any new or worsening ?symptoms? ---Yes ?Will a triage be completed? ---Yes ?Related visit to physician within the last 2 weeks? ---No ?Does the PT have any chronic conditions? (i.e. ?diabetes, asthma, this includes High risk factors for ?pregnancy, etc.) ?---Yes ?List chronic conditions. ---Diabetes ?Is this a behavioral health or substance abuse call? ---No ?Guidelines ?Guideline Title Affirmed Question Affirmed Notes Nurse Date/Time (Eastern ?Time) ?Diabetes - High ?Blood Sugar ?[1] Blood glucose ?240 - 300 mg/dL ?(13.3 - 16.7 mmol/ ?L) AND [2] uses ?insulin (e.g., insulindependent, all people ?with type 1 diabetes) ?Walker-Foster, Therapist, sports, ?[place] ?12/25/2021 6:24:11 ?PM ?Disp. Time (Eastern ?Time) Disposition Final User ?12/25/2021 6:02:38 PM Attempt made - message left Walker-Foster, RN, [place] ?PLEASE NOTE: All timestamps contained within this report are represented as [place] Standard Time. ?CONFIDENTIALTY NOTICE: This fax transmission is intended only for the addressee. It contains information that is legally privileged, confidential or ?otherwise protected from use or disclosure. If you are not the intended recipient, you are strictly prohibited from reviewing, disclosing, copying using ?or disseminating any of this information or taking any action in reliance on or regarding this information. If you have received this fax in error, please ?notify us immediately by telephone so that we can arrange for its return to [place]. Phone: [phone], Toll-Free: [phone], Fax: [phone] ?Page: 2 of 2 ?Call Id: [id number] ?Disp. Time (Eastern ?Time) Disposition Final User ?12/25/2021  6:33:40 PM Called On-Call Provider Glean Salvo, RN, E[place] ?12/25/2021 6:35:33 PM Home Care Yes Walker-Foster, RN, Ebone ?Caller Disagree/Comply Comply ?Caller Understands Yes ?PreDisposition Call Doctor ?Care Advice Given Per Guideline ?HOME CARE: * You should be able to treat this at home. CALL BACK IF: * Blood glucose over 300 mg/dL (16.7 mmol/L), two or ?more times in a row. * You become worse CARE ADVICE given per Diabetes - High Blood Sugar (Adult) guideline. ?Comments ?User: Ellison Carwin, RN Date/Time Eilene Ghazi Time): 12/25/2021 6:23:42 PM ?ED last week ?User: Ellison Carwin, RN Date/Time Eilene Ghazi Time): 12/25/2021 6:25:49 PM ?BS 272 this morning ?User: Ellison Carwin, RN Date/Time Eilene Ghazi Time): 12/25/2021 6:28:41 PM ?[place] 48 Units, Trulicity 3 mg . Oral tonight 30 at night ?Paging ?DoctorName Phone DateTime Result/ ?Outcome Message Type Notes ?Mannie Stabile [phone] ?12/25/2021 ?6:33:40 ?PM ?Called On ?Call Provider - ?Reached ?Doctor Paged ?Mannie Stabile ?12/25/2021 ?6:34:13 ?PM ?Spoke with On ?Call - General Message Result PCP asked to be connected with Pt ?

## 2021-12-27 NOTE — Telephone Encounter (Signed)
This appointment was set with Kirk Ruths, MD [facility]) [phone]  ?

## 2021-12-28 NOTE — Telephone Encounter (Signed)
Left vm to return call.  ? ?

## 2021-12-28 NOTE — Telephone Encounter (Signed)
Pt has been feeling better, recent blood sugars have been in the 200's. Appointment for next Monday.  ?

## 2021-12-28 NOTE — Telephone Encounter (Signed)
Pt called today to follow up on this to see if Dr. Lorelei Pont had a chance to call Endocrinology about getting pt's appointment moved up.  Pt stated her appointment is with Dr. Denton Lank at [facility]. ?

## 2021-12-28 NOTE — Telephone Encounter (Signed)
Checking on follow up for Endo ?

## 2021-12-29 ENCOUNTER — Telehealth: Payer: Self-pay

## 2021-12-29 DIAGNOSIS — E119 Type 2 diabetes mellitus without complications: Secondary | ICD-10-CM

## 2021-12-29 NOTE — Telephone Encounter (Signed)
Pt needs referral placed to see Dr Denton Lank in [place]. This is Endo's satellite site, the [facility] location is booked out further.  ? ?She has been placed on cancellation list. ?

## 2021-12-29 NOTE — Addendum Note (Signed)
Addended by: Lamar Blinks C on: 12/29/2021 04:14 PM ? ? Modules accepted: Orders ? ?

## 2021-12-29 NOTE — Progress Notes (Addendum)
Therapist, music at [facility] ?[address], [address] ?[place], [place] [address] ?336 [phone] ?Fax [phone] ? ?Date:  01/03/2022  ? ?Name:  Natalie Richard   DOB:  08-12-1966   MRN:  [id number] ? ?PCP:  Darreld Mclean, MD  ? ? ?Chief Complaint: Diabetes (Elevated blood sugars. Referral placed to [facility], Dr Forde Dandy has also called the pt back. ) ? ? ?History of Present Illness: ? ?Natalie Richard is a 56 y.o. very pleasant female patient who presents with the following: ? ?Patient following up today to discuss recent worsening of diabetes ?Patient ran out of her Trulicity for about a month due to supply chain issues.  This caused significant worsening of her diabetes control.  She was seen in the ER on 3/15 for hyperglycemia, was hydrated and released to home. ? ?She estimates she has had diabetes for close to 20 years now  ? ?We have started her back on Trulicity and have been steadily increasing her long-acting insulin ?She has been on trulicity 3 mg for 2 weeks now ?Taking 50 units of basaglar currently ?Her glucose has been in the 200s the last week or so; significantly improved ?Dr Forde Dandy - endocrinology- called her today and we will send records to his office-hopefully she can see him soon ? ?Pt notes she is feeling overall better- still feels "off" some days - overall on the upswing however  ? ?Also noted to have transaminitis, likely due to to hypoglycemia.  We will check on her liver function test today and we will do an acute hep panel for completeness ? ?She is starting a new job at [employer] but has been delayed due to her illness  ?She would like to get her second shingles vaccine today.  After her first shingles vaccine she had a vagal response but no sign of allergy such as redness, hives or rash ? ? ?Patient Active Problem List  ? Diagnosis Date Noted  ? History of squamous cell carcinoma in situ (SCCIS) of skin 12/11/2019  ? NASH (nonalcoholic steatohepatitis) 07/26/2015  ? BMI  39.0-39.9,adult 03/19/2012  ? Hematoma 03/19/2012  ? HTN (hypertension), benign 01/09/2012  ? Osteopenia 01/09/2012  ? Migraines 01/09/2012  ? Insomnia 01/09/2012  ? Anxiety and depression 01/09/2012  ? Hyperlipidemia 01/09/2012  ? Diabetes mellitus 01/09/2012  ? Allergic rhinitis 01/09/2012  ? ? ?Past Medical History:  ?Diagnosis Date  ? Anxiety   ? Cancer [facility])   ? skin- Right shin squamous cell  ? Depression   ? Diabetes mellitus   ? Diabetes mellitus, type II ([place])   ? GERD (gastroesophageal reflux disease)   ? Hematoma   ? on buttock  ? Hyperlipidemia   ? Hypertension   ? Migraine   ? Seasonal allergies   ? ? ?Past Surgical History:  ?Procedure Laterality Date  ? ABDOMINAL HYSTERECTOMY  2004  ? PCOS  ? EYE SURGERY    ? INNER EAR SURGERY Left 08/2016  ? ? ?Social History  ? ?Tobacco Use  ? Smoking status: Never  ? Smokeless tobacco: Never  ?Vaping Use  ? Vaping Use: Never used  ?Substance Use Topics  ? Alcohol use: Not Currently  ?  Alcohol/week: 8.0 - 10.0 standard drinks  ?  Types: 6 Cans of beer, 2 Shots of liquor per week  ? Drug use: No  ? ? ?Family History  ?Problem Relation Age of Onset  ? Cancer Mother 6  ?  colon  ? Diabetes Mother   ? Rheum arthritis Mother   ? Dementia Mother   ? Depression Mother   ? Cancer Father 66  ?     bone and prostate  ? Cancer Maternal Grandmother   ?     breast  ? Diabetes Maternal Grandfather   ? Depression Cousin   ? ? ?Allergies  ?Allergen Reactions  ? Buspar [Buspirone] Other (See Comments)  ?  Lip swelling and rash numbness ?  ? Augmentin [Amoxicillin-Pot Clavulanate] Nausea And Vomiting  ? Biaxin [Clarithromycin]   ? Ceftin [Cefuroxime] Swelling  ?  Eye swelling, shortness of breath ?  ? Ciprofloxacin   ?  Developed itching with IV form in the hospital, but has since taken PO without a problem  ? Dapagliflozin Other (See Comments)  ?  Burning upon urination. Increased blood sugar.  ? Doxycycline Itching  ? Levaquin [Levofloxacin] Hives  ? Metformin And Related  Nausea And Vomiting  ? Phenazopyridine Hcl Other (See Comments)  ?  Does not remember reaction  ? Pyridium [Phenazopyridine Hcl]   ?  Does not remember reaction  ? Sulfa Antibiotics   ?  As a younger person had itching, but has taken more recently and did ok  ? Latex Rash  ? Lorabid [Loracarbef] Rash  ? ? ?Medication list has been reviewed and updated. ? ?Current Outpatient Medications on File Prior to Visit  ?Medication Sig Dispense Refill  ? brexpiprazole (REXULTI) 1 MG TABS tablet Take 1 tablet (1 mg total) by mouth daily. 30 tablet 5  ? citalopram (CELEXA) 40 MG tablet Take 1 tablet (40 mg total) by mouth daily. 90 tablet 3  ? clonazePAM (KLONOPIN) 0.5 MG tablet Take 1 tablet (0.5 mg total) by mouth 2 (two) times daily. 60 tablet 2  ? Dulaglutide (TRULICITY) 3 GT/3.6IW SOPN Inject 3 mg as directed once a week. 6 mL 3  ? glucose blood (ONETOUCH VERIO) test strip Use as instructed- may check blood sugar up to twice a day. 100 each 12  ? glucose blood test strip Use to check glucose 1-2x daily 100 each 12  ? Insulin Glargine (BASAGLAR KWIKPEN) 100 UNIT/ML Give [place] daily- current dose 46 units.  Estimate max dose 50 units 15 mL 5  ? Insulin Pen Needle (BD PEN NEEDLE NANO 2ND GEN) 32G X 4 MM MISC USE AS DIRECTED 100 each 3  ? lisinopril-hydrochlorothiazide (ZESTORETIC) 10-12.5 MG tablet TAKE 1/2 TABLET ONCE A DAY MAY INCREASE TO 1 TABLET DAILY IF DIRECTED BY MD 90 tablet 1  ? Loratadine 10 MG CAPS Take by mouth.    ? Multiple Vitamins-Minerals (MULTIVITAMIN WITH MINERALS) tablet Take 1 tablet by mouth daily.    ? pantoprazole (PROTONIX) 40 MG tablet Take 1 tablet (40 mg total) by mouth daily. 90 tablet 3  ? pioglitazone (ACTOS) 30 MG tablet TAKE 1 TABLET BY MOUTH EVERY DAY 90 tablet 1  ? rosuvastatin (CRESTOR) 40 MG tablet TAKE 1 TABLET BY MOUTH EVERY DAY 90 tablet 0  ? ?No current facility-administered medications on file prior to visit.  ? ? ?Review of Systems: ? ?As per HPI- otherwise negative. ? ? ?Physical  Examination: ?Vitals:  ? 01/03/22 1444  ?BP: 122/80  ?Pulse: 82  ?Resp: 18  ?Temp: 97.8 ?F (36.6 ?C)  ?SpO2: 96%  ? ?Vitals:  ? 01/03/22 1444  ?Weight: 230 lb 9.6 oz (104.6 kg)  ?Height: 5' 6"  (1.676 m)  ? ?Body mass index is 37.22 kg/m?. ?Ideal Body Weight:  Weight in (lb) to have BMI = 25: 154.6 ? ?GEN: no acute distress. ?HEENT: Atraumatic, Normocephalic.  ?Ears and Nose: No external deformity. ?CV: RRR, No M/G/R. No JVD. No thrill. No extra heart sounds. ?PULM: CTA B, no wheezes, crackles, rhonchi. No retractions. No resp. distress. No accessory muscle use. ?ABD: S, NT, ND, +BS. No rebound. No HSM. ?EXTR: No c/c/e ?PSYCH: Normally interactive. Conversant.  ? ?Give second dose of shingles vaccine today.  Patient rested in room for a few minutes, felt fine.  I walked her down to the lab ?Assessment and Plan: ?Type 2 diabetes mellitus with hyperglycemia, without long-term current use of insulin ([place]) - Plan: Ambulatory referral to Endocrinology ? ?Type 2 diabetes mellitus with hyperglycemia, with long-term current use of insulin ([place]) - Plan: Ambulatory referral to Endocrinology ? ?Transaminitis - Plan: Comprehensive metabolic panel, Hepatitis, Acute ? ?Immunization due - Plan: Varicella-zoster vaccine IM (Shingrix) ? ?Patient seen today for follow-up.  Her usually fairly well controlled diabetes became much worse recently when she had to stop Trulicity for several weeks due to lack of supply.  She is now back on 3 mg of Trulicity and is doing better.  We also have gradually increase her dose of insulin ?Labs are pending as above ?Second dose of shingles vaccine given ? ?Signed ?Lamar Blinks, MD ? ?Received her labs 4/5-LMOM for pt as she does not have mychart ?Overall good news ?She is seeing endocrine soon ?LFTs improving ?Will order LFTs- please schedule a lab visit only in about 2 months to get this drawn  ? ?Results for orders placed or performed in visit on 01/03/22  ?Comprehensive metabolic panel  ?Result  Value Ref Range  ? Sodium 135 135 - 145 mEq/L  ? Potassium 4.2 3.5 - 5.1 mEq/L  ? Chloride 99 96 - 112 mEq/L  ? CO2 25 19 - 32 mEq/L  ? Glucose, Bld 468 (H) 70 - 99 mg/dL  ? BUN 11 6 - 23 mg/dL  ? Creatinine,

## 2021-12-30 ENCOUNTER — Other Ambulatory Visit: Payer: Self-pay

## 2021-12-30 DIAGNOSIS — E1165 Type 2 diabetes mellitus with hyperglycemia: Secondary | ICD-10-CM

## 2021-12-30 DIAGNOSIS — E119 Type 2 diabetes mellitus without complications: Secondary | ICD-10-CM

## 2021-12-30 NOTE — Telephone Encounter (Signed)
Referral was sent to [facility]. Appointment will likely be in June- depending on Drs opinion when reviewing notes she could be seen sooner.  ? ?Called pt to let her know, no answer.  ?

## 2021-12-30 NOTE — Telephone Encounter (Signed)
Pt states she was able to in contact with endo coordinator and was advised appointment booked until May.  ? ?Pt would like referral sent elsewhere if appointment is sooner. Pt stated facility was rude.  ? ?Please advise.  ?

## 2021-12-30 NOTE — Telephone Encounter (Signed)
Pt stated still not heard from referral yet. She stated this should be an urgent referral. Please advise.  ?

## 2021-12-30 NOTE — Telephone Encounter (Signed)
Called Dr Forde Dandy with [facility]- had to lvm, Dr Sander Radon to lvm, Dr Buddy Duty- they will not be accepting DM pts.  ?

## 2021-12-31 NOTE — Telephone Encounter (Signed)
Pt called back and I let her know that [facility] has all documentation needed. She would rather be seen there. So [facility] will contact her with appointment info.  ?

## 2022-01-03 ENCOUNTER — Ambulatory Visit (INDEPENDENT_AMBULATORY_CARE_PROVIDER_SITE_OTHER): Payer: BC Managed Care – PPO | Admitting: Family Medicine

## 2022-01-03 VITALS — BP 122/80 | HR 82 | Temp 97.8°F | Resp 18 | Ht 66.0 in | Wt 230.6 lb

## 2022-01-03 DIAGNOSIS — E1165 Type 2 diabetes mellitus with hyperglycemia: Secondary | ICD-10-CM | POA: Diagnosis not present

## 2022-01-03 DIAGNOSIS — R7401 Elevation of levels of liver transaminase levels: Secondary | ICD-10-CM

## 2022-01-03 DIAGNOSIS — Z794 Long term (current) use of insulin: Secondary | ICD-10-CM | POA: Diagnosis not present

## 2022-01-03 DIAGNOSIS — Z23 Encounter for immunization: Secondary | ICD-10-CM | POA: Diagnosis not present

## 2022-01-03 NOTE — Patient Instructions (Addendum)
I will be in touch with your labs asap  ?It sounds like you are on the right track!   ?2nd dose of shingrix today  ?

## 2022-01-04 LAB — COMPREHENSIVE METABOLIC PANEL
ALT: 61 U/L — ABNORMAL HIGH (ref 0–35)
AST: 74 U/L — ABNORMAL HIGH (ref 0–37)
Albumin: 3.9 g/dL (ref 3.5–5.2)
Alkaline Phosphatase: 121 U/L — ABNORMAL HIGH (ref 39–117)
BUN: 11 mg/dL (ref 6–23)
CO2: 25 mEq/L (ref 19–32)
Calcium: 9 mg/dL (ref 8.4–10.5)
Chloride: 99 mEq/L (ref 96–112)
Creatinine, Ser: 0.93 mg/dL (ref 0.40–1.20)
GFR: 68.23 mL/min (ref >60.00)
Glucose, Bld: 468 mg/dL — ABNORMAL HIGH (ref 70–99)
Potassium: 4.2 mEq/L (ref 3.5–5.1)
Sodium: 135 mEq/L (ref 135–145)
Total Bilirubin: 0.4 mg/dL (ref 0.2–1.2)
Total Protein: 6.3 g/dL (ref 6.0–8.3)

## 2022-01-04 LAB — HEPATITIS PANEL, ACUTE
Hep A IgM: NONREACTIVE
Hep B C IgM: NONREACTIVE
Hepatitis B Surface Ag: NONREACTIVE
Hepatitis C Ab: NONREACTIVE
SIGNAL TO CUT-OFF: 0.05 (ref <1.00)

## 2022-01-04 NOTE — Telephone Encounter (Signed)
FYI: pt has an appointment with Dr Chalmers Cater at [facility] on 01/19/2022 t 2:30 PM.  ?

## 2022-01-05 ENCOUNTER — Other Ambulatory Visit: Payer: Self-pay | Admitting: Family Medicine

## 2022-01-05 ENCOUNTER — Other Ambulatory Visit: Payer: Self-pay

## 2022-01-05 DIAGNOSIS — Z1239 Encounter for other screening for malignant neoplasm of breast: Secondary | ICD-10-CM

## 2022-01-05 DIAGNOSIS — E119 Type 2 diabetes mellitus without complications: Secondary | ICD-10-CM

## 2022-01-05 NOTE — Addendum Note (Signed)
Addended by: Lamar Blinks C on: 01/05/2022 08:48 AM ? ? Modules accepted: Orders ? ?

## 2022-01-24 ENCOUNTER — Ambulatory Visit: Payer: BC Managed Care – PPO | Admitting: Adult Health

## 2022-01-24 ENCOUNTER — Encounter: Payer: Self-pay | Admitting: Adult Health

## 2022-01-24 DIAGNOSIS — F428 Other obsessive-compulsive disorder: Secondary | ICD-10-CM

## 2022-01-24 DIAGNOSIS — F331 Major depressive disorder, recurrent, moderate: Secondary | ICD-10-CM | POA: Diagnosis not present

## 2022-01-24 DIAGNOSIS — F411 Generalized anxiety disorder: Secondary | ICD-10-CM

## 2022-01-24 DIAGNOSIS — F410 Panic disorder [episodic paroxysmal anxiety] without agoraphobia: Secondary | ICD-10-CM | POA: Diagnosis not present

## 2022-01-24 DIAGNOSIS — F3341 Major depressive disorder, recurrent, in partial remission: Secondary | ICD-10-CM | POA: Diagnosis not present

## 2022-01-24 MED ORDER — CLONAZEPAM 0.5 MG PO TABS: 0.5000 mg | ORAL_TABLET | Freq: Two times a day (BID) | ORAL | 2 refills | Status: DC

## 2022-01-24 NOTE — Progress Notes (Signed)
[name] ?[id number] ?[birth year] ?56 y.o. ? ?Subjective:  ? ?Patient ID:  Natalie Richard is a 56 y.o. (DOB 1966-03-09) female. ? ?Chief Complaint: No chief complaint on file. ? ? ?HPI ?[name] presents to the office today for follow-up of MDD, GAD, obsesional thoughts, panic disorder. ? ?Describes mood today as "ok". Pleasant. Decreased tearfulness. Mood symptoms - denies depression, anxiety and irritability. Stating "I'm doing alright". Feels like medications continue to work well. Concerned about diabetes - A1C is 12 - working with endocrinologist. New dog adjusting well. Varying interest and motivation. Taking medications as prescribed.  ?Energy levels lower. Active, does not have a regular exercise routine.  ?Enjoys some usual interests and activities. Married. Lives with husband - 1 cat and 1 dog. Spending time with family. ?Appetite decreased. Weight loss - 225 from 230 pounds. ?Sleeps well most nights. Averages 8 hours. ?Focus and concentration difficulties. Completing tasks. Managing aspects of household. Working part-time at [employer]. ?Denies SI or HI.  ?Denies AH or VH. ? ?Previously seen by Dr. Tory Emerald ? ?Previous medication trials: Abilify, Gabapentin, Latuda, Xanax, Effexor ? ? ? ?PHQ2-9   ? ?[place] Office Visit from 12/15/2021 in [facility] [place] Visit from 12/06/2021 in [facility] at [place] Visit from 09/02/2021 in [facility] at [place] Visit from 05/10/2021 in [facility] at [place] Visit from 02/10/2021 in [facility] at [facility]  ?PHQ-2 Total Score 0 0 0 2 0  ?PHQ-9 Total Score 0 -- 3 4 --  ? ?  ? ?Flowsheet Row ED from 12/15/2021 in [place]  ?C-SSRS RISK CATEGORY No Risk  ? ?  ?  ? ?Review of Systems:  ?Review of Systems  ?Musculoskeletal:  Negative for gait  problem.  ?Neurological:  Negative for tremors.  ?Psychiatric/Behavioral:    ?     Please refer to HPI  ? ?Medications: I have reviewed the patient's current medications. ? ?Current Outpatient Medications  ?Medication Sig Dispense Refill  ? brexpiprazole (REXULTI) 1 MG TABS tablet Take 1 tablet (1 mg total) by mouth daily. 30 tablet 5  ? citalopram (CELEXA) 40 MG tablet Take 1 tablet (40 mg total) by mouth daily. 90 tablet 3  ? clonazePAM (KLONOPIN) 0.5 MG tablet Take 1 tablet (0.5 mg total) by mouth 2 (two) times daily. 60 tablet 2  ? Dulaglutide (TRULICITY) 3 CN/4.7SJ SOPN Inject 3 mg as directed once a week. 6 mL 3  ? Insulin Glargine (BASAGLAR KWIKPEN) 100 UNIT/ML Give [place] daily- current dose 46 units.  Estimate max dose 50 units 15 mL 5  ? Insulin Pen Needle (BD PEN NEEDLE NANO 2ND GEN) 32G X 4 MM MISC USE AS DIRECTED 100 each 3  ? lisinopril-hydrochlorothiazide (ZESTORETIC) 10-12.5 MG tablet TAKE 1/2 TABLET ONCE A DAY MAY INCREASE TO 1 TABLET DAILY IF DIRECTED BY MD 90 tablet 1  ? Loratadine 10 MG CAPS Take by mouth.    ? Multiple Vitamins-Minerals (MULTIVITAMIN WITH MINERALS) tablet Take 1 tablet by mouth daily.    ? ONETOUCH VERIO test strip USE AS INSTRUCTED- MAY CHECK BLOOD SUGAR UP TO TWICE A DAY. 100 strip 12  ? pantoprazole (PROTONIX) 40 MG tablet Take 1 tablet (40 mg total) by mouth daily. 90 tablet 3  ? pioglitazone (ACTOS) 30 MG tablet TAKE 1 TABLET BY MOUTH EVERY DAY 90 tablet 1  ?  rosuvastatin (CRESTOR) 40 MG tablet TAKE 1 TABLET BY MOUTH EVERY DAY 90 tablet 0  ? ?No current facility-administered medications for this visit.  ? ? ?Medication Side Effects: None ? ?Allergies:  ?Allergies  ?Allergen Reactions  ? Buspar [Buspirone] Other (See Comments)  ?  Lip swelling and rash numbness ?  ? Augmentin [Amoxicillin-Pot Clavulanate] Nausea And Vomiting  ? Biaxin [Clarithromycin]   ? Ceftin [Cefuroxime] Swelling  ?  Eye swelling, shortness of breath ?  ? Ciprofloxacin   ?  Developed itching with IV form in  the hospital, but has since taken PO without a problem  ? Dapagliflozin Other (See Comments)  ?  Burning upon urination. Increased blood sugar.  ? Doxycycline Itching  ? Levaquin [Levofloxacin] Hives  ? Metformin And Related Nausea And Vomiting  ? Phenazopyridine Hcl Other (See Comments)  ?  Does not remember reaction  ? Pyridium [Phenazopyridine Hcl]   ?  Does not remember reaction  ? Sulfa Antibiotics   ?  As a younger person had itching, but has taken more recently and did ok  ? Latex Rash  ? Lorabid [Loracarbef] Rash  ? ? ?Past Medical History:  ?Diagnosis Date  ? Anxiety   ? Cancer [facility])   ? skin- Right shin squamous cell  ? Depression   ? Diabetes mellitus   ? Diabetes mellitus, type II ([place])   ? GERD (gastroesophageal reflux disease)   ? Hematoma   ? on buttock  ? Hyperlipidemia   ? Hypertension   ? Migraine   ? Seasonal allergies   ? ? ?Past Medical History, Surgical history, Social history, and Family history were reviewed and updated as appropriate.  ? ?Please see review of systems for further details on the patient's review from today.  ? ?Objective:  ? ?Physical Exam:  ?There were no vitals taken for this visit. ? ?Physical Exam ?Constitutional:   ?   General: She is not in acute distress. ?Musculoskeletal:     ?   General: No deformity.  ?Neurological:  ?   Mental Status: She is alert and oriented to person, place, and time.  ?   Coordination: Coordination normal.  ?Psychiatric:     ?   Attention and Perception: Attention and perception normal. She does not perceive auditory or visual hallucinations.     ?   Mood and Affect: Mood normal. Mood is not anxious or depressed. Affect is not labile, blunt, angry or inappropriate.     ?   Speech: Speech normal.     ?   Behavior: Behavior normal.     ?   Thought Content: Thought content normal. Thought content is not paranoid or delusional. Thought content does not include homicidal or suicidal ideation. Thought content does not include homicidal or suicidal  plan.     ?   Cognition and Memory: Cognition and memory normal.     ?   Judgment: Judgment normal.  ?   Comments: Insight intact  ? ? ?Lab Review:  ?   ?Component Value Date/Time  ? NA 135 01/03/2022 1515  ? K 4.2 01/03/2022 1515  ? CL 99 01/03/2022 1515  ? CO2 25 01/03/2022 1515  ? GLUCOSE 468 (H) 01/03/2022 1515  ? BUN 11 01/03/2022 1515  ? CREATININE 0.93 01/03/2022 1515  ? CREATININE 0.81 07/06/2020 1128  ? CALCIUM 9.0 01/03/2022 1515  ? PROT 6.3 01/03/2022 1515  ? ALBUMIN 3.9 01/03/2022 1515  ? AST 74 (H) 01/03/2022 1515  ? ALT  61 (H) 01/03/2022 1515  ? ALKPHOS 121 (H) 01/03/2022 1515  ? BILITOT 0.4 01/03/2022 1515  ? GFRNONAA >60 12/15/2021 1245  ? GFRNONAA >89 04/08/2016 1426  ? GFRAA >60 04/02/2020 1243  ? GFRAA >89 04/08/2016 1426  ? ? ?   ?Component Value Date/Time  ? WBC 7.5 12/15/2021 1245  ? WBC 7.6 12/15/2021 1245  ? RBC 5.31 (H) 12/15/2021 1245  ? RBC 5.32 (H) 12/15/2021 1245  ? HGB 15.0 12/15/2021 1321  ? HCT 44.0 12/15/2021 1321  ? PLT 315 12/15/2021 1245  ? PLT 323 12/15/2021 1245  ? MCV 82.5 12/15/2021 1245  ? MCV 82.5 12/15/2021 1245  ? MCV 78.8 (A) 03/29/2015 4469  ? MCH 28.4 12/15/2021 1245  ? MCH 28.4 12/15/2021 1245  ? MCHC 34.5 12/15/2021 1245  ? MCHC 34.4 12/15/2021 1245  ? RDW 13.4 12/15/2021 1245  ? RDW 13.6 12/15/2021 1245  ? LYMPHSABS 2.7 12/15/2021 1245  ? MONOABS 0.5 12/15/2021 1245  ? EOSABS 0.1 12/15/2021 1245  ? BASOSABS 0.0 12/15/2021 1245  ? ? ?No results found for: POCLITH, LITHIUM  ? ?No results found for: PHENYTOIN, PHENOBARB, VALPROATE, CBMZ  ? ?.res ?Assessment: Plan:   ? ?Plan: ? ?PDMP reviewed ? ?1. Celexa 718m daily ?2. Clonazepam 0.522mBID ?3. Rexulti 18m47maily  ? ?RTC 3 months ? ?Patient advised to contact office with any questions, adverse effects, or acute worsening in signs and symptoms. ? ?Discussed potential benefits, risk, and side effects of benzodiazepines to include potential risk of tolerance and dependence, as well as possible drowsiness.  Advised patient  not to drive if experiencing drowsiness and to take lowest possible effective dose to minimize risk of dependence and tolerance. ? ?Discussed potential metabolic side effects associated with atypical antip

## 2022-02-09 ENCOUNTER — Ambulatory Visit: Payer: BC Managed Care – PPO | Admitting: Family Medicine

## 2022-02-09 VITALS — BP 116/80 | HR 79 | Temp 97.9°F | Resp 18 | Ht 66.0 in | Wt 231.4 lb

## 2022-02-09 DIAGNOSIS — S90222A Contusion of left lesser toe(s) with damage to nail, initial encounter: Secondary | ICD-10-CM

## 2022-02-09 NOTE — Progress Notes (Signed)
Therapist, music at [facility] ?[address], [address] ?[place], [place] [address] ?336 [phone] ?Fax [phone] ? ?Date:  02/09/2022  ? ?Name:  Natalie Richard   DOB:  05/10/1966   MRN:  [id number] ? ?PCP:  Darreld Mclean, MD  ? ? ?Chief Complaint: Nail Problem (Pt has noticed a place under the L great toenail that might be fungus or a bruise. ) ? ? ?History of Present Illness: ? ?Natalie Richard is a 56 y.o. very pleasant female patient who presents with the following: ? ?Pt seen today with concern of possible toenail fungus or other toenail issue ?Last seen by myself about a month ago - we recently had a lot of trouble wit her DM control,she is now seeing endocrinology  ?She is using 50 units of basaglar am and 70 u pm, and they increased her trulicity to 4.5 ?She is tolerating this well  ?They are not doing mealtime inuslin right now- hopefully this will not be necessary ? ?Patient notes a dark mark seem to appear suddenly on her left great toenail 1 to 2 weeks ago.  She is not aware of any particular trauma to the nail.  The mark was darker at first, seems to be fading ? ?Patient Active Problem List  ? Diagnosis Date Noted  ? History of squamous cell carcinoma in situ (SCCIS) of skin 12/11/2019  ? NASH (nonalcoholic steatohepatitis) 07/26/2015  ? BMI 39.0-39.9,adult 03/19/2012  ? Hematoma 03/19/2012  ? HTN (hypertension), benign 01/09/2012  ? Osteopenia 01/09/2012  ? Migraines 01/09/2012  ? Insomnia 01/09/2012  ? Anxiety and depression 01/09/2012  ? Hyperlipidemia 01/09/2012  ? Diabetes mellitus 01/09/2012  ? Allergic rhinitis 01/09/2012  ? ? ?Past Medical History:  ?Diagnosis Date  ? Anxiety   ? Cancer [facility])   ? skin- Right shin squamous cell  ? Depression   ? Diabetes mellitus   ? Diabetes mellitus, type II ([place])   ? GERD (gastroesophageal reflux disease)   ? Hematoma   ? on buttock  ? Hyperlipidemia   ? Hypertension   ? Migraine   ? Seasonal allergies   ? ? ?Past Surgical History:   ?Procedure Laterality Date  ? ABDOMINAL HYSTERECTOMY  2004  ? PCOS  ? EYE SURGERY    ? INNER EAR SURGERY Left 08/2016  ? ? ?Social History  ? ?Tobacco Use  ? Smoking status: Never  ? Smokeless tobacco: Never  ?Vaping Use  ? Vaping Use: Never used  ?Substance Use Topics  ? Alcohol use: Not Currently  ?  Alcohol/week: 8.0 - 10.0 standard drinks  ?  Types: 6 Cans of beer, 2 Shots of liquor per week  ? Drug use: No  ? ? ?Family History  ?Problem Relation Age of Onset  ? Cancer Mother 53  ?     colon  ? Diabetes Mother   ? Rheum arthritis Mother   ? Dementia Mother   ? Depression Mother   ? Cancer Father 47  ?     bone and prostate  ? Cancer Maternal Grandmother   ?     breast  ? Diabetes Maternal Grandfather   ? Depression Cousin   ? ? ?Allergies  ?Allergen Reactions  ? Buspar [Buspirone] Other (See Comments)  ?  Lip swelling and rash numbness ?  ? Augmentin [Amoxicillin-Pot Clavulanate] Nausea And Vomiting  ? Biaxin [Clarithromycin]   ? Ceftin [Cefuroxime] Swelling  ?  Eye swelling, shortness of breath ?  ?  Ciprofloxacin   ?  Developed itching with IV form in the hospital, but has since taken PO without a problem  ? Dapagliflozin Other (See Comments)  ?  Burning upon urination. Increased blood sugar.  ? Doxycycline Itching  ? Levaquin [Levofloxacin] Hives  ? Metformin And Related Nausea And Vomiting  ? Phenazopyridine Hcl Other (See Comments)  ?  Does not remember reaction  ? Pyridium [Phenazopyridine Hcl]   ?  Does not remember reaction  ? Sulfa Antibiotics   ?  As a younger person had itching, but has taken more recently and did ok  ? Latex Rash  ? Lorabid [Loracarbef] Rash  ? ? ?Medication list has been reviewed and updated. ? ?Current Outpatient Medications on File Prior to Visit  ?Medication Sig Dispense Refill  ? brexpiprazole (REXULTI) 1 MG TABS tablet Take 1 tablet (1 mg total) by mouth daily. 30 tablet 5  ? citalopram (CELEXA) 40 MG tablet Take 1 tablet (40 mg total) by mouth daily. 90 tablet 3  ? clonazePAM  (KLONOPIN) 0.5 MG tablet Take 1 tablet (0.5 mg total) by mouth 2 (two) times daily. 60 tablet 2  ? Dulaglutide (TRULICITY) 4.5 DX/4.1OI SOPN 4.47m    ? Insulin Glargine (BASAGLAR KWIKPEN) 100 UNIT/ML Give [place] daily- current dose 46 units.  Estimate max dose 50 units (Patient taking differently: Give [place] daily- current dose 50 units Am 70 units PM.) 15 mL 5  ? Insulin Pen Needle (BD PEN NEEDLE NANO 2ND GEN) 32G X 4 MM MISC USE AS DIRECTED 100 each 3  ? Loratadine 10 MG CAPS Take by mouth.    ? Multiple Vitamins-Minerals (MULTIVITAMIN WITH MINERALS) tablet Take 1 tablet by mouth daily.    ? ONETOUCH VERIO test strip USE AS INSTRUCTED- MAY CHECK BLOOD SUGAR UP TO TWICE A DAY. 100 strip 12  ? pantoprazole (PROTONIX) 40 MG tablet Take 1 tablet (40 mg total) by mouth daily. 90 tablet 3  ? pioglitazone (ACTOS) 30 MG tablet TAKE 1 TABLET BY MOUTH EVERY DAY 90 tablet 1  ? rosuvastatin (CRESTOR) 40 MG tablet TAKE 1 TABLET BY MOUTH EVERY DAY 90 tablet 0  ? ?No current facility-administered medications on file prior to visit.  ? ? ?Review of Systems: ? ?As per HPI- otherwise negative. ? ? ?Physical Examination: ?Vitals:  ? 02/09/22 1124  ?BP: 116/80  ?Pulse: 79  ?Resp: 18  ?Temp: 97.9 ?F (36.6 ?C)  ?SpO2: 95%  ? ?Vitals:  ? 02/09/22 1124  ?Weight: 231 lb 6.4 oz (105 kg)  ?Height: 5' 6"  (1.676 m)  ? ?Body mass index is 37.35 kg/m?. ?Ideal Body Weight: Weight in (lb) to have BMI = 25: 154.6 ? ?GEN: No acute distress; alert,appropriate.  Obese, looks well ?PULM: Breathing comfortably in no respiratory distress ?PSYCH: Normally interactive.  ?Left great toenail displays a likely faint subungual hematoma. ?Strong pedal pulse in the left foot ?Other toenails are all normal ?Assessment and Plan: ?Toenail bruise, left, initial encounter ? ?Advised patient she likely bruised her nailbed and had some bleeding.  Assuming this dark mark bruise that is expected I do not think any cause for alarm.  However, if not growing out we will want to  have her evaluated for possible skin cancer.  She agrees to monitor this area and make sure it is growing out as expected ? ?No other concerns today ? ?Did not charge patient for this very minor concern today  ?Signed ?JLamar Blinks MD ? ?

## 2022-02-12 ENCOUNTER — Other Ambulatory Visit: Payer: Self-pay | Admitting: Family Medicine

## 2022-03-18 ENCOUNTER — Encounter: Payer: Self-pay | Admitting: Adult Health

## 2022-03-18 ENCOUNTER — Ambulatory Visit: Payer: BC Managed Care – PPO | Admitting: Adult Health

## 2022-03-18 DIAGNOSIS — F428 Other obsessive-compulsive disorder: Secondary | ICD-10-CM | POA: Diagnosis not present

## 2022-03-18 DIAGNOSIS — F331 Major depressive disorder, recurrent, moderate: Secondary | ICD-10-CM

## 2022-03-18 DIAGNOSIS — F3341 Major depressive disorder, recurrent, in partial remission: Secondary | ICD-10-CM

## 2022-03-18 DIAGNOSIS — F410 Panic disorder [episodic paroxysmal anxiety] without agoraphobia: Secondary | ICD-10-CM

## 2022-03-18 MED ORDER — BREXPIPRAZOLE 1 MG PO TABS: ORAL_TABLET | ORAL | 5 refills | Status: DC

## 2022-03-18 MED ORDER — CLONAZEPAM 0.5 MG PO TABS: 0.5000 mg | ORAL_TABLET | Freq: Two times a day (BID) | ORAL | 2 refills | Status: DC

## 2022-03-18 MED ORDER — CITALOPRAM HYDROBROMIDE 40 MG PO TABS: 40.0000 mg | ORAL_TABLET | Freq: Every day | ORAL | 3 refills | Status: DC

## 2022-03-18 NOTE — Progress Notes (Signed)
ICEY TELLO [id number] 08-01-1966 56 y.o.  Subjective:   Patient ID:  Natalie Richard is a 56 y.o. (DOB 1965-10-06) female.  Chief Complaint: No chief complaint on file.   HPI Natalie Richard presents to the office today for follow-up of MDD, GAD, obsesional thoughts, panic disorder.  Describes mood today as "ok". Pleasant. Decreased tearfulness. Mood symptoms - reports depression - "wanting to sleep a lot". Not completing self care - or taking care of herself. Loss of interest - pushing herself. Stating "I'm not doing as good as I was". Feels like she may have some unresolved grief with loss of her 2 dogs last November and January. Husband recently fell and tore rotator cuff.  Stating "I'm not doing as well as I was". Feels like medications continue to work well. Working with endocrinologist and her A1C is down from 12.d to 9. Varying interest and motivation. Taking medications as prescribed.  Energy levels lower than normal - "making herself go". Active, does not have a regular exercise routine.  Enjoys some usual interests and activities. Married. Lives with husband - 1 cat - 1 dog. Spending time with family. Appetite decreased. Weight stable 230 pounds. Sleeps well most nights. Averages 8 or more hours. Focus and concentration difficulties. Completing tasks. Managing aspects of household. Not working currently. Denies SI or HI.  Denies AH or VH.  Previously seen by Dr. Tory Emerald  Previous medication trials: Abilify, Gabapentin, Shary Decamp, Effexor   PHQ2-9    [place] [facility] Visit from 12/15/2021 in [facility] [place] Visit from 12/06/2021 in [place] at [place] Visit from 09/02/2021 in [place] at [place] Visit from 05/10/2021 in [place] at [place] Visit from 02/10/2021 in [place] at [facility] [place]  PHQ-2 Total Score 0 0 0 2 0  PHQ-9 Total Score 0 -- 3 4 --      Flowsheet Row ED from 12/15/2021 in [place] No Risk        Review of Systems:  Review of Systems  Musculoskeletal:  Negative for gait problem.  Neurological:  Negative for tremors.  Psychiatric/Behavioral:         Please refer to HPI    Medications: I have reviewed the patient's current medications.  Current Outpatient Medications  Medication Sig Dispense Refill   brexpiprazole (REXULTI) 1 MG TABS tablet Take one and 1/2 tablets daily. 45 tablet 5   citalopram (CELEXA) 40 MG tablet Take 1 tablet (40 mg total) by mouth daily. 90 tablet 3   clonazePAM (KLONOPIN) 0.5 MG tablet Take 1 tablet (0.5 mg total) by mouth 2 (two) times daily. 60 tablet 2   Dulaglutide (TRULICITY) 4.5 GL/8.7FI SOPN 4.49m     Insulin Glargine (BASAGLAR KWIKPEN) 100 UNIT/ML Give [place] daily- current dose 46 units.  Estimate max dose 50 units (Patient taking differently: Give [place] daily- current dose 50 units Am 70 units PM.) 15 mL 5   Insulin Pen Needle (BD PEN NEEDLE NANO 2ND GEN) 32G X 4 MM MISC USE AS DIRECTED 100 each 3   Loratadine 10 MG CAPS Take by mouth.     Multiple Vitamins-Minerals (MULTIVITAMIN WITH MINERALS) tablet Take 1 tablet by mouth daily.     ONETOUCH VERIO test strip USE AS INSTRUCTED- MAY CHECK BLOOD SUGAR UP TO TWICE A DAY. 100 strip  12   pantoprazole (PROTONIX) 40 MG tablet TAKE 1 TABLET BY MOUTH EVERY DAY 90 tablet 3   pioglitazone (ACTOS) 30 MG tablet TAKE 1 TABLET BY MOUTH EVERY DAY 90 tablet 1   rosuvastatin (CRESTOR) 40 MG tablet TAKE 1 TABLET BY MOUTH EVERY DAY 90 tablet 0   No current facility-administered medications for this visit.    Medication Side Effects: None  Allergies:  Allergies  Allergen Reactions   Buspar [Buspirone] Other (See Comments)    Lip swelling and rash numbness    Augmentin [Amoxicillin-Pot Clavulanate] Nausea And  Vomiting   Biaxin [Clarithromycin]    Ceftin [Cefuroxime] Swelling    Eye swelling, shortness of breath    Ciprofloxacin     Developed itching with IV form in the hospital, but has since taken PO without a problem   Dapagliflozin Other (See Comments)    Burning upon urination. Increased blood sugar.   Doxycycline Itching   Levaquin [Levofloxacin] Hives   Metformin And Related Nausea And Vomiting   Phenazopyridine Hcl Other (See Comments)    Does not remember reaction   Pyridium [Phenazopyridine Hcl]     Does not remember reaction   Sulfa Antibiotics     As a younger person had itching, but has taken more recently and did ok   Latex Rash   Lorabid [Loracarbef] Rash    Past Medical History:  Diagnosis Date   Anxiety    Cancer ([place])    skin- Right shin squamous cell   Depression    Diabetes mellitus    Diabetes mellitus, type II ([place])    GERD (gastroesophageal reflux disease)    Hematoma    on buttock   Hyperlipidemia    Hypertension    Migraine    Seasonal allergies     Past Medical History, Surgical history, Social history, and Family history were reviewed and updated as appropriate.   Please see review of systems for further details on the patient's review from today.   Objective:   Physical Exam:  There were no vitals taken for this visit.  Physical Exam Constitutional:      General: She is not in acute distress. Musculoskeletal:        General: No deformity.  Neurological:     Mental Status: She is alert and oriented to person, place, and time.     Coordination: Coordination normal.  Psychiatric:        Attention and Perception: Attention and perception normal. She does not perceive auditory or visual hallucinations.        Mood and Affect: Mood normal. Mood is not anxious or depressed. Affect is not labile, blunt, angry or inappropriate.        Speech: Speech normal.        Behavior: Behavior normal.        Thought Content: Thought content normal.  Thought content is not paranoid or delusional. Thought content does not include homicidal or suicidal ideation. Thought content does not include homicidal or suicidal plan.        Cognition and Memory: Cognition and memory normal.        Judgment: Judgment normal.     Comments: Insight intact     Lab Review:     Component Value Date/Time   NA 135 01/03/2022 1515   K 4.2 01/03/2022 1515   CL 99 01/03/2022 1515   CO2 25 01/03/2022 1515   GLUCOSE 468 (H) 01/03/2022 1515   BUN 11 01/03/2022 1515  CREATININE 0.93 01/03/2022 1515   CREATININE 0.81 07/06/2020 1128   CALCIUM 9.0 01/03/2022 1515   PROT 6.3 01/03/2022 1515   ALBUMIN 3.9 01/03/2022 1515   AST 74 (H) 01/03/2022 1515   ALT 61 (H) 01/03/2022 1515   ALKPHOS 121 (H) 01/03/2022 1515   BILITOT 0.4 01/03/2022 1515   GFRNONAA >60 12/15/2021 1245   GFRNONAA >89 04/08/2016 1426   GFRAA >60 04/02/2020 1243   GFRAA >89 04/08/2016 1426       Component Value Date/Time   WBC 7.5 12/15/2021 1245   WBC 7.6 12/15/2021 1245   RBC 5.31 (H) 12/15/2021 1245   RBC 5.32 (H) 12/15/2021 1245   HGB 15.0 12/15/2021 1321   HCT 44.0 12/15/2021 1321   PLT 315 12/15/2021 1245   PLT 323 12/15/2021 1245   MCV 82.5 12/15/2021 1245   MCV 82.5 12/15/2021 1245   MCV 78.8 (A) 03/29/2015 0938   MCH 28.4 12/15/2021 1245   MCH 28.4 12/15/2021 1245   MCHC 34.5 12/15/2021 1245   MCHC 34.4 12/15/2021 1245   RDW 13.4 12/15/2021 1245   RDW 13.6 12/15/2021 1245   LYMPHSABS 2.7 12/15/2021 1245   MONOABS 0.5 12/15/2021 1245   EOSABS 0.1 12/15/2021 1245   BASOSABS 0.0 12/15/2021 1245    No results found for: "POCLITH", "LITHIUM"   No results found for: "PHENYTOIN", "PHENOBARB", "VALPROATE", "CBMZ"   .res Assessment: Plan:    Plan:  PDMP reviewed  1. Celexa 26m daily 2. Clonazepam 0.566mBID 3. Increase Rexulti 21m17mo 1.5mg67mily   RTC 3 months  Patient advised to contact office with any questions, adverse effects, or acute worsening in  signs and symptoms.  Discussed potential benefits, risk, and side effects of benzodiazepines to include potential risk of tolerance and dependence, as well as possible drowsiness.  Advised patient not to drive if experiencing drowsiness and to take lowest possible effective dose to minimize risk of dependence and tolerance.  Discussed potential metabolic side effects associated with atypical antipsychotics, as well as potential risk for movement side effects. Advised pt to contact office if movement side effects occur.   Diagnoses and all orders for this visit:  Obsessional thoughts  Recurrent major depressive disorder, in partial remission (HCC)[place]    citalopram (CELEXA) 40 MG tablet; Take 1 tablet (40 mg total) by mouth daily.  Panic disorder -     citalopram (CELEXA) 40 MG tablet; Take 1 tablet (40 mg total) by mouth daily. -     clonazePAM (KLONOPIN) 0.5 MG tablet; Take 1 tablet (0.5 mg total) by mouth 2 (two) times daily.  Major depressive disorder, recurrent episode, moderate (HCC) -     brexpiprazole (REXULTI) 1 MG TABS tablet; Take one and 1/2 tablets daily.     Please see After Visit Summary for patient specific instructions.  Future Appointments  Date Time Provider Depa[place]24/2023 10:20 AM Montrel Donahoe, RegiBerdie Ogren CP-CP None    No orders of the defined types were placed in this encounter.   -------------------------------

## 2022-04-06 ENCOUNTER — Telehealth: Payer: Self-pay | Admitting: Adult Health

## 2022-04-06 NOTE — Telephone Encounter (Signed)
I don't see a PA for her ,it may need one

## 2022-04-06 NOTE — Telephone Encounter (Signed)
Pt called and said that it will cost her $ 300 dollars for the rexulti  1 mg take 1 1/2 daily even with a coupon card. However if gina sends in a 2 mg script of rexulti it will only cost her $ 15. She never picked up the script in June. Is a PA needed? Please call pt and let her know what the next step is. [phone]

## 2022-04-07 NOTE — Telephone Encounter (Signed)
LVM to rtc 

## 2022-04-07 NOTE — Telephone Encounter (Signed)
Noted will contact pharmacy for clarification

## 2022-04-07 NOTE — Telephone Encounter (Signed)
The co-pay card isn't working with the quantity of #45 Rexulti 1 mg take 1.5 tabs daily. The cost is $222.00 with her insurance.   Either pt needs to decrease back to 1 mg daily or increase to 2 mg or do two separate Rx's 1 of the 1 mg and 1 of the 0.5 mg.   Please advise

## 2022-04-07 NOTE — Telephone Encounter (Signed)
Let's check with patient .

## 2022-04-08 NOTE — Telephone Encounter (Signed)
Noted. Ty.

## 2022-04-08 NOTE — Telephone Encounter (Signed)
She said she will decrease back to 1 mg.She called the pharmacy and they will fill it

## 2022-04-15 ENCOUNTER — Telehealth: Payer: Self-pay | Admitting: Adult Health

## 2022-04-15 NOTE — Telephone Encounter (Signed)
Pt called at 2:30 pm and said that she was only able to get 30 pills of the rexulti 1 mg per insurance. She said that if they gave her 1 and 1/2 mg it would be over 200 dollars. I pulled samples  of the 0.5 mg rexulti . She said that they have 1 mg or 2 mg. So she wants to know can she get 2 mg sent in or can we do a PA. Please give her a call at [phone]

## 2022-04-18 NOTE — Telephone Encounter (Signed)
She needs a PA for 1.5 tabs of Rexulti. I haven't seen anything in Cover My Meds though. She is taking 1.5, not 2 mg.

## 2022-04-21 ENCOUNTER — Ambulatory Visit: Payer: BC Managed Care – PPO | Admitting: Family

## 2022-04-21 VITALS — BP 120/90 | HR 88 | Temp 98.2°F | Resp 18 | Ht 66.0 in | Wt 231.0 lb

## 2022-04-21 DIAGNOSIS — R300 Dysuria: Secondary | ICD-10-CM

## 2022-04-21 LAB — POC URINALSYSI DIPSTICK (AUTOMATED)
Blood, UA: NEGATIVE
Glucose, UA: POSITIVE — AB
Leukocytes, UA: NEGATIVE
Nitrite, UA: NEGATIVE
Protein, UA: NEGATIVE
Spec Grav, UA: 1.025 (ref 1.010–1.025)
Urobilinogen, UA: 0.2 E.U./dL
pH, UA: 5 (ref 5.0–8.0)

## 2022-04-21 MED ORDER — NITROFURANTOIN MONOHYD MACRO 100 MG PO CAPS: 100.0000 mg | ORAL_CAPSULE | Freq: Two times a day (BID) | ORAL | 0 refills | Status: DC

## 2022-04-21 NOTE — Progress Notes (Signed)
Natalie Richard is a 56 y.o. female with the following history as recorded in [employer]:  Patient Active Problem List   Diagnosis Date Noted   History of squamous cell carcinoma in situ (SCCIS) of skin 12/11/2019   NASH (nonalcoholic steatohepatitis) 07/26/2015   BMI 39.0-39.9,adult 03/19/2012   Hematoma 03/19/2012   HTN (hypertension), benign 01/09/2012   Osteopenia 01/09/2012   Migraines 01/09/2012   Insomnia 01/09/2012   Anxiety and depression 01/09/2012   Hyperlipidemia 01/09/2012   Diabetes mellitus 01/09/2012   Allergic rhinitis 01/09/2012    Current Outpatient Medications  Medication Sig Dispense Refill   brexpiprazole (REXULTI) 1 MG TABS tablet Take one and 1/2 tablets daily. 45 tablet 5   citalopram (CELEXA) 40 MG tablet Take 1 tablet (40 mg total) by mouth daily. 90 tablet 3   clonazePAM (KLONOPIN) 0.5 MG tablet Take 1 tablet (0.5 mg total) by mouth 2 (two) times daily. 60 tablet 2   Dulaglutide (TRULICITY) 4.5 OA/4.1YS SOPN 4.76m     Insulin Glargine (BASAGLAR KWIKPEN) 100 UNIT/ML Give [place] daily- current dose 46 units.  Estimate max dose 50 units (Patient taking differently: Give  daily- current dose 50 units Am 70 units PM.) 15 mL 5   Insulin Pen Needle (BD PEN NEEDLE NANO 2ND GEN) 32G X 4 MM MISC USE AS DIRECTED 100 each 3   Loratadine 10 MG CAPS Take by mouth.     Multiple Vitamins-Minerals (MULTIVITAMIN WITH MINERALS) tablet Take 1 tablet by mouth daily.     nitrofurantoin, macrocrystal-monohydrate, (MACROBID) 100 MG capsule Take 1 capsule (100 mg total) by mouth 2 (two) times daily. 14 capsule 0   ONETOUCH VERIO test strip USE AS INSTRUCTED- MAY CHECK BLOOD SUGAR UP TO TWICE A DAY. 100 strip 12   pantoprazole (PROTONIX) 40 MG tablet TAKE 1 TABLET BY MOUTH EVERY DAY 90 tablet 3   pioglitazone (ACTOS) 30 MG tablet TAKE 1 TABLET BY MOUTH EVERY DAY 90 tablet 1   rosuvastatin (CRESTOR) 40 MG tablet TAKE 1 TABLET BY MOUTH EVERY DAY 90 tablet 0   No current  facility-administered medications for this visit.    Allergies: Buspar [buspirone], Augmentin [amoxicillin-pot clavulanate], Biaxin [clarithromycin], Ceftin [cefuroxime], Ciprofloxacin, Dapagliflozin, Doxycycline, Levaquin [levofloxacin], Metformin and related, Phenazopyridine hcl, Pyridium [phenazopyridine hcl], Sulfa antibiotics, Latex, and Lorabid [loracarbef]  Past Medical History:  Diagnosis Date   Anxiety    [place]    skin- Right shin squamous cell   Depression    Diabetes mellitus    Diabetes mellitus, type II ([place]    GERD (gastroesophageal reflux disease)    Hematoma    on buttock   Hyperlipidemia    Hypertension    Migraine    Seasonal allergies     Past Surgical History:  Procedure Laterality Date   ABDOMINAL HYSTERECTOMY  2004   PCOS   EYE SURGERY     INNER EAR SURGERY Left 08/2016    Family History  Problem Relation Age of Onset   Cancer Mother 53      colon   Diabetes Mother    Rheum arthritis Mother    Dementia Mother    Depression Mother    Cancer Father 654      bone and prostate   Cancer Maternal Grandmother        breast   Diabetes Maternal Grandfather    Depression Cousin     Social History   Tobacco Use   Smoking status: Never   Smokeless tobacco: Never  Substance Use Topics   Alcohol use: Not Currently    Alcohol/week: 8.0 - 10.0 standard drinks of alcohol    Types: 6 Cans of beer, 2 Shots of liquor per week    Subjective:  Presents with concerns for possible UTI; + burning/ urgency; sense of incomplete emptying; no fever, no blood in urine;    Objective:  Vitals:   04/21/22 1450  BP: 120/90  Pulse: 88  Resp: 18  Temp: 98.2 F (36.8 C)  TempSrc: Oral  SpO2: 96%  Weight: 231 lb (104.8 kg)  Height: 5' 6"  (1.676 m)    General: Well developed, well nourished, in no acute distress  Skin : Warm and dry.  Head: Normocephalic and atraumatic  Eyes: Sclera and conjunctiva clear; pupils round and reactive to light; extraocular  movements intact  Ears: External normal; canals clear; tympanic membranes normal  Oropharynx: Pink, supple. No suspicious lesions  Neck: Supple without thyromegaly, adenopathy  Lungs: Respirations unlabored;  Neurologic: Alert and oriented; speech intact; face symmetrical; moves all extremities well; CNII-XII intact without focal deficit   Assessment:  1. Dysuria     Plan:   Will treat for suspect UTI; check U/A and urine culture; Rx for Macrobid 100 mg bid x 7 days; increase fluids, rest and follow up worse, no better.   No follow-ups on file.  Orders Placed This Encounter  Procedures   Urine Culture   POCT Urinalysis Dipstick (Automated)    Requested Prescriptions   Signed Prescriptions Disp Refills   nitrofurantoin, macrocrystal-monohydrate, (MACROBID) 100 MG capsule 14 capsule 0    Sig: Take 1 capsule (100 mg total) by mouth 2 (two) times daily.

## 2022-04-21 NOTE — Telephone Encounter (Signed)
The previous phone message states pt either has to do 1 mg or 2 mg or two separate Rx's of 0.5 mg and a 1 mg. The co-pay card won't work with a quantity of #45.

## 2022-04-22 LAB — URINE CULTURE
MICRO NUMBER:: 13673118
SPECIMEN QUALITY:: ADEQUATE

## 2022-04-25 ENCOUNTER — Ambulatory Visit: Payer: BC Managed Care – PPO | Admitting: Adult Health

## 2022-04-25 ENCOUNTER — Encounter: Payer: Self-pay | Admitting: Adult Health

## 2022-04-25 ENCOUNTER — Encounter: Payer: Self-pay | Admitting: *Deleted

## 2022-04-25 DIAGNOSIS — F331 Major depressive disorder, recurrent, moderate: Secondary | ICD-10-CM | POA: Diagnosis not present

## 2022-04-25 DIAGNOSIS — F410 Panic disorder [episodic paroxysmal anxiety] without agoraphobia: Secondary | ICD-10-CM | POA: Diagnosis not present

## 2022-04-25 DIAGNOSIS — F428 Other obsessive-compulsive disorder: Secondary | ICD-10-CM

## 2022-04-25 DIAGNOSIS — F411 Generalized anxiety disorder: Secondary | ICD-10-CM | POA: Diagnosis not present

## 2022-04-25 MED ORDER — BUPROPION HCL ER (XL) 150 MG PO TB24: ORAL_TABLET | ORAL | 2 refills | Status: DC

## 2022-04-25 MED ORDER — BREXPIPRAZOLE 1 MG PO TABS: ORAL_TABLET | ORAL | 2 refills | Status: DC

## 2022-04-25 NOTE — Progress Notes (Signed)
Natalie Richard [id number] 1965/12/26 56 y.o.  Subjective:   Patient ID:  Natalie Richard is a 56 y.o. (DOB Aug 06, 1966) female.  Chief Complaint: No chief complaint on file.   HPI KESTREL MIS presents to the office today for follow-up of MDD, GAD, obsesional thoughts, panic disorder.  Describes mood today as "ok". Pleasant. Decreased tearfulness. Mood symptoms - reports depression, anxiety and irritability. Denies worry and rumination. Mood is lower. Stating "I don't feel like doing anything". Unable to maintain self care - bathing twice a week. Feels like she has to push herself to do things. Reporting some ongoing grief with loss of dogs. Has a new puppy - "she makes me feel better". Husband has a torn rotator cuff - awaiting surgery - needs approval by cardiology. Husband's family suggesting she get a second job while husband out of work and that "upset" her. Feels like medications are  Working with endocrinologist and her A1C is down from 12.d to 9. Varying interest and motivation. Taking medications as prescribed.  Energy levels lower. Active, does not have a regular exercise routine.  Enjoys some usual interests and activities. Married. Lives with husband - 1 cat - 1 dog. Spending time with family. Appetite decreased. Weight stable 230 pounds. Sleeps well most nights. Averages 8 or more hours. Focus and concentration difficulties. Completing tasks. Managing aspects of household. Not working currently. Denies SI or HI.  Denies AH or VH.  Previously seen by Dr. Tory Emerald  Previous medication trials: Abilify, Gabapentin, Shary Decamp, Effexor    PHQ2-9    [place] Office Visit from 12/15/2021 in [facility] at [place] Visit from 12/06/2021 in [place] at [place] Visit from 09/02/2021 in [place] at [place] Visit from 05/10/2021 in [place]  at [place] Visit from 02/10/2021 in [place] at [place]  PHQ-2 Total Score 0 0 0 2 0  PHQ-9 Total Score 0 -- 3 4 --      Flowsheet Row ED from 12/15/2021 in [place] No Risk        Review of Systems:  Review of Systems  Musculoskeletal:  Negative for gait problem.  Neurological:  Negative for tremors.  Psychiatric/Behavioral:         Please refer to HPI    Medications: I have reviewed the patient's current medications.  Current Outpatient Medications  Medication Sig Dispense Refill   brexpiprazole (REXULTI) 1 MG TABS tablet Take one and 1/2 tablets daily. 45 tablet 5   citalopram (CELEXA) 40 MG tablet Take 1 tablet (40 mg total) by mouth daily. 90 tablet 3   clonazePAM (KLONOPIN) 0.5 MG tablet Take 1 tablet (0.5 mg total) by mouth 2 (two) times daily. 60 tablet 2   Dulaglutide (TRULICITY) 4.5 IF/0.2DX SOPN 4.80m     Insulin Glargine (BASAGLAR KWIKPEN) 100 UNIT/ML Give [place] daily- current dose 46 units.  Estimate max dose 50 units (Patient taking differently: Give [place] daily- current dose 50 units Am 70 units PM.) 15 mL 5   Insulin Pen Needle (BD PEN NEEDLE NANO 2ND GEN) 32G X 4 MM MISC USE AS DIRECTED 100 each 3   Loratadine 10 MG CAPS Take by mouth.     Multiple Vitamins-Minerals (MULTIVITAMIN WITH MINERALS) tablet Take 1 tablet by mouth daily.     nitrofurantoin, macrocrystal-monohydrate, (MACROBID) 100 MG  capsule Take 1 capsule (100 mg total) by mouth 2 (two) times daily. 14 capsule 0   ONETOUCH VERIO test strip USE AS INSTRUCTED- MAY CHECK BLOOD SUGAR UP TO TWICE A DAY. 100 strip 12   pantoprazole (PROTONIX) 40 MG tablet TAKE 1 TABLET BY MOUTH EVERY DAY 90 tablet 3   pioglitazone (ACTOS) 30 MG tablet TAKE 1 TABLET BY MOUTH EVERY DAY 90 tablet 1   rosuvastatin (CRESTOR) 40 MG tablet TAKE 1 TABLET BY MOUTH EVERY DAY 90 tablet 0   No current facility-administered medications  for this visit.    Medication Side Effects: None  Allergies:  Allergies  Allergen Reactions   Buspar [Buspirone] Other (See Comments)    Lip swelling and rash numbness    Augmentin [Amoxicillin-Pot Clavulanate] Nausea And Vomiting   Biaxin [Clarithromycin]    Ceftin [Cefuroxime] Swelling    Eye swelling, shortness of breath    Ciprofloxacin     Developed itching with IV form in the hospital, but has since taken PO without a problem   Dapagliflozin Other (See Comments)    Burning upon urination. Increased blood sugar.   Doxycycline Itching   Levaquin [Levofloxacin] Hives   Metformin And Related Nausea And Vomiting   Phenazopyridine Hcl Other (See Comments)    Does not remember reaction   Pyridium [Phenazopyridine Hcl]     Does not remember reaction   Sulfa Antibiotics     As a younger person had itching, but has taken more recently and did ok   Latex Rash   Lorabid [Loracarbef] Rash    Past Medical History:  Diagnosis Date   Anxiety    Cancer ([place])    skin- Right shin squamous cell   Depression    Diabetes mellitus    Diabetes mellitus, type II ([place])    GERD (gastroesophageal reflux disease)    Hematoma    on buttock   Hyperlipidemia    Hypertension    Migraine    Seasonal allergies     Past Medical History, Surgical history, Social history, and Family history were reviewed and updated as appropriate.   Please see review of systems for further details on the patient's review from today.   Objective:   Physical Exam:  There were no vitals taken for this visit.  Physical Exam Constitutional:      General: She is not in acute distress. Musculoskeletal:        General: No deformity.  Neurological:     Mental Status: She is alert and oriented to person, place, and time.     Coordination: Coordination normal.  Psychiatric:        Attention and Perception: Attention and perception normal. She does not perceive auditory or visual hallucinations.        Mood  and Affect: Mood normal. Mood is not anxious or depressed. Affect is not labile, blunt, angry or inappropriate.        Speech: Speech normal.        Behavior: Behavior normal.        Thought Content: Thought content normal. Thought content is not paranoid or delusional. Thought content does not include homicidal or suicidal ideation. Thought content does not include homicidal or suicidal plan.        Cognition and Memory: Cognition and memory normal.        Judgment: Judgment normal.     Comments: Insight intact     Lab Review:     Component Value Date/Time   NA  135 01/03/2022 1515   K 4.2 01/03/2022 1515   CL 99 01/03/2022 1515   CO2 25 01/03/2022 1515   GLUCOSE 468 (H) 01/03/2022 1515   BUN 11 01/03/2022 1515   CREATININE 0.93 01/03/2022 1515   CREATININE 0.81 07/06/2020 1128   CALCIUM 9.0 01/03/2022 1515   PROT 6.3 01/03/2022 1515   ALBUMIN 3.9 01/03/2022 1515   AST 74 (H) 01/03/2022 1515   ALT 61 (H) 01/03/2022 1515   ALKPHOS 121 (H) 01/03/2022 1515   BILITOT 0.4 01/03/2022 1515   GFRNONAA >60 12/15/2021 1245   GFRNONAA >89 04/08/2016 1426   GFRAA >60 04/02/2020 1243   GFRAA >89 04/08/2016 1426       Component Value Date/Time   WBC 7.5 12/15/2021 1245   WBC 7.6 12/15/2021 1245   RBC 5.31 (H) 12/15/2021 1245   RBC 5.32 (H) 12/15/2021 1245   HGB 15.0 12/15/2021 1321   HCT 44.0 12/15/2021 1321   PLT 315 12/15/2021 1245   PLT 323 12/15/2021 1245   MCV 82.5 12/15/2021 1245   MCV 82.5 12/15/2021 1245   MCV 78.8 (A) 03/29/2015 0938   MCH 28.4 12/15/2021 1245   MCH 28.4 12/15/2021 1245   MCHC 34.5 12/15/2021 1245   MCHC 34.4 12/15/2021 1245   RDW 13.4 12/15/2021 1245   RDW 13.6 12/15/2021 1245   LYMPHSABS 2.7 12/15/2021 1245   MONOABS 0.5 12/15/2021 1245   EOSABS 0.1 12/15/2021 1245   BASOSABS 0.0 12/15/2021 1245    No results found for: "POCLITH", "LITHIUM"   No results found for: "PHENYTOIN", "PHENOBARB", "VALPROATE", "CBMZ"   .res Assessment: Plan:     Plan:  PDMP reviewed  1. Celexa 60m daily 2. Clonazepam 0.574mBID 3. Decrease Rexulti 1.64m58mo 1mg10mily  4. Add Wellbutrin XL 150mg64mly x 7 days, then increase to two tablets daily - denies seizure history.  RTC 3 months  Patient advised to contact office with any questions, adverse effects, or acute worsening in signs and symptoms.  Discussed potential benefits, risk, and side effects of benzodiazepines to include potential risk of tolerance and dependence, as well as possible drowsiness.  Advised patient not to drive if experiencing drowsiness and to take lowest possible effective dose to minimize risk of dependence and tolerance.  Discussed potential metabolic side effects associated with atypical antipsychotics, as well as potential risk for movement side effects. Advised pt to contact office if movement side effects occur.  There are no diagnoses linked to this encounter.   Please see After Visit Summary for patient specific instructions.  No future appointments.  No orders of the defined types were placed in this encounter.   -------------------------------

## 2022-05-07 ENCOUNTER — Other Ambulatory Visit: Payer: Self-pay | Admitting: Adult Health

## 2022-05-07 DIAGNOSIS — F331 Major depressive disorder, recurrent, moderate: Secondary | ICD-10-CM

## 2022-05-23 ENCOUNTER — Ambulatory Visit: Payer: BC Managed Care – PPO | Admitting: Adult Health

## 2022-05-31 ENCOUNTER — Encounter: Payer: Self-pay | Admitting: Adult Health

## 2022-05-31 ENCOUNTER — Ambulatory Visit: Payer: BC Managed Care – PPO | Admitting: Adult Health

## 2022-05-31 DIAGNOSIS — F411 Generalized anxiety disorder: Secondary | ICD-10-CM

## 2022-05-31 DIAGNOSIS — F428 Other obsessive-compulsive disorder: Secondary | ICD-10-CM | POA: Diagnosis not present

## 2022-05-31 DIAGNOSIS — F410 Panic disorder [episodic paroxysmal anxiety] without agoraphobia: Secondary | ICD-10-CM

## 2022-05-31 DIAGNOSIS — F331 Major depressive disorder, recurrent, moderate: Secondary | ICD-10-CM

## 2022-05-31 MED ORDER — CLONAZEPAM 0.5 MG PO TABS: 0.5000 mg | ORAL_TABLET | Freq: Two times a day (BID) | ORAL | 2 refills | Status: DC

## 2022-05-31 MED ORDER — BREXPIPRAZOLE 2 MG PO TABS: ORAL_TABLET | ORAL | 2 refills | Status: DC

## 2022-05-31 NOTE — Progress Notes (Signed)
Natalie Richard [id number] 09/28/1966 56 y.o.  Subjective:   Patient ID:  Natalie Richard is a 56 y.o. (DOB August 26, 1966) female.  Chief Complaint: No chief complaint on file.   HPI Natalie Richard presents to the office today for follow-up of MDD, GAD, obsesional thoughts, panic disorder.  Describes mood today as "ok". Pleasant. Decreased tearfulness. Mood symptoms - reports depression - "no energy" - "no feeling at all". Feels "apathetic". Reports decreased anxiety and irritability. Denies worry and rumination. Mood is lower. Stating "I'm still feeling about the same". Tried the Wellbutrin and did not feel it was helpful. Wondering if she would feel better if she had something to do - considering a part time job. Reporting self care deficits - bathing twice a week. Feels like she is still having to push herself to do things. Reporting some ongoing grief with loss of dogs. She and new puppy bonding. Husband working as a Building control surveyor - awaiting surgery for rotator cuff repair. Working with endocrinologist and her A1C - 9  - has been unable to get Trulicity. Varying interest and motivation. Taking medications as prescribed.  Energy levels lower. Active, does not have a regular exercise routine.  Enjoys some usual interests and activities. Married. Lives with husband - 1 cat - 1 dog. Spending time with family. Appetite decreased. Weight stable 230 pounds. Sleeps well most nights. Averages 8 or more hours. Focus and concentration difficulties. Completing tasks. Managing aspects of household. Not working currently. Denies SI or HI.  Denies AH or VH.  Previously seen by Dr. Tory Emerald  Previous medication trials: Abilify, Gabapentin, Shary Decamp, Effexor    PHQ2-9    [place] [facility] [place] Visit from 12/06/2021 in [place] [facility] [place] Visit from 09/02/2021 in [place] at [place] Visit from 05/10/2021 in [place] Visit from 02/10/2021 in [place] at [place]  PHQ-2 Total Score 0 0 0 2 0  PHQ-9 Total Score 0 -- 3 4 --      Flowsheet Row ED from 12/15/2021 in [place] No Risk        Review of Systems:  Review of Systems  Musculoskeletal:  Negative for gait problem.  Neurological:  Negative for tremors.  Psychiatric/Behavioral:         Please refer to HPI    Medications: I have reviewed the patient's current medications.  Current Outpatient Medications  Medication Sig Dispense Refill   brexpiprazole (REXULTI) 2 MG TABS tablet Take one tablet daily. 30 tablet 2   buPROPion (WELLBUTRIN XL) 150 MG 24 hr tablet Take one tablet every morning for 7 days, then increase to two tablets. 60 tablet 2   citalopram (CELEXA) 40 MG tablet Take 1 tablet (40 mg total) by mouth daily. 90 tablet 3   clonazePAM (KLONOPIN) 0.5 MG tablet Take 1 tablet (0.5 mg total) by mouth 2 (two) times daily. 60 tablet 2   Dulaglutide (TRULICITY) 4.5 AT/5.5DD SOPN 4.65m     Insulin Glargine (BASAGLAR KWIKPEN) 100 UNIT/ML Give [place] daily- current dose 46 units.  Estimate max dose 50 units (Patient taking differently: Give [place] daily- current dose 50 units Am 70 units PM.) 15 mL 5   Insulin Pen Needle (BD PEN NEEDLE NANO 2ND GEN) 32G X 4 MM MISC  USE AS DIRECTED 100 each 3   Loratadine 10 MG CAPS Take by mouth.     Multiple Vitamins-Minerals (MULTIVITAMIN WITH MINERALS) tablet Take 1 tablet by mouth daily.     nitrofurantoin, macrocrystal-monohydrate, (MACROBID) 100 MG capsule Take 1 capsule (100 mg total) by mouth 2 (two) times daily. 14 capsule 0   ONETOUCH VERIO test strip USE AS INSTRUCTED- MAY CHECK BLOOD SUGAR UP TO TWICE A DAY. 100 strip 12   pantoprazole (PROTONIX) 40 MG tablet TAKE 1 TABLET BY MOUTH EVERY DAY 90 tablet  3   pioglitazone (ACTOS) 30 MG tablet TAKE 1 TABLET BY MOUTH EVERY DAY 90 tablet 1   rosuvastatin (CRESTOR) 40 MG tablet TAKE 1 TABLET BY MOUTH EVERY DAY 90 tablet 0   No current facility-administered medications for this visit.    Medication Side Effects: None  Allergies:  Allergies  Allergen Reactions   Buspar [Buspirone] Other (See Comments)    Lip swelling and rash numbness    Augmentin [Amoxicillin-Pot Clavulanate] Nausea And Vomiting   Biaxin [Clarithromycin]    Ceftin [Cefuroxime] Swelling    Eye swelling, shortness of breath    Ciprofloxacin     Developed itching with IV form in the hospital, but has since taken PO without a problem   Dapagliflozin Other (See Comments)    Burning upon urination. Increased blood sugar.   Doxycycline Itching   Levaquin [Levofloxacin] Hives   Metformin And Related Nausea And Vomiting   Phenazopyridine Hcl Other (See Comments)    Does not remember reaction   Pyridium [Phenazopyridine Hcl]     Does not remember reaction   Sulfa Antibiotics     As a younger person had itching, but has taken more recently and did ok   Latex Rash   Lorabid [Loracarbef] Rash    Past Medical History:  Diagnosis Date   Anxiety    Cancer ([place])    skin- Right shin squamous cell   Depression    Diabetes mellitus    Diabetes mellitus, type II ([place])    GERD (gastroesophageal reflux disease)    Hematoma    on buttock   Hyperlipidemia    Hypertension    Migraine    Seasonal allergies     Past Medical History, Surgical history, Social history, and Family history were reviewed and updated as appropriate.   Please see review of systems for further details on the patient's review from today.   Objective:   Physical Exam:  There were no vitals taken for this visit.  Physical Exam Constitutional:      General: She is not in acute distress. Musculoskeletal:        General: No deformity.  Neurological:     Mental Status: She is alert and oriented to  person, place, and time.     Coordination: Coordination normal.  Psychiatric:        Attention and Perception: Attention and perception normal. She does not perceive auditory or visual hallucinations.        Mood and Affect: Mood normal. Mood is not anxious or depressed. Affect is not labile, blunt, angry or inappropriate.        Speech: Speech normal.        Behavior: Behavior normal.        Thought Content: Thought content normal. Thought content is not paranoid or delusional. Thought content does not include homicidal or suicidal ideation. Thought content does not include homicidal or suicidal plan.  Cognition and Memory: Cognition and memory normal.        Judgment: Judgment normal.     Comments: Insight intact     Lab Review:     Component Value Date/Time   NA 135 01/03/2022 1515   K 4.2 01/03/2022 1515   CL 99 01/03/2022 1515   CO2 25 01/03/2022 1515   GLUCOSE 468 (H) 01/03/2022 1515   BUN 11 01/03/2022 1515   CREATININE 0.93 01/03/2022 1515   CREATININE 0.81 07/06/2020 1128   CALCIUM 9.0 01/03/2022 1515   PROT 6.3 01/03/2022 1515   ALBUMIN 3.9 01/03/2022 1515   AST 74 (H) 01/03/2022 1515   ALT 61 (H) 01/03/2022 1515   ALKPHOS 121 (H) 01/03/2022 1515   BILITOT 0.4 01/03/2022 1515   GFRNONAA >60 12/15/2021 1245   GFRNONAA >89 04/08/2016 1426   GFRAA >60 04/02/2020 1243   GFRAA >89 04/08/2016 1426       Component Value Date/Time   WBC 7.5 12/15/2021 1245   WBC 7.6 12/15/2021 1245   RBC 5.31 (H) 12/15/2021 1245   RBC 5.32 (H) 12/15/2021 1245   HGB 15.0 12/15/2021 1321   HCT 44.0 12/15/2021 1321   PLT 315 12/15/2021 1245   PLT 323 12/15/2021 1245   MCV 82.5 12/15/2021 1245   MCV 82.5 12/15/2021 1245   MCV 78.8 (A) 03/29/2015 0938   MCH 28.4 12/15/2021 1245   MCH 28.4 12/15/2021 1245   MCHC 34.5 12/15/2021 1245   MCHC 34.4 12/15/2021 1245   RDW 13.4 12/15/2021 1245   RDW 13.6 12/15/2021 1245   LYMPHSABS 2.7 12/15/2021 1245   MONOABS 0.5 12/15/2021 1245    EOSABS 0.1 12/15/2021 1245   BASOSABS 0.0 12/15/2021 1245    No results found for: "POCLITH", "LITHIUM"   No results found for: "PHENYTOIN", "PHENOBARB", "VALPROATE", "CBMZ"   .res Assessment: Plan:    Plan:  PDMP reviewed  1. Celexa 63m daily 2. Clonazepam 0.565mBID 3. Increase Rexulti 76m72mo 2mg43mily   RTC 4 weeks.  Patient advised to contact office with any questions, adverse effects, or acute worsening in signs and symptoms.  Discussed potential benefits, risk, and side effects of benzodiazepines to include potential risk of tolerance and dependence, as well as possible drowsiness.  Advised patient not to drive if experiencing drowsiness and to take lowest possible effective dose to minimize risk of dependence and tolerance.  Discussed potential metabolic side effects associated with atypical antipsychotics, as well as potential risk for movement side effects. Advised pt to contact office if movement side effects occur.   Diagnoses and all orders for this visit:  Generalized anxiety disorder  Major depressive disorder, recurrent episode, moderate (HCC) -     brexpiprazole (REXULTI) 2 MG TABS tablet; Take one tablet daily.  Panic disorder -     clonazePAM (KLONOPIN) 0.5 MG tablet; Take 1 tablet (0.5 mg total) by mouth 2 (two) times daily.  Obsessional thoughts     Please see After Visit Summary for patient specific instructions.  No future appointments.  No orders of the defined types were placed in this encounter.   -------------------------------

## 2022-06-17 NOTE — Progress Notes (Unsigned)
[place] at [facility] [address] [place], [place] [address] [phone]  Date:  06/20/2022   Name:  Natalie Richard   DOB:  [birth year]   MRN:  [id number]  PCP:  Darreld Mclean, MD    Chief Complaint: No chief complaint on file.   History of Present Illness:  Natalie Richard is a 56 y.o. very pleasant female patient who presents with the following:  Pt seen today with concern of fatigue  Most recent visit with myself was in May of this year  History of NASH, diabetes, HTN, hyperlipidemia  She is now under endocrinology care   Lab Results  Component Value Date   HGBA1C 12.5 (H) 12/06/2021     Patient Active Problem List   Diagnosis Date Noted   History of squamous cell carcinoma in situ (SCCIS) of skin 12/11/2019   NASH (nonalcoholic steatohepatitis) 07/26/2015   BMI 39.0-39.9,adult 03/19/2012   Hematoma 03/19/2012   HTN (hypertension), benign 01/09/2012   Osteopenia 01/09/2012   Migraines 01/09/2012   Insomnia 01/09/2012   Anxiety and depression 01/09/2012   Hyperlipidemia 01/09/2012   Diabetes mellitus 01/09/2012   Allergic rhinitis 01/09/2012    Past Medical History:  Diagnosis Date   Anxiety    Cancer ([place])    skin- Right shin squamous cell   Depression    Diabetes mellitus    Diabetes mellitus, type II ([place])    GERD (gastroesophageal reflux disease)    Hematoma    on buttock   Hyperlipidemia    Hypertension    Migraine    Seasonal allergies     Past Surgical History:  Procedure Laterality Date   ABDOMINAL HYSTERECTOMY  2004   [facility]   EYE SURGERY     INNER EAR SURGERY Left 08/2016    Social History   Tobacco Use   Smoking status: Never   Smokeless tobacco: Never  Vaping Use   Vaping Use: Never used  Substance Use Topics   Alcohol use: Not Currently    Alcohol/week: 8.0 - 10.0 standard drinks of alcohol    Types: 6 Cans of beer, 2 Shots of liquor per week   Drug use: No     Family History  Problem Relation Age of Onset   Cancer Mother 41       colon   Diabetes Mother    Rheum arthritis Mother    Dementia Mother    Depression Mother    Cancer Father 63       bone and prostate   Cancer Maternal Grandmother        breast   Diabetes Maternal Grandfather    Depression Cousin     Allergies  Allergen Reactions   Buspar [Buspirone] Other (See Comments)    Lip swelling and rash numbness    Augmentin [Amoxicillin-Pot Clavulanate] Nausea And Vomiting   Biaxin [Clarithromycin]    Ceftin [Cefuroxime] Swelling    Eye swelling, shortness of breath    Ciprofloxacin     Developed itching with IV form in the hospital, but has since taken PO without a problem   Dapagliflozin Other (See Comments)    Burning upon urination. Increased blood sugar.   Doxycycline Itching   Levaquin [Levofloxacin] Hives   Metformin And Related Nausea And Vomiting   Phenazopyridine Hcl Other (See Comments)    Does not remember reaction   Pyridium [Phenazopyridine Hcl]     Does not remember reaction  Sulfa Antibiotics     As a younger person had itching, but has taken more recently and did ok   Latex Rash   Lorabid [Loracarbef] Rash    Medication list has been reviewed and updated.  Current Outpatient Medications on File Prior to Visit  Medication Sig Dispense Refill   brexpiprazole (REXULTI) 2 MG TABS tablet Take one tablet daily. 30 tablet 2   buPROPion (WELLBUTRIN XL) 150 MG 24 hr tablet Take one tablet every morning for 7 days, then increase to two tablets. 60 tablet 2   citalopram (CELEXA) 40 MG tablet Take 1 tablet (40 mg total) by mouth daily. 90 tablet 3   clonazePAM (KLONOPIN) 0.5 MG tablet Take 1 tablet (0.5 mg total) by mouth 2 (two) times daily. 60 tablet 2   Dulaglutide (TRULICITY) 4.5 NV/9.58YO SOPN 4.40m     Insulin Glargine (BASAGLAR KWIKPEN) 100 UNIT/ML Give [place] daily- current dose 46 units.  Estimate max dose 50 units (Patient taking differently: Give [place]  daily- current dose 50 units Am 70 units PM.) 15 mL 5   Insulin Pen Needle (BD PEN NEEDLE NANO 2ND GEN) 32G X 4 MM MISC USE AS DIRECTED 100 each 3   Loratadine 10 MG CAPS Take by mouth.     Multiple Vitamins-Minerals (MULTIVITAMIN WITH MINERALS) tablet Take 1 tablet by mouth daily.     nitrofurantoin, macrocrystal-monohydrate, (MACROBID) 100 MG capsule Take 1 capsule (100 mg total) by mouth 2 (two) times daily. 14 capsule 0   ONETOUCH VERIO test strip USE AS INSTRUCTED- MAY CHECK BLOOD SUGAR UP TO TWICE A DAY. 100 strip 12   pantoprazole (PROTONIX) 40 MG tablet TAKE 1 TABLET BY MOUTH EVERY DAY 90 tablet 3   pioglitazone (ACTOS) 30 MG tablet TAKE 1 TABLET BY MOUTH EVERY DAY 90 tablet 1   rosuvastatin (CRESTOR) 40 MG tablet TAKE 1 TABLET BY MOUTH EVERY DAY 90 tablet 0   No current facility-administered medications on file prior to visit.    Review of Systems:  As per HPI- otherwise negative.   Physical Examination: There were no vitals filed for this visit. There were no vitals filed for this visit. There is no height or weight on file to calculate BMI. Ideal Body Weight:    GEN: no acute distress. HEENT: Atraumatic, Normocephalic.  Ears and Nose: No external deformity. CV: RRR, No M/G/R. No JVD. No thrill. No extra heart sounds. PULM: CTA B, no wheezes, crackles, rhonchi. No retractions. No resp. distress. No accessory muscle use. ABD: S, NT, ND, +BS. No rebound. No HSM. EXTR: No c/c/e PSYCH: Normally interactive. Conversant.    Assessment and Plan: ***  Signed JLamar Blinks MD

## 2022-06-20 ENCOUNTER — Encounter (HOSPITAL_BASED_OUTPATIENT_CLINIC_OR_DEPARTMENT_OTHER): Payer: Self-pay | Admitting: Emergency Medicine

## 2022-06-20 ENCOUNTER — Other Ambulatory Visit: Payer: Self-pay

## 2022-06-20 ENCOUNTER — Emergency Department (HOSPITAL_BASED_OUTPATIENT_CLINIC_OR_DEPARTMENT_OTHER)
Admission: EM | Admit: 2022-06-20 | Discharge: 2022-06-20 | Disposition: A | Discharge status: Home / Self Care | Payer: BC Managed Care – PPO | Attending: Emergency Medicine | Admitting: Emergency Medicine

## 2022-06-20 ENCOUNTER — Ambulatory Visit: Payer: BC Managed Care – PPO | Admitting: Family Medicine

## 2022-06-20 VITALS — BP 102/60 | HR 101 | Temp 97.7°F | Resp 18 | Ht 66.0 in | Wt 228.0 lb

## 2022-06-20 DIAGNOSIS — E782 Mixed hyperlipidemia: Secondary | ICD-10-CM

## 2022-06-20 DIAGNOSIS — I10 Essential (primary) hypertension: Secondary | ICD-10-CM | POA: Diagnosis not present

## 2022-06-20 DIAGNOSIS — Z794 Long term (current) use of insulin: Secondary | ICD-10-CM | POA: Insufficient documentation

## 2022-06-20 DIAGNOSIS — E1165 Type 2 diabetes mellitus with hyperglycemia: Secondary | ICD-10-CM

## 2022-06-20 DIAGNOSIS — R739 Hyperglycemia, unspecified: Secondary | ICD-10-CM

## 2022-06-20 DIAGNOSIS — Z859 Personal history of malignant neoplasm, unspecified: Secondary | ICD-10-CM | POA: Diagnosis not present

## 2022-06-20 DIAGNOSIS — Z91040 Latex allergy status: Secondary | ICD-10-CM | POA: Diagnosis not present

## 2022-06-20 LAB — URINALYSIS, MICROSCOPIC (REFLEX)
RBC / HPF: NONE SEEN RBC/hpf (ref 0–5)
WBC, UA: NONE SEEN WBC/hpf (ref 0–5)

## 2022-06-20 LAB — GLUCOSE, POCT (MANUAL RESULT ENTRY): POC Glucose: 528 mg/dl — AB (ref 70–99)

## 2022-06-20 LAB — BASIC METABOLIC PANEL
Anion gap: 12 (ref 5–15)
BUN: 11 mg/dL (ref 6–20)
CO2: 20 mmol/L — ABNORMAL LOW (ref 22–32)
Calcium: 8.7 mg/dL — ABNORMAL LOW (ref 8.9–10.3)
Chloride: 101 mmol/L (ref 98–111)
Creatinine, Ser: 0.91 mg/dL (ref 0.44–1.00)
GFR, Estimated: >60 mL/min (ref >60)
Glucose, Bld: 499 mg/dL — ABNORMAL HIGH (ref 70–99)
Potassium: 3.4 mmol/L — ABNORMAL LOW (ref 3.5–5.1)
Sodium: 133 mmol/L — ABNORMAL LOW (ref 135–145)

## 2022-06-20 LAB — CBG MONITORING, ED
Glucose-Capillary: 502 mg/dL (ref 70–99)
Glucose-Capillary: 383 mg/dL — ABNORMAL HIGH (ref 70–99)
Glucose-Capillary: 373 mg/dL — ABNORMAL HIGH (ref 70–99)

## 2022-06-20 LAB — CBC WITH DIFFERENTIAL/PLATELET
Abs Immature Granulocytes: 0.07 10*3/uL (ref 0.00–0.07)
Basophils Absolute: 0 10*3/uL (ref 0.0–0.1)
Basophils Relative: 0 %
Eosinophils Absolute: 0 10*3/uL (ref 0.0–0.5)
Eosinophils Relative: 0 %
HCT: 45.2 % (ref 36.0–46.0)
Hemoglobin: 14.8 g/dL (ref 12.0–15.0)
Immature Granulocytes: 1 %
Lymphocytes Relative: 29 %
Lymphs Abs: 2 10*3/uL (ref 0.7–4.0)
MCH: 27.5 pg (ref 26.0–34.0)
MCHC: 32.7 g/dL (ref 30.0–36.0)
MCV: 84 fL (ref 80.0–100.0)
Monocytes Absolute: 0.5 10*3/uL (ref 0.1–1.0)
Monocytes Relative: 7 %
Neutro Abs: 4.4 10*3/uL (ref 1.7–7.7)
Neutrophils Relative %: 63 %
Platelets: 283 10*3/uL (ref 150–400)
RBC: 5.38 MIL/uL — ABNORMAL HIGH (ref 3.87–5.11)
RDW: 13.8 % (ref 11.5–15.5)
WBC: 7.1 10*3/uL (ref 4.0–10.5)
nRBC: 0 % (ref 0.0–0.2)

## 2022-06-20 LAB — BETA-HYDROXYBUTYRIC ACID: Beta-Hydroxybutyric Acid: 0.31 mmol/L — ABNORMAL HIGH (ref 0.05–0.27)

## 2022-06-20 LAB — URINALYSIS, ROUTINE W REFLEX MICROSCOPIC
Bilirubin Urine: NEGATIVE
Glucose, UA: >=500 mg/dL — AB
Hgb urine dipstick: NEGATIVE
Ketones, ur: 15 mg/dL — AB
Leukocytes,Ua: NEGATIVE
Nitrite: NEGATIVE
Protein, ur: NEGATIVE mg/dL
Specific Gravity, Urine: 1.01 (ref 1.005–1.030)
pH: 5.5 (ref 5.0–8.0)

## 2022-06-20 MED ORDER — INSULIN ASPART 100 UNIT/ML IJ SOLN: 20.0000 [IU] | Freq: Once | INTRAMUSCULAR | Status: DC

## 2022-06-20 MED ORDER — LACTATED RINGERS IV BOLUS
2000.0000 mL | Freq: Once | INTRAVENOUS | Status: AC
  Administered 2022-06-20: 2000 mL via INTRAVENOUS

## 2022-06-20 MED ORDER — INSULIN ASPART 100 UNIT/ML IJ SOLN
20.0000 [IU] | Freq: Once | INTRAMUSCULAR | Status: AC
  Administered 2022-06-20: 20 [IU] via SUBCUTANEOUS

## 2022-06-20 MED ORDER — POTASSIUM CHLORIDE CRYS ER 20 MEQ PO TBCR
40.0000 meq | EXTENDED_RELEASE_TABLET | Freq: Once | ORAL | Status: AC
  Administered 2022-06-20: 40 meq via ORAL
  Filled 2022-06-20: qty 2

## 2022-06-20 MED ORDER — INSULIN REGULAR HUMAN 100 UNIT/ML IJ SOLN: 20.0000 [IU] | Freq: Once | INTRAMUSCULAR | Status: DC

## 2022-06-20 NOTE — ED Notes (Signed)
ED Provider at bedside. 

## 2022-06-20 NOTE — ED Provider Notes (Cosign Needed Addendum)
[facility] EMERGENCY DEPARTMENT Provider Note   CSN: [id number] Arrival date & time: 06/20/22  1428     History  Chief Complaint  Patient presents with   Hyperglycemia    Natalie Richard is a 56 y.o. female.  HPI 56 year old female with a history of poorly controlled diabetes, cancer, hyperlipidemia, migraines, anxiety, GERD, hypertension presents to the ER with concerns for elevated blood sugars.  Patient went to her PCP and was noted to have a sugar of 530.  Denies any abdominal pain, nausea or vomiting.  Denies any dysuria or hematuria.  No abdominal pain.  She reports that she is followed by endocrinology for her diabetes, and has not been getting enough of her scheduled Basaglar.  He is currently taking 70 units of Basaglar in the evenings, but is supposed to also take 50 units in the a.m.  She has not been getting this secondary lack of supply.  She was also out of Trulicity temporarily again secondary to supply issues but is now back on it.  She was off of it for about a week or 2.  She has felt fatigued and tired but no other symptoms.  He has not been checking her sugars at home.    Home Medications Prior to Admission medications   Medication Sig Start Date End Date Taking? Authorizing Provider  brexpiprazole (REXULTI) 2 MG TABS tablet Take one tablet daily. 05/31/22  Yes Mozingo, Berdie Ogren, NP  citalopram (CELEXA) 40 MG tablet Take 1 tablet (40 mg total) by mouth daily. 03/18/22  Yes Mozingo, Berdie Ogren, NP  clonazePAM (KLONOPIN) 0.5 MG tablet Take 1 tablet (0.5 mg total) by mouth 2 (two) times daily. 05/31/22  Yes Mozingo, Berdie Ogren, NP  Dulaglutide (TRULICITY) 4.5 XE/9.4MH SOPN 4.43m 01/19/22  Yes [provider]  Insulin Glargine (BASAGLAR KWIKPEN) 100 UNIT/ML Give [place] daily- current dose 46 units.  Estimate max dose 50 units Patient taking differently: Give [place] daily- current dose 50 units Am 70 units PM. 12/23/21  Yes Copland, JGay Filler MD   Insulin Pen Needle (BD PEN NEEDLE NANO 2ND GEN) 32G X 4 MM MISC USE AS DIRECTED 05/10/21  Yes Copland, JGay Filler MD  Multiple Vitamins-Minerals (MULTIVITAMIN WITH MINERALS) tablet Take 1 tablet by mouth daily.   Yes [provider]  ONETOUCH VERIO test strip USE AS INSTRUCTED- MAY CHECK BLOOD SUGAR UP TO TWICE A DAY. 01/05/22  Yes Copland, JGay Filler MD  pantoprazole (PROTONIX) 40 MG tablet TAKE 1 TABLET BY MOUTH EVERY DAY 02/14/22  Yes Copland, JGay Filler MD  Loratadine 10 MG CAPS Take by mouth.    [provider]  nitrofurantoin, macrocrystal-monohydrate, (MACROBID) 100 MG capsule Take 1 capsule (100 mg total) by mouth 2 (two) times daily. 04/21/22   MMarrian Salvage FNP  pioglitazone (ACTOS) 30 MG tablet TAKE 1 TABLET BY MOUTH EVERY DAY 10/04/21   Copland, JGay Filler MD  rosuvastatin (CRESTOR) 40 MG tablet TAKE 1 TABLET BY MOUTH EVERY DAY 11/08/21   Copland, JGay Filler MD      Allergies    Buspar [buspirone], Augmentin [amoxicillin-pot clavulanate], Biaxin [clarithromycin], Ceftin [cefuroxime], Ciprofloxacin, Dapagliflozin, Doxycycline, Levaquin [levofloxacin], Metformin and related, Phenazopyridine hcl, Pyridium [phenazopyridine hcl], Sulfa antibiotics, Latex, and Lorabid [loracarbef]    Review of Systems   Review of Systems Ten systems reviewed and are negative for acute change, except as noted in the HPI.   Physical Exam Updated Vital Signs BP 125/75 (BP Location: Left Arm)   Pulse 89  Temp 98.1 F (36.7 C) (Oral)   Resp 18   Ht 5' 6"  (1.676 m)   Wt 103.4 kg   SpO2 100%   BMI 36.80 kg/m  Physical Exam Vitals and nursing note reviewed.  Constitutional:      General: She is not in acute distress.    Appearance: She is well-developed.  HENT:     Head: Normocephalic and atraumatic.  Eyes:     Conjunctiva/sclera: Conjunctivae normal.  Cardiovascular:     Rate and Rhythm: Normal rate and regular rhythm.     Heart sounds: No murmur heard. Pulmonary:      Effort: Pulmonary effort is normal. No respiratory distress.     Breath sounds: Normal breath sounds.  Abdominal:     Palpations: Abdomen is soft.     Tenderness: There is no abdominal tenderness.  Musculoskeletal:        General: No swelling.     Cervical back: Neck supple.  Skin:    General: Skin is warm and dry.     Capillary Refill: Capillary refill takes less than 2 seconds.  Neurological:     Mental Status: She is alert.  Psychiatric:        Mood and Affect: Mood normal.     ED Results / Procedures / Treatments   Labs (all labs ordered are listed, but only abnormal results are displayed) Labs Reviewed  CBC WITH DIFFERENTIAL/PLATELET - Abnormal; Notable for the following components:      Result Value   RBC 5.38 (*)    All other components within normal limits  BASIC METABOLIC PANEL - Abnormal; Notable for the following components:   Sodium 133 (*)    Potassium 3.4 (*)    CO2 20 (*)    Glucose, Bld 499 (*)    Calcium 8.7 (*)    All other components within normal limits  URINALYSIS, ROUTINE W REFLEX MICROSCOPIC - Abnormal; Notable for the following components:   Color, Urine STRAW (*)    Glucose, UA >=500 (*)    Ketones, ur 15 (*)    All other components within normal limits  URINALYSIS, MICROSCOPIC (REFLEX) - Abnormal; Notable for the following components:   Bacteria, UA RARE (*)    All other components within normal limits  CBG MONITORING, ED - Abnormal; Notable for the following components:   Glucose-Capillary 502 (*)    All other components within normal limits  CBG MONITORING, ED - Abnormal; Notable for the following components:   Glucose-Capillary 383 (*)    All other components within normal limits  BETA-HYDROXYBUTYRIC ACID    EKG None  Radiology No results found.  Procedures Procedures    Medications Ordered in ED Medications  lactated ringers bolus 2,000 mL (0 mLs Intravenous Stopped 06/20/22 1604)  potassium chloride SA (KLOR-CON M) CR tablet  40 mEq (40 mEq Oral Given 06/20/22 1548)  insulin aspart (novoLOG) injection 20 Units (20 Units Subcutaneous Given 06/20/22 1701)    ED Course/ Medical Decision Making/ A&P                           Medical Decision Making Amount and/or Complexity of Data Reviewed Labs: ordered.  Risk Prescription drug management.  56 year old female presenting with hyperglycemia.  Has had issues with her medications due to supply.  She was noted to be hyperglycemic at her PCPs.  Her vitals overall reassuring.  She is otherwise well-appearing.  She has no abdominal pain, no  urinary symptoms,  cough, nasal congestion, URI type symptoms.   DDx includes HHS/DKA, hyperglycemia, noncompliance  Labs ordered, reviewed and interpreted by me.  Mild hyponatremia and hypokalemia noted, CBG 499 and CO2 of 20 but normal anion gap.  Not consistent with DKA.  CBC without leukocytosis.  Patient received 2 L of fluids, repeat CBG 383.  Given an additional 20 units of insulin.  Suspect her hyperglycemia is somewhat chronic as she does not take her blood sugars at home. Instructed the patient to call her endocrinologist tomorrow to follow-up on the supply issues.  I also encouraged her to take her blood sugars.  She is otherwise hemodynamically stable for discharge.  We discussed return precautions.  She was understanding and is agreeable.  Case discussed with Dr. Kathrynn Humble was agreeable to the plan and disposition. Final Clinical Impression(s) / ED Diagnoses Final diagnoses:  Hyperglycemia    Rx / DC Orders ED Discharge Orders     None          Garald Balding, PA-C 06/20/22 [place], MD 06/21/22 1614

## 2022-06-20 NOTE — ED Triage Notes (Signed)
Patient presents to ED via POV from PCP office. Sent for hyperglycemia, GCS in office was 530. Denies nausea, vomiting. Well appearing.

## 2022-06-20 NOTE — Discharge Instructions (Addendum)
You were evaluated in the Emergency Department and after careful evaluation, we did not find any emergent condition requiring admission or further testing in the hospital.  Please call your endocrinologist tomorrow to help sort out the supply issues for your medications.  You can take your long-acting insulin tonight.  Please start checking her blood sugars as we discussed, your sugars are likely chronically high.  Please return to the Emergency Department if you experience any worsening of your condition.  We encourage you to follow up with a primary care provider.  Thank you for allowing [place] to be a part of your care.

## 2022-06-21 LAB — COMPREHENSIVE METABOLIC PANEL
Albumin: 4.2 (ref 3.5–5.0)
Calcium: 9.6 (ref 8.7–10.7)
Globulin: 2.8
eGFR: 86

## 2022-06-21 LAB — BASIC METABOLIC PANEL
BUN: 9 (ref 4–21)
CO2: 20 (ref 13–22)
Chloride: 101 (ref 99–108)
Creatinine: 0.8 (ref 0.5–1.1)
Glucose: 184
Potassium: 4.2 mEq/L (ref 3.5–5.1)
Sodium: 142 (ref 137–147)

## 2022-06-21 LAB — LIPID PANEL
Cholesterol: 317 — AB (ref 0–200)
HDL: 51 (ref 35–70)
LDL Cholesterol: 210
Triglycerides: 278 — AB (ref 40–160)

## 2022-06-21 LAB — HEPATIC FUNCTION PANEL
ALT: 70 U/L — AB (ref 7–35)
AST: 86 — AB (ref 13–35)
Alkaline Phosphatase: 191 — AB (ref 25–125)

## 2022-06-21 LAB — TSH: TSH: 0.19 — AB (ref 0.41–5.90)

## 2022-06-21 LAB — HEMOGLOBIN A1C: Hemoglobin A1C: 10.1

## 2022-06-29 ENCOUNTER — Encounter: Payer: Self-pay | Admitting: Adult Health

## 2022-06-29 ENCOUNTER — Ambulatory Visit: Payer: BC Managed Care – PPO | Admitting: Adult Health

## 2022-06-29 ENCOUNTER — Telehealth: Payer: Self-pay | Admitting: Family Medicine

## 2022-06-29 DIAGNOSIS — F411 Generalized anxiety disorder: Secondary | ICD-10-CM | POA: Diagnosis not present

## 2022-06-29 DIAGNOSIS — F410 Panic disorder [episodic paroxysmal anxiety] without agoraphobia: Secondary | ICD-10-CM

## 2022-06-29 DIAGNOSIS — F428 Other obsessive-compulsive disorder: Secondary | ICD-10-CM

## 2022-06-29 DIAGNOSIS — F331 Major depressive disorder, recurrent, moderate: Secondary | ICD-10-CM | POA: Diagnosis not present

## 2022-06-29 NOTE — Telephone Encounter (Signed)
Patient called to advise her Thyroid level was high yesterday when she visited her Endocrinologist, Dr. Chalmers Cater and wanted to follow up to see if Dr. Lorelei Pont could see how high. Please call patient to discuss.

## 2022-06-29 NOTE — Progress Notes (Signed)
Natalie Richard [id number] December 14, 1965 56 y.o.  Subjective:   Patient ID:  Natalie Richard is a 56 y.o. (DOB Apr 16, 1966) female.  Chief Complaint: No chief complaint on file.   HPI Natalie Richard presents to the office today for follow-up of MDD, GAD, obsesional thoughts, panic disorder.  Describes mood today as "ok". Pleasant. Decreased tearfulness. Mood symptoms - reports depression, anxiety and irritability - "I just don't feel like myself". Denies worry and rumination. Mood is lower. Stating "I don't feel a lot better". Taking medications as prescribed and is not sure if they are helpful. Recent visit with endocrinology revealed an elevated BGS and A1C. Concerns that an elevated thyroid level may also be contributing to lower mood - upcoming CT scan. Having difficulties getting "going" - "I just feel tired". Feels like she is struggling to complete self care - bathing twice a week. Husband doing well - awaiting a rotator cuff surgery. Varying interest and motivation. Taking medications as prescribed.  Energy levels lower. Active, does not have a regular exercise routine.  Enjoys some usual interests and activities. Married. Lives with husband - 1 cat - 1 dog. Spending time with family. Appetite decreased - eating less. Weight stable 230 pounds. Sleeps well most nights. Averages 8 or more hours. Focus and concentration difficulties. Completing tasks. Managing aspects of household. Not working currently. Denies SI or HI.  Denies AH or VH. Denies self harm. Denies substance abuse.  Previously seen by Dr. Tory Emerald  Previous medication trials: Abilify, Gabapentin, Shary Decamp, Effexor    PHQ2-9    [facility] [place] Office Visit from 12/15/2021 in [facility] at [place] Visit from 12/06/2021 in [facility] at [place] Visit from 09/02/2021 in [facility] at [place] Visit from  05/10/2021 in [facility] at [place] Visit from 02/10/2021 in [facility] at [place]  PHQ-2 Total Score 0 0 0 2 0  PHQ-9 Total Score 0 -- 3 4 --      Flowsheet Row ED from 06/20/2022 in [facility] ED from 12/15/2021 in [facility] No Risk No Risk        Review of Systems:  Review of Systems  Musculoskeletal:  Negative for gait problem.  Neurological:  Negative for tremors.  Psychiatric/Behavioral:         Please refer to HPI    Medications: I have reviewed the patient's current medications.  Current Outpatient Medications  Medication Sig Dispense Refill   brexpiprazole (REXULTI) 2 MG TABS tablet Take one tablet daily. 30 tablet 2   citalopram (CELEXA) 40 MG tablet Take 1 tablet (40 mg total) by mouth daily. 90 tablet 3   clonazePAM (KLONOPIN) 0.5 MG tablet Take 1 tablet (0.5 mg total) by mouth 2 (two) times daily. 60 tablet 2   Dulaglutide (TRULICITY) 4.5 YP/9.5KD SOPN 4.13m     Insulin Glargine (BASAGLAR KWIKPEN) 100 UNIT/ML Give [place] daily- current dose 46 units.  Estimate max dose 50 units (Patient taking differently: Give [place] daily- current dose 50 units Am 70 units PM.) 15 mL 5   Insulin Pen Needle (BD PEN NEEDLE NANO 2ND GEN) 32G X 4 MM MISC USE AS DIRECTED 100 each 3   Loratadine 10 MG CAPS Take by mouth.     Multiple Vitamins-Minerals (MULTIVITAMIN WITH MINERALS) tablet Take 1 tablet by mouth  daily.     nitrofurantoin, macrocrystal-monohydrate, (MACROBID) 100 MG capsule Take 1 capsule (100 mg total) by mouth 2 (two) times daily. 14 capsule 0   ONETOUCH VERIO test strip USE AS INSTRUCTED- MAY CHECK BLOOD SUGAR UP TO TWICE A DAY. 100 strip 12   pantoprazole (PROTONIX) 40 MG tablet TAKE 1 TABLET BY MOUTH EVERY DAY 90 tablet 3   pioglitazone (ACTOS) 30 MG tablet TAKE 1 TABLET BY MOUTH EVERY DAY 90 tablet 1   rosuvastatin (CRESTOR) 40  MG tablet TAKE 1 TABLET BY MOUTH EVERY DAY 90 tablet 0   No current facility-administered medications for this visit.    Medication Side Effects: None  Allergies:  Allergies  Allergen Reactions   Buspar [Buspirone] Other (See Comments)    Lip swelling and rash numbness    Augmentin [Amoxicillin-Pot Clavulanate] Nausea And Vomiting   Biaxin [Clarithromycin]    Ceftin [Cefuroxime] Swelling    Eye swelling, shortness of breath    Ciprofloxacin     Developed itching with IV form in the hospital, but has since taken PO without a problem   Dapagliflozin Other (See Comments)    Burning upon urination. Increased blood sugar.   Doxycycline Itching   Levaquin [Levofloxacin] Hives   Metformin And Related Nausea And Vomiting   Phenazopyridine Hcl Other (See Comments)    Does not remember reaction   Pyridium [Phenazopyridine Hcl]     Does not remember reaction   Sulfa Antibiotics     As a younger person had itching, but has taken more recently and did ok   Latex Rash   Lorabid [Loracarbef] Rash    Past Medical History:  Diagnosis Date   Anxiety    Cancer ([place])    skin- Right shin squamous cell   Depression    Diabetes mellitus    Diabetes mellitus, type II ([place])    GERD (gastroesophageal reflux disease)    Hematoma    on buttock   Hyperlipidemia    Hypertension    Migraine    Seasonal allergies     Past Medical History, Surgical history, Social history, and Family history were reviewed and updated as appropriate.   Please see review of systems for further details on the patient's review from today.   Objective:   Physical Exam:  There were no vitals taken for this visit.  Physical Exam Constitutional:      General: She is not in acute distress. Musculoskeletal:        General: No deformity.  Neurological:     Mental Status: She is alert and oriented to person, place, and time.     Coordination: Coordination normal.  Psychiatric:        Attention and  Perception: Attention and perception normal. She does not perceive auditory or visual hallucinations.        Mood and Affect: Mood normal. Mood is not anxious or depressed. Affect is not labile, blunt, angry or inappropriate.        Speech: Speech normal.        Behavior: Behavior normal.        Thought Content: Thought content normal. Thought content is not paranoid or delusional. Thought content does not include homicidal or suicidal ideation. Thought content does not include homicidal or suicidal plan.        Cognition and Memory: Cognition and memory normal.        Judgment: Judgment normal.     Comments: Insight intact     Lab Review:  Component Value Date/Time   NA 133 (L) 06/20/2022 1453   K 3.4 (L) 06/20/2022 1453   CL 101 06/20/2022 1453   CO2 20 (L) 06/20/2022 1453   GLUCOSE 499 (H) 06/20/2022 1453   BUN 11 06/20/2022 1453   CREATININE 0.91 06/20/2022 1453   CREATININE 0.81 07/06/2020 1128   CALCIUM 8.7 (L) 06/20/2022 1453   PROT 6.3 01/03/2022 1515   ALBUMIN 3.9 01/03/2022 1515   AST 74 (H) 01/03/2022 1515   ALT 61 (H) 01/03/2022 1515   ALKPHOS 121 (H) 01/03/2022 1515   BILITOT 0.4 01/03/2022 1515   GFRNONAA >60 06/20/2022 1453   GFRNONAA >89 04/08/2016 1426   GFRAA >60 04/02/2020 1243   GFRAA >89 04/08/2016 1426       Component Value Date/Time   WBC 7.1 06/20/2022 1453   RBC 5.38 (H) 06/20/2022 1453   HGB 14.8 06/20/2022 1453   HCT 45.2 06/20/2022 1453   PLT 283 06/20/2022 1453   MCV 84.0 06/20/2022 1453   MCV 78.8 (A) 03/29/2015 0938   MCH 27.5 06/20/2022 1453   MCHC 32.7 06/20/2022 1453   RDW 13.8 06/20/2022 1453   LYMPHSABS 2.0 06/20/2022 1453   MONOABS 0.5 06/20/2022 1453   EOSABS 0.0 06/20/2022 1453   BASOSABS 0.0 06/20/2022 1453    No results found for: "POCLITH", "LITHIUM"   No results found for: "PHENYTOIN", "PHENOBARB", "VALPROATE", "CBMZ"   .res Assessment: Plan:    Plan:  PDMP reviewed  1. Celexa 51m daily 2. Clonazepam 0.554m BID 3. Increase Rexulti 49m65maily   Will call to schedule an appt after thyroid testing completed  Patient advised to contact office with any questions, adverse effects, or acute worsening in signs and symptoms.  Discussed potential benefits, risk, and side effects of benzodiazepines to include potential risk of tolerance and dependence, as well as possible drowsiness.  Advised patient not to drive if experiencing drowsiness and to take lowest possible effective dose to minimize risk of dependence and tolerance.  Discussed potential metabolic side effects associated with atypical antipsychotics, as well as potential risk for movement side effects. Advised pt to contact office if movement side effects occur.  There are no diagnoses linked to this encounter.   Please see After Visit Summary for patient specific instructions.  No future appointments.  No orders of the defined types were placed in this encounter.   -------------------------------

## 2022-07-01 ENCOUNTER — Other Ambulatory Visit: Payer: Self-pay | Admitting: Endocrinology

## 2022-07-01 ENCOUNTER — Other Ambulatory Visit (HOSPITAL_COMMUNITY): Payer: Self-pay | Admitting: Endocrinology

## 2022-07-01 DIAGNOSIS — E0590 Thyrotoxicosis, unspecified without thyrotoxic crisis or storm: Secondary | ICD-10-CM

## 2022-07-01 NOTE — Telephone Encounter (Signed)
Called her back and LMOM- we have requested her labs, I cannot see on my Epic.  However I would generally defer to Dr Almetta Lovely advice about a thyroid issue in any case.  Will watch out for her labs, let me know if I can offer further assistance

## 2022-07-01 NOTE — Telephone Encounter (Signed)
FYI: Rec Release faxed over to Dr Porfirio Oar office for labs.

## 2022-07-12 ENCOUNTER — Telehealth: Payer: Self-pay | Admitting: Family Medicine

## 2022-07-12 NOTE — Telephone Encounter (Signed)
All meters that we have in office are expired. Will call pt to let her know.

## 2022-07-12 NOTE — Telephone Encounter (Signed)
Pt called asking if we still have BG monitors as samples in the office. Pt would like a call back to advise her of that information when available.

## 2022-07-18 ENCOUNTER — Encounter (HOSPITAL_COMMUNITY)
Admission: RE | Admit: 2022-07-18 | Discharge: 2022-07-18 | Disposition: A | Discharge status: Home / Self Care | Payer: BC Managed Care – PPO | Source: Ambulatory Visit | Attending: Endocrinology | Admitting: Endocrinology

## 2022-07-18 DIAGNOSIS — E0590 Thyrotoxicosis, unspecified without thyrotoxic crisis or storm: Secondary | ICD-10-CM | POA: Diagnosis not present

## 2022-07-18 MED ORDER — SODIUM IODIDE I-123 7.4 MBQ CAPS
434.0000 | ORAL_CAPSULE | Freq: Once | ORAL | Status: AC
  Administered 2022-07-18: 434 via ORAL

## 2022-07-19 ENCOUNTER — Encounter (HOSPITAL_COMMUNITY)
Admission: RE | Admit: 2022-07-19 | Discharge: 2022-07-19 | Disposition: A | Discharge status: Home / Self Care | Payer: BC Managed Care – PPO | Source: Ambulatory Visit | Attending: Endocrinology | Admitting: Endocrinology

## 2022-08-08 ENCOUNTER — Other Ambulatory Visit: Payer: Self-pay | Admitting: Family Medicine

## 2022-08-08 DIAGNOSIS — E1165 Type 2 diabetes mellitus with hyperglycemia: Secondary | ICD-10-CM

## 2022-09-16 ENCOUNTER — Other Ambulatory Visit: Payer: Self-pay | Admitting: Adult Health

## 2022-09-16 DIAGNOSIS — F331 Major depressive disorder, recurrent, moderate: Secondary | ICD-10-CM

## 2022-10-29 ENCOUNTER — Other Ambulatory Visit: Payer: Self-pay | Admitting: Adult Health

## 2022-10-29 DIAGNOSIS — F331 Major depressive disorder, recurrent, moderate: Secondary | ICD-10-CM

## 2022-10-29 NOTE — Telephone Encounter (Signed)
Please call to schedule an appt  

## 2022-10-31 NOTE — Telephone Encounter (Signed)
Pt is scheduled  for 2/7

## 2022-11-09 ENCOUNTER — Encounter: Payer: Self-pay | Admitting: Adult Health

## 2022-11-09 ENCOUNTER — Ambulatory Visit: Payer: BC Managed Care – PPO | Admitting: Adult Health

## 2022-11-09 DIAGNOSIS — F410 Panic disorder [episodic paroxysmal anxiety] without agoraphobia: Secondary | ICD-10-CM

## 2022-11-09 DIAGNOSIS — F428 Other obsessive-compulsive disorder: Secondary | ICD-10-CM | POA: Diagnosis not present

## 2022-11-09 DIAGNOSIS — F411 Generalized anxiety disorder: Secondary | ICD-10-CM | POA: Diagnosis not present

## 2022-11-09 DIAGNOSIS — F331 Major depressive disorder, recurrent, moderate: Secondary | ICD-10-CM | POA: Diagnosis not present

## 2022-11-09 MED ORDER — BREXPIPRAZOLE 2 MG PO TABS: 2.0000 mg | ORAL_TABLET | Freq: Every day | ORAL | 5 refills | Status: DC

## 2022-11-09 MED ORDER — CLONAZEPAM 0.5 MG PO TABS: 0.5000 mg | ORAL_TABLET | Freq: Two times a day (BID) | ORAL | 2 refills | Status: DC

## 2022-11-09 NOTE — Progress Notes (Signed)
Natalie Richard [id number] 11/22/1965 57 y.o.  Subjective:   Patient ID:  Natalie Richard is a 57 y.o. (DOB 06-Dec-1965) female.  Chief Complaint: No chief complaint on file.   HPI Natalie Richard presents to the office today for follow-up of MDD, GAD, obsesional thoughts, panic disorder.  Describes mood today as "ok". Pleasant. Decreased tearfulness. Mood symptoms - reports some depression, anxiety and irritability. Denies worry and rumination. Reports over thinking. Mood is lower. Stating "I'm doing about the same". Feels like medications are helpful. 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   Varying interest and motivation. Taking medications as prescribed.  Energy levels lower. Active, does not have a regular exercise routine.  Enjoys some usual interests and activities. Married. Lives with husband - 1 cat - 1 dog. Spending time with family. Appetite decreased - eating less. Weight stable 230 pounds. Sleeps well most nights. Averages 8 or more hours. Focus and concentration difficulties - "sometimes". Completing tasks. Managing aspects of household. Not working currently. Denies SI or HI.  Denies AH or VH. Denies self harm. Denies substance abuse.  Previously seen by Dr. Tory Emerald  Previous medication trials: Abilify, Gabapentin, Shary Decamp, Effexor    PHQ2-9    [place] Office Visit from 12/15/2021 in [facility] at [place] Visit from 12/06/2021 in [facility] Primary [facility] at [place] Visit from 09/02/2021 in [facility] at [place] Visit from 05/10/2021 in [facility] Primary [facility] at [place] Visit from 02/10/2021 in [facility] Primary Care at [facility]  PHQ-2 Total Score 0 0 0 2 0  PHQ-9 Total Score 0 -- 3 4 --      Flowsheet Row ED from 06/20/2022 in [facility] Emergency Department at [facility] ED from 12/15/2021 in [facility] Emergency Department at [place] No Risk No Risk        Review of Systems:  Review of Systems  Musculoskeletal:  Negative for gait problem.  Neurological:  Negative for tremors.  Psychiatric/Behavioral:         Please refer to HPI    Medications: I have reviewed the patient's current medications.  Current Outpatient Medications  Medication Sig Dispense Refill   BD PEN NEEDLE NANO 2ND GEN 32G X 4 MM MISC USE AS DIRECTED 100 each 3   brexpiprazole (REXULTI) 2 MG TABS tablet Take 1 tablet (2 mg total) by mouth daily. 30 tablet 5   citalopram (CELEXA) 40 MG tablet Take 1 tablet (40 mg total) by mouth daily. 90 tablet 3   clonazePAM (KLONOPIN) 0.5 MG tablet Take 1 tablet (0.5 mg total) by mouth 2 (two) times daily. 60 tablet 2   Dulaglutide (TRULICITY) 4.5 TF/5.7DU SOPN 4.'5mg'$      Insulin Glargine (BASAGLAR KWIKPEN) 100 UNIT/ML Give [place] daily- current dose 46 units.  Estimate max dose 50 units (Patient taking differently: Give [place] daily- current dose 50 units Am 70 units PM.) 15 mL 5   Loratadine 10 MG CAPS Take by mouth.     Multiple Vitamins-Minerals (MULTIVITAMIN WITH MINERALS) tablet Take 1 tablet by mouth daily.     nitrofurantoin, macrocrystal-monohydrate, (MACROBID) 100 MG capsule Take 1 capsule (100 mg total) by mouth 2 (two) times daily. 14 capsule 0   ONETOUCH VERIO test strip USE AS INSTRUCTED- MAY CHECK BLOOD SUGAR UP TO TWICE A DAY. 100  strip 12   pantoprazole (PROTONIX) 40 MG tablet TAKE 1 TABLET BY MOUTH EVERY DAY 90 tablet 3   pioglitazone (ACTOS) 30 MG tablet TAKE 1 TABLET BY MOUTH EVERY DAY 90 tablet 1   rosuvastatin (CRESTOR) 40 MG tablet TAKE 1 TABLET BY MOUTH EVERY DAY 90 tablet 0   No current facility-administered medications for this visit.    Medication Side Effects: None  Allergies:  Allergies  Allergen Reactions   Buspar [Buspirone] Other (See Comments)    Lip swelling and rash  numbness    Augmentin [Amoxicillin-Pot Clavulanate] Nausea And Vomiting   Biaxin [Clarithromycin]    Ceftin [Cefuroxime] Swelling    Eye swelling, shortness of breath    Ciprofloxacin     Developed itching with IV form in the hospital, but has since taken PO without a problem   Dapagliflozin Other (See Comments)    Burning upon urination. Increased blood sugar.   Doxycycline Itching   Levaquin [Levofloxacin] Hives   Metformin And Related Nausea And Vomiting   Phenazopyridine Hcl Other (See Comments)    Does not remember reaction   Pyridium [Phenazopyridine Hcl]     Does not remember reaction   Sulfa Antibiotics     As a younger person had itching, but has taken more recently and did ok   Latex Rash   Lorabid [Loracarbef] Rash    Past Medical History:  Diagnosis Date   Anxiety    Cancer ([place])    skin- Right shin squamous cell   Depression    Diabetes mellitus    Diabetes mellitus, type II ([place])    GERD (gastroesophageal reflux disease)    Hematoma    on buttock   Hyperlipidemia    Hypertension    Migraine    Seasonal allergies     Past Medical History, Surgical history, Social history, and Family history were reviewed and updated as appropriate.   Please see review of systems for further details on the patient's review from today.   Objective:   Physical Exam:  There were no vitals taken for this visit.  Physical Exam Constitutional:      General: She is not in acute distress. Musculoskeletal:        General: No deformity.  Neurological:     Mental Status: She is alert and oriented to person, place, and time.     Coordination: Coordination normal.  Psychiatric:        Attention and Perception: Attention and perception normal. She does not perceive auditory or visual hallucinations.        Mood and Affect: Mood normal. Mood is not anxious or depressed. Affect is not labile, blunt, angry or inappropriate.        Speech: Speech normal.        Behavior:  Behavior normal.        Thought Content: Thought content normal. Thought content is not paranoid or delusional. Thought content does not include homicidal or suicidal ideation. Thought content does not include homicidal or suicidal plan.        Cognition and Memory: Cognition and memory normal.        Judgment: Judgment normal.     Comments: Insight intact     Lab Review:     Component Value Date/Time   NA 142 06/21/2022 0000   K 4.2 06/21/2022 0000   CL 101 06/21/2022 0000   CO2 20 06/21/2022 0000   GLUCOSE 499 (H) 06/20/2022 1453   BUN 9 06/21/2022 0000  CREATININE 0.8 06/21/2022 0000   CREATININE 0.91 06/20/2022 1453   CREATININE 0.81 07/06/2020 1128   CALCIUM 9.6 06/21/2022 0000   PROT 6.3 01/03/2022 1515   ALBUMIN 4.2 06/21/2022 0000   AST 86 (A) 06/21/2022 0000   ALT 70 (A) 06/21/2022 0000   ALKPHOS 191 (A) 06/21/2022 0000   BILITOT 0.4 01/03/2022 1515   GFRNONAA >60 06/20/2022 1453   GFRNONAA >89 04/08/2016 1426   GFRAA >60 04/02/2020 1243   GFRAA >89 04/08/2016 1426       Component Value Date/Time   WBC 7.1 06/20/2022 1453   RBC 5.38 (H) 06/20/2022 1453   HGB 14.8 06/20/2022 1453   HCT 45.2 06/20/2022 1453   PLT 283 06/20/2022 1453   MCV 84.0 06/20/2022 1453   MCV 78.8 (A) 03/29/2015 0938   MCH 27.5 06/20/2022 1453   MCHC 32.7 06/20/2022 1453   RDW 13.8 06/20/2022 1453   LYMPHSABS 2.0 06/20/2022 1453   MONOABS 0.5 06/20/2022 1453   EOSABS 0.0 06/20/2022 1453   BASOSABS 0.0 06/20/2022 1453    No results found for: "POCLITH", "LITHIUM"   No results found for: "PHENYTOIN", "PHENOBARB", "VALPROATE", "CBMZ"   .res Assessment: Plan:    Plan:  PDMP reviewed  1. Celexa '40mg'$  daily 2. Clonazepam 0.'5mg'$  BID 3. Rexulti '2mg'$  daily   Patient advised to contact office with any questions, adverse effects, or acute worsening in signs and symptoms.  Discussed potential benefits, risk, and side effects of benzodiazepines to include potential risk of tolerance  and dependence, as well as possible drowsiness.  Advised patient not to drive if experiencing drowsiness and to take lowest possible effective dose to minimize risk of dependence and tolerance.  Discussed potential metabolic side effects associated with atypical antipsychotics, as well as potential risk for movement side effects. Advised pt to contact office if movement side effects occur.  Diagnoses and all orders for this visit:  Major depressive disorder, recurrent episode, moderate (HCC) -     brexpiprazole (REXULTI) 2 MG TABS tablet; Take 1 tablet (2 mg total) by mouth daily.  Panic disorder -     clonazePAM (KLONOPIN) 0.5 MG tablet; Take 1 tablet (0.5 mg total) by mouth 2 (two) times daily.  Generalized anxiety disorder  Obsessional thoughts     Please see After Visit Summary for patient specific instructions.  No future appointments.  No orders of the defined types were placed in this encounter.   -------------------------------

## 2022-12-19 ENCOUNTER — Encounter: Payer: Self-pay | Admitting: Internal Medicine

## 2022-12-19 ENCOUNTER — Ambulatory Visit: Payer: BC Managed Care – PPO | Admitting: Internal Medicine

## 2022-12-19 VITALS — BP 126/60 | HR 88 | Temp 97.8°F | Resp 18 | Ht 66.0 in | Wt 221.2 lb

## 2022-12-19 DIAGNOSIS — U071 COVID-19: Secondary | ICD-10-CM | POA: Diagnosis not present

## 2022-12-19 LAB — BASIC METABOLIC PANEL
BUN: 8 mg/dL (ref 7–25)
CO2: 26 mmol/L (ref 20–32)
Calcium: 8.7 mg/dL (ref 8.6–10.4)
Chloride: 98 mmol/L (ref 98–110)
Creat: 0.81 mg/dL (ref 0.50–1.03)
Glucose, Bld: 424 mg/dL — ABNORMAL HIGH (ref 65–99)
Potassium: 3.5 mmol/L (ref 3.5–5.3)
Sodium: 137 mmol/L (ref 135–146)

## 2022-12-19 LAB — POC COVID19 BINAXNOW: SARS Coronavirus 2 Ag: POSITIVE — AB

## 2022-12-19 MED ORDER — BENZONATATE 200 MG PO CAPS: 200.0000 mg | ORAL_CAPSULE | Freq: Three times a day (TID) | ORAL | 0 refills | Status: DC | PRN

## 2022-12-19 NOTE — Patient Instructions (Signed)
Please do your blood work now, will call your COVID medication first thing in the morning.  Rest, fluids , tylenol  For cough:  Take Mucinex DM or Robitussin-DM OTC.  Follow the instructions in the box.  Also Tessalon Perles 3 times a day as needed  For nasal congestion: -Use over-the-counter Flonase: 2 nasal sprays on each side of the nose in the morning until you feel better  Avoid decongestants such as  Pseudoephedrine or phenylephrine   If your blood sugar was very high, please reach out to your endocrinologist  Call if not gradually better over the next  10 days  Call anytime if the symptoms are severe, you have high fever, short of breath, chest pain  You will be contagious for at least the next 5 days.  Proceed with a COVID-vaccine in 1 month

## 2022-12-19 NOTE — Progress Notes (Unsigned)
Subjective:    Patient ID: Natalie Richard, female    DOB: [birth year], 57 y.o.   MRN: [id number]  DOS:  12/19/2022 Type of visit - description: Acute Symptoms started 1 to 2 days ago Runny nose, watery eyes, headache, subjective fevers. Also some nausea but no vomiting. No diarrhea. Denies any rash or unusual aches. No chest pain or difficulty breathing. Has been taking OTCs with partial help.  The cough has been somewhat severe except for today (slt better)  Review of Systems See above   Past Medical History:  Diagnosis Date   Anxiety    Cancer ([place])    skin- Right shin squamous cell   Depression    Diabetes mellitus    Diabetes mellitus, type II ([place])    GERD (gastroesophageal reflux disease)    Hematoma    on buttock   Hyperlipidemia    Hypertension    Migraine    Seasonal allergies     Past Surgical History:  Procedure Laterality Date   ABDOMINAL HYSTERECTOMY  2004   PCOS   EYE SURGERY     INNER EAR SURGERY Left 08/2016    Current Outpatient Medications  Medication Instructions   BD PEN NEEDLE NANO 2ND GEN 32G X 4 MM MISC USE AS DIRECTED   brexpiprazole (REXULTI) 2 mg, Oral, Daily   citalopram (CELEXA) 40 mg, Oral, Daily   clonazePAM (KLONOPIN) 0.5 mg, Oral, 2 times daily   Dulaglutide (TRULICITY) 4.5 0000000 SOPN 4.5mg    Insulin Glargine (BASAGLAR KWIKPEN) 100 UNIT/ML Give [place] daily- current dose 46 units.  Estimate max dose 50 units   Loratadine 10 MG CAPS Oral   Multiple Vitamins-Minerals (MULTIVITAMIN WITH MINERALS) tablet 1 tablet, Daily   nitrofurantoin (macrocrystal-monohydrate) (MACROBID) 100 mg, Oral, 2 times daily   ONETOUCH VERIO test strip USE AS INSTRUCTED- MAY CHECK BLOOD SUGAR UP TO TWICE A DAY.   pantoprazole (PROTONIX) 40 MG tablet TAKE 1 TABLET BY MOUTH EVERY DAY   pioglitazone (ACTOS) 30 MG tablet TAKE 1 TABLET BY MOUTH EVERY DAY   rosuvastatin (CRESTOR) 40 MG tablet TAKE 1 TABLET BY MOUTH EVERY DAY   Tresiba FlexTouch 75 Units,  Subcutaneous, 2 times daily       Objective:   Physical Exam BP 126/60   Pulse 88   Temp 97.8 F (36.6 C) (Oral)   Resp 18   Ht 5\' 6"  (1.676 m)   Wt 221 lb 4 oz (100.4 kg)   SpO2 97%   BMI 35.71 kg/m  General:   Well developed, NAD, BMI noted. HEENT:  Normocephalic . Face symmetric, atraumatic. TM on the right: Bulged but not red. TM on the left normal Lungs:  CTA B Normal respiratory effort, no intercostal retractions, no accessory muscle use. Heart: RRR,  no murmur.  Lower extremities: no pretibial edema bilaterally  Skin: Not pale. Not jaundice Neurologic:  alert & oriented X3.  Speech normal, gait appropriate for age and unassisted Psych--  Cognition and judgment appear intact.  Cooperative with normal attention span and concentration.  Behavior appropriate. No anxious or depressed appearing.      Assessment     57 year old female, PMH includes DM, HTN, migraines, anxiety depression, hyperlipidemia, hysterectomy, presents with  COVID-19: Symptoms started 1 or 2 days ago.  Nontoxic-appearing.  Had 2 COVID vaccines before but nothing recent. Plan:  BMP stat, start antivirals with results. Rest, fluids, Tylenol, Robitussin DM, Tessalon Perles, call if not gradually better, call if symptoms persist.  See AVS. She  will be contagious for few days.  Recommend a covid booster in a month. She verbalized understanding.

## 2022-12-20 ENCOUNTER — Telehealth: Payer: Self-pay | Admitting: Family Medicine

## 2022-12-20 ENCOUNTER — Telehealth: Payer: Self-pay

## 2022-12-20 MED ORDER — HYDROCODONE BIT-HOMATROP MBR 5-1.5 MG/5ML PO SOLN: 5.0000 mL | Freq: Two times a day (BID) | ORAL | 0 refills | Status: DC | PRN

## 2022-12-20 MED ORDER — NIRMATRELVIR/RITONAVIR (PAXLOVID)TABLET: 3.0000 | ORAL_TABLET | Freq: Two times a day (BID) | ORAL | 0 refills | Status: AC

## 2022-12-20 NOTE — Telephone Encounter (Signed)
Pt returned Natalie Richard's call. Please call back to discuss.

## 2022-12-20 NOTE — Telephone Encounter (Signed)
Spoke w/ Pt- informed rx sent. Watch for excessive drowsiness. Pt verbalized understanding.

## 2022-12-20 NOTE — Telephone Encounter (Signed)
Nurse Assessment Nurse: Cox, RN, [place] Date/Time (Eastern Time): 12/19/2022 7:08:14 PM Confirm and document reason for call. If symptomatic, describe symptoms. ---Caller states she has Covid and her cough is getting worse and is wanting to know if she can get something else prescribed for the cough( already prescribed tessalon perles) also taking Robitussin DM. Has had cough since Saturday, and fever but no fever today. Does the patient have any new or worsening symptoms? ---Yes Will a triage be completed? ---Yes Related visit to physician within the last 2 weeks? ---Yes Does the PT have any chronic conditions? (i.e. diabetes, asthma, this includes High risk factors for pregnancy, etc.) ---Yes List chronic conditions. ---DM Is this a behavioral health or substance abuse call? ---No Guidelines Guideline Title Affirmed Question Affirmed Notes Nurse Date/Time (Eastern Time) Cough - Acute Productive [1] Continuous (nonstop) coughing interferes with work or school AND [2] no improvement using Cox, RN, [facility] 12/19/2022 7:09:09 PM PLEASE NOTE: All timestamps contained within this report are represented as [place] Standard Time. CONFIDENTIALTY NOTICE: This fax transmission is intended only for the addressee. It contains information that is legally privileged, confidential or otherwise protected from use or disclosure. If you are not the intended recipient, you are strictly prohibited from reviewing, disclosing, copying using or disseminating any of this information or taking any action in reliance on or regarding this information. If you have received this fax in error, please notify us immediately by telephone so that we can arrange for its return to [place]. Phone: [phone], Toll-Free: [phone], Fax: [phone] Page: 2 of 2 Call Id: [id number] Guidelines Guideline Title Affirmed Question Affirmed Notes Nurse Date/Time Eilene Ghazi Time) cough treatment per Care Advice Disp. Time  Eilene Ghazi Time) Disposition Final User 12/19/2022 7:13:54 PM See PCP within 24 Hours Yes Cox, RN, [place] Final Disposition 12/19/2022 7:13:54 PM See PCP within 24 Hours Yes Cox, RN, [employer] Understands Yes PreDisposition Call Doctor Care Advice Given Per Guideline SEE PCP WITHIN 24 HOURS: * IF OFFICE WILL BE OPEN: You need to be examined within the next 24 hours. Call your doctor (or NP/PA) when the office opens and make an appointment. COUGH MEDICINES: * COUGH DROPS: Over-the-counter cough drops can help a lot, especially for mild coughs. They soothe an irritated throat and remove the tickle sensation in the back of the throat. Cough drops are easy to carry with you. * COUGH SYRUP WITH DEXTROMETHORPHAN: An over-the-counter cough syrup can help your cough. The most common cough suppressant in over-the-counter cough medicines is dextromethorphan. * HOME REMEDY - HONEY: This old home remedy has been shown to help decrease coughing at night. The adult dosage is 2 teaspoons (10 ml) at bedtime. HUMIDIFIER: * If the air is dry, use a humidifier in the bedroom. COUGHING SPELLS: * Drink warm fluids. Inhale warm mist. This can help relax the airway and also loosen up phlegm. * Suck on cough drops or hard candy to coat the irritated throat. CALL BACK IF: * Difficulty breathing occurs * You become worse CARE ADVICE given per Cough - Acute Productive (Adult) guideline. Comments User: Tommy Medal, RN Date/Time (Eastern Time): 12/19/2022 7:14:50 PM Caller states that she was seen in the office and given a prescription for the Tessalon but that it is not working for her cough and wanted to know if there was an On Call to get a different prescription. Referrals REFERRED TO PCP OFFICE

## 2022-12-20 NOTE — Addendum Note (Signed)
Addended by: Kathlene November E on: 12/20/2022 09:55 AM   Modules accepted: Orders

## 2022-12-20 NOTE — Telephone Encounter (Signed)
Spoke w/ Pt- she has already taken a whole tab of Rexulti today, informed to start Paxlovid tomorrow (she will still be within 5 days of symptom onset). She wanted to see if Dr. Larose Kells could call in a cough syrup, tessalon not helping much with cough.

## 2022-12-20 NOTE — Telephone Encounter (Signed)
Advise patient, I sent hydrocodone,.  Okay to take BID, watch for somnolence.  No interactions noted.

## 2022-12-27 ENCOUNTER — Telehealth: Payer: Self-pay | Admitting: Family Medicine

## 2022-12-27 NOTE — Telephone Encounter (Signed)
Continue astelin and flonase?

## 2022-12-27 NOTE — Telephone Encounter (Signed)
Patient called to find out how long her covid symptoms (runny nose) would last. Please call to discuss

## 2022-12-27 NOTE — Telephone Encounter (Signed)
Advise patient: She should be getting better gradually in the next few days. Continue Astelin, Flonase. If fever, chills, cough, chest pain, symptoms get worse: Seek medical attention.

## 2022-12-28 NOTE — Telephone Encounter (Signed)
Spoke w/ Pt- informed of recommendations. Pt verbalized understanding.  

## 2023-02-05 ENCOUNTER — Other Ambulatory Visit: Payer: Self-pay | Admitting: Family Medicine

## 2023-03-31 ENCOUNTER — Encounter: Payer: Self-pay | Admitting: Family Medicine

## 2023-03-31 ENCOUNTER — Ambulatory Visit: Payer: BC Managed Care – PPO | Admitting: Family Medicine

## 2023-03-31 VITALS — BP 129/88 | HR 98 | Temp 97.7°F | Ht 66.0 in | Wt 204.0 lb

## 2023-03-31 DIAGNOSIS — J0140 Acute pansinusitis, unspecified: Secondary | ICD-10-CM | POA: Diagnosis not present

## 2023-03-31 MED ORDER — HYDROCOD POLI-CHLORPHE POLI ER 10-8 MG/5ML PO SUER: 5.0000 mL | Freq: Two times a day (BID) | ORAL | 0 refills | Status: AC | PRN

## 2023-03-31 MED ORDER — AMOXICILLIN 875 MG PO TABS: 875.0000 mg | ORAL_TABLET | Freq: Two times a day (BID) | ORAL | 0 refills | Status: AC

## 2023-03-31 NOTE — Progress Notes (Signed)
Acute Office Visit  Subjective:     Patient ID: Natalie Richard, female    DOB: Jan 27, 1966, 57 y.o.   MRN: [id number]  Chief Complaint  Patient presents with   Sinus Problem     Patient is in today for sinuitis.   .Discussed the use of AI scribe software for clinical note transcription with the patient, who gave verbal consent to proceed.  History of Present Illness   The patient presents with a week-long history of upper respiratory symptoms, including a sore throat, congestion, green and blood-tinged nasal discharge, bilateral ear pain, and headaches. They also report a nocturnal cough that is unproductive and severe enough to cause breathlessness and disrupt sleep. Over-the-counter treatments, including Alka-Seltzer Cold and Sinus and Robitussin, have not provided relief. The patient denies any preceding cold or allergy symptoms and has not been knowingly exposed to any sick contacts. They tested negative for COVID-19 at the onset of symptoms. The patient has a history of occasional seasonal allergies.          ROS All review of systems negative except what is listed in the HPI      Objective:    BP 129/88   Pulse 98   Temp 97.7 F (36.5 C) (Oral)   Ht 5\' 6"  (1.676 m)   Wt 204 lb (92.5 kg)   SpO2 98%   BMI 32.93 kg/m    Physical Exam Vitals reviewed.  Constitutional:      Appearance: Normal appearance.  HENT:     Head: Normocephalic and atraumatic.     Right Ear: A middle ear effusion is present. Tympanic membrane is erythematous.     Left Ear: A middle ear effusion is present. Tympanic membrane is erythematous.     Nose: Congestion and rhinorrhea present.  Cardiovascular:     Rate and Rhythm: Normal rate and regular rhythm.     Pulses: Normal pulses.     Heart sounds: Normal heart sounds.  Pulmonary:     Effort: Pulmonary effort is normal.     Breath sounds: Normal breath sounds.  Musculoskeletal:     Cervical back: Normal range of motion and neck  supple.  Lymphadenopathy:     Cervical: No cervical adenopathy.  Skin:    General: Skin is warm and dry.  Neurological:     Mental Status: She is alert and oriented to person, place, and time.  Psychiatric:        Mood and Affect: Mood normal.        Behavior: Behavior normal.        Thought Content: Thought content normal.        Judgment: Judgment normal.     No results found for any visits on 03/31/23.      Assessment & Plan:   Problem List Items Addressed This Visit   None Visit Diagnoses     Acute non-recurrent pansinusitis    -  Primary   Relevant Medications   amoxicillin (AMOXIL) 875 MG tablet   chlorpheniramine-HYDROcodone (TUSSIONEX) 10-8 MG/5ML     Given duration, start with a few more days of supportive measures. Adding cough syrup.  Continue supportive measures including rest, hydration, humidifier use, steam showers, warm compresses to sinuses, warm liquids with lemon and honey, and over-the-counter cough, cold, and analgesics as needed.  If not making good progress in the next 2 days, start the antibiotic (you report you tolerate amoxicillin well)  Meds ordered this encounter  Medications   amoxicillin (AMOXIL)  875 MG tablet    Sig: Take 1 tablet (875 mg total) by mouth 2 (two) times daily for 10 days.    Dispense:  20 tablet    Refill:  0    Order Specific Question:   Supervising Provider    Answer:   Danise Edge A [[id number]]   chlorpheniramine-HYDROcodone (TUSSIONEX) 10-8 MG/5ML    Sig: Take 5 mLs by mouth every 12 (twelve) hours as needed for up to 5 days.    Dispense:  50 mL    Refill:  0    Order Specific Question:   Supervising Provider    Answer:   Danise Edge A [[id number]]    Return if symptoms worsen or fail to improve.  Clayborne Dana, NP

## 2023-03-31 NOTE — Patient Instructions (Signed)
Given duration, start with a few more days of supportive measures. Adding cough syrup.  Continue supportive measures including rest, hydration, humidifier use, steam showers, warm compresses to sinuses, warm liquids with lemon and honey, and over-the-counter cough, cold, and analgesics as needed.  If not making good progress in the next 2 days, start the antibiotic (you report you tolerate amoxicillin well)

## 2023-04-05 ENCOUNTER — Other Ambulatory Visit: Payer: Self-pay | Admitting: Adult Health

## 2023-04-05 DIAGNOSIS — F3341 Major depressive disorder, recurrent, in partial remission: Secondary | ICD-10-CM

## 2023-04-05 DIAGNOSIS — F410 Panic disorder [episodic paroxysmal anxiety] without agoraphobia: Secondary | ICD-10-CM

## 2023-05-04 ENCOUNTER — Other Ambulatory Visit: Payer: Self-pay | Admitting: Family Medicine

## 2023-05-10 ENCOUNTER — Ambulatory Visit: Payer: BC Managed Care – PPO | Admitting: Adult Health

## 2023-05-22 ENCOUNTER — Encounter: Payer: Self-pay | Admitting: Adult Health

## 2023-05-22 ENCOUNTER — Ambulatory Visit: Payer: BC Managed Care – PPO | Admitting: Adult Health

## 2023-05-22 DIAGNOSIS — F331 Major depressive disorder, recurrent, moderate: Secondary | ICD-10-CM | POA: Diagnosis not present

## 2023-05-22 DIAGNOSIS — F410 Panic disorder [episodic paroxysmal anxiety] without agoraphobia: Secondary | ICD-10-CM

## 2023-05-22 DIAGNOSIS — F411 Generalized anxiety disorder: Secondary | ICD-10-CM | POA: Diagnosis not present

## 2023-05-22 DIAGNOSIS — F428 Other obsessive-compulsive disorder: Secondary | ICD-10-CM

## 2023-05-22 NOTE — Progress Notes (Signed)
Natalie Richard [id number] 06-22-1966 57 y.o.  Subjective:   Patient ID:  Natalie Richard is a 56 y.o. (DOB 1966/03/23) female.  Chief Complaint: No chief complaint on file.   HPI Natalie Richard presents to the office today for follow-up of MDD, GAD, obsesional thoughts, panic disorder.  Describes mood today as "ok". Pleasant. Decreased tearfulness. Mood symptoms - reports some depression, anxiety and irritability. Denies panic attacks. Reports some worry, rumination, over thinking. Mood is lower. Stating "I feel like I could do better". 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 Varying interest and motivation. Taking medications as prescribed.  Energy levels lower. Active, does not have a regular exercise routine.  Enjoys some usual interests and activities. Married. Lives with husband - 1 cat - 1 dog. Spending time with family. Appetite decreased - eating less. Weight loss 196 from 230 pounds. Sleeps well most nights. Averages 8 or more hours. Focus and concentration difficulties - "so-so". Completing tasks. Managing aspects of household. Not working currently. Denies SI or HI.  Denies AH or VH. Denies self harm. Denies substance abuse.  Previously seen by Dr. Hayden Pedro  Previous medication trials: Abilify, Gabapentin, [employer] Melia, Effexor   PHQ2-9    Flowsheet Row Office Visit from 12/19/2022 in [facility] Primary Care at [facility] Office Visit from 12/15/2021 in [facility] Primary Care at [facility] Office Visit from 12/06/2021 in [facility] Primary Care at [facility] Office Visit from 09/02/2021 in [facility] Primary Care at [facility] Office Visit from 05/10/2021 in [facility] Primary Care at [facility]  PHQ-2 Total Score 0 0 0 0 2  PHQ-9 Total Score 0 0 -- 3 4      Flowsheet Row ED from 06/20/2022 in [facility] Emergency Department at [facility] ED from  12/15/2021 in [facility] Emergency Department at [facility]  C-SSRS RISK CATEGORY No Risk No Risk        Review of Systems:  Review of Systems  Musculoskeletal:  Negative for gait problem.  Neurological:  Negative for tremors.  Psychiatric/Behavioral:         Please refer to HPI    Medications: I have reviewed the patient's current medications.  Current Outpatient Medications  Medication Sig Dispense Refill   citalopram (CELEXA) 40 MG tablet TAKE 1 TABLET BY MOUTH EVERY DAY 90 tablet 0   BD PEN NEEDLE NANO 2ND GEN 32G X 4 MM MISC USE AS DIRECTED 100 each 3   brexpiprazole (REXULTI) 2 MG TABS tablet Take 1 tablet (2 mg total) by mouth daily. 30 tablet 5   clonazePAM (KLONOPIN) 0.5 MG tablet Take 1 tablet (0.5 mg total) by mouth 2 (two) times daily. 60 tablet 2   Loratadine 10 MG CAPS Take by mouth.     Multiple Vitamins-Minerals (MULTIVITAMIN WITH MINERALS) tablet Take 1 tablet by mouth daily.     ONETOUCH VERIO test strip USE AS INSTRUCTED- MAY CHECK BLOOD SUGAR UP TO TWICE A DAY. 100 strip 12   pantoprazole (PROTONIX) 40 MG tablet Take 1 tablet (40 mg total) by mouth daily. Due for CPX 30 tablet 0   TRESIBA FLEXTOUCH 200 UNIT/ML FlexTouch Pen Inject 75 Units into the skin in the morning and at bedtime.     No current facility-administered medications for this visit.    Medication Side Effects: None  Allergies:  Allergies  Allergen Reactions   Buspar [Buspirone] Other (See  Comments)    Lip swelling and rash numbness    Augmentin [Amoxicillin-Pot Clavulanate] Nausea And Vomiting   Biaxin [Clarithromycin]    Ceftin [Cefuroxime] Swelling    Eye swelling, shortness of breath    Ciprofloxacin     Developed itching with IV form in the hospital, but has since taken PO without a problem   Dapagliflozin Other (See Comments)    Burning upon urination. Increased blood sugar.   Doxycycline Itching   Levaquin [Levofloxacin] Hives   Metformin And Related Nausea And  Vomiting   Phenazopyridine Hcl Other (See Comments)    Does not remember reaction   Pyridium [Phenazopyridine Hcl]     Does not remember reaction   Sulfa Antibiotics     As a younger person had itching, but has taken more recently and did ok   Latex Rash   Lorabid [Loracarbef] Rash    Past Medical History:  Diagnosis Date   Anxiety    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   Depression    Diabetes mellitus    Diabetes mellitus, type II (HCC)    GERD (gastroesophageal reflux disease)    Hematoma    on buttock   Hyperlipidemia    Hypertension    Migraine    Seasonal allergies     Past Medical History, Surgical history, Social history, and Family history were reviewed and updated as appropriate.   Please see review of systems for further details on the patient's review from today.   Objective:   Physical Exam:  There were no vitals taken for this visit.  Physical Exam Constitutional:      General: She is not in acute distress. Musculoskeletal:        General: No deformity.  Neurological:     Mental Status: She is alert and oriented to person, place, and time.     Coordination: Coordination normal.  Psychiatric:        Attention and Perception: Attention and perception normal. She does not perceive auditory or visual hallucinations.        Mood and Affect: Mood normal. Mood is not anxious or depressed. Affect is not labile, blunt, angry or inappropriate.        Speech: Speech normal.        Behavior: Behavior normal.        Thought Content: Thought content normal. Thought content is not paranoid or delusional. Thought content does not include homicidal or suicidal ideation. Thought content does not include homicidal or suicidal plan.        Cognition and Memory: Cognition and memory normal.        Judgment: Judgment normal.     Comments: Insight intact     Lab Review:     Component Value Date/Time   NA 137 12/19/2022 1538   NA 142 06/21/2022 0000   K  3.5 12/19/2022 1538   CL 98 12/19/2022 1538   CO2 26 12/19/2022 1538   GLUCOSE 424 (H) 12/19/2022 1538   BUN 8 12/19/2022 1538   BUN 9 06/21/2022 0000   CREATININE 0.81 12/19/2022 1538   CALCIUM 8.7 12/19/2022 1538   PROT 6.3 01/03/2022 1515   ALBUMIN 4.2 06/21/2022 0000   AST 86 (A) 06/21/2022 0000   ALT 70 (A) 06/21/2022 0000   ALKPHOS 191 (A) 06/21/2022 0000   BILITOT 0.4 01/03/2022 1515   GFRNONAA >60 06/20/2022 1453   GFRNONAA >89 04/08/2016 1426   GFRAA >60 04/02/2020 1243  GFRAA >89 04/08/2016 1426       Component Value Date/Time   WBC 7.1 06/20/2022 1453   RBC 5.38 (H) 06/20/2022 1453   HGB 14.8 06/20/2022 1453   HCT 45.2 06/20/2022 1453   PLT 283 06/20/2022 1453   MCV 84.0 06/20/2022 1453   MCV 78.8 (A) 03/29/2015 0938   MCH 27.5 06/20/2022 1453   MCHC 32.7 06/20/2022 1453   RDW 13.8 06/20/2022 1453   LYMPHSABS 2.0 06/20/2022 1453   MONOABS 0.5 06/20/2022 1453   EOSABS 0.0 06/20/2022 1453   BASOSABS 0.0 06/20/2022 1453    No results found for: "POCLITH", "LITHIUM"   No results found for: "PHENYTOIN", "PHENOBARB", "VALPROATE", "CBMZ"   .res Assessment: Plan:    Plan:  PDMP reviewed  Celexa 40mg  daily - discussed tapering off - take 1/2 tablet x 2 weeks then leave off  Add Trintellix 5mg  daily x 7 days, then increase to 10mg  daily - samples given  Clonazepam 0.5mg  BID Rexulti 2mg  daily   Patient advised to contact office with any questions, adverse effects, or acute worsening in signs and symptoms.  Discussed potential benefits, risk, and side effects of benzodiazepines to include potential risk of tolerance and dependence, as well as possible drowsiness. Advised patient not to drive if experiencing drowsiness and to take lowest possible effective dose to minimize risk of dependence and tolerance.  Discussed potential metabolic side effects associated with atypical antipsychotics, as well as potential risk for movement side effects. Advised pt to  contact office if movement side effects occur.   There are no diagnoses linked to this encounter.   Please see After Visit Summary for patient specific instructions.  Future Appointments  Date Time Provider Department Center  05/22/2023 12:00 PM Meko Bellanger, Thereasa Solo, NP CP-CP None    No orders of the defined types were placed in this encounter.   -------------------------------

## 2023-06-03 ENCOUNTER — Other Ambulatory Visit: Payer: Self-pay | Admitting: Family Medicine

## 2023-06-10 ENCOUNTER — Emergency Department (HOSPITAL_BASED_OUTPATIENT_CLINIC_OR_DEPARTMENT_OTHER)
Admission: EM | Admit: 2023-06-10 | Discharge: 2023-06-10 | Disposition: A | Discharge status: Home / Self Care | Payer: BC Managed Care – PPO | Attending: Emergency Medicine | Admitting: Emergency Medicine

## 2023-06-10 ENCOUNTER — Other Ambulatory Visit: Payer: Self-pay

## 2023-06-10 ENCOUNTER — Encounter (HOSPITAL_BASED_OUTPATIENT_CLINIC_OR_DEPARTMENT_OTHER): Payer: Self-pay | Admitting: Emergency Medicine

## 2023-06-10 DIAGNOSIS — I10 Essential (primary) hypertension: Secondary | ICD-10-CM | POA: Diagnosis not present

## 2023-06-10 DIAGNOSIS — R739 Hyperglycemia, unspecified: Secondary | ICD-10-CM

## 2023-06-10 DIAGNOSIS — Z85828 Personal history of other malignant neoplasm of skin: Secondary | ICD-10-CM | POA: Insufficient documentation

## 2023-06-10 DIAGNOSIS — Z20822 Contact with and (suspected) exposure to covid-19: Secondary | ICD-10-CM | POA: Insufficient documentation

## 2023-06-10 DIAGNOSIS — E1165 Type 2 diabetes mellitus with hyperglycemia: Secondary | ICD-10-CM | POA: Insufficient documentation

## 2023-06-10 DIAGNOSIS — R3589 Other polyuria: Secondary | ICD-10-CM | POA: Diagnosis present

## 2023-06-10 DIAGNOSIS — Z91040 Latex allergy status: Secondary | ICD-10-CM | POA: Diagnosis not present

## 2023-06-10 LAB — CBC
HCT: 42.5 % (ref 36.0–46.0)
Hemoglobin: 14 g/dL (ref 12.0–15.0)
MCH: 29 pg (ref 26.0–34.0)
MCHC: 32.9 g/dL (ref 30.0–36.0)
MCV: 88 fL (ref 80.0–100.0)
Platelets: 314 10*3/uL (ref 150–400)
RBC: 4.83 MIL/uL (ref 3.87–5.11)
RDW: 13.2 % (ref 11.5–15.5)
WBC: 7 10*3/uL (ref 4.0–10.5)
nRBC: 0 % (ref 0.0–0.2)

## 2023-06-10 LAB — CBG MONITORING, ED
Glucose-Capillary: 373 mg/dL — ABNORMAL HIGH (ref 70–99)
Glucose-Capillary: 391 mg/dL — ABNORMAL HIGH (ref 70–99)
Glucose-Capillary: 339 mg/dL — ABNORMAL HIGH (ref 70–99)

## 2023-06-10 LAB — URINALYSIS, ROUTINE W REFLEX MICROSCOPIC
Bilirubin Urine: NEGATIVE
Glucose, UA: >=500 mg/dL — AB
Hgb urine dipstick: NEGATIVE
Ketones, ur: NEGATIVE mg/dL
Leukocytes,Ua: NEGATIVE
Nitrite: NEGATIVE
Protein, ur: NEGATIVE mg/dL
Specific Gravity, Urine: 1.015 (ref 1.005–1.030)
pH: 5.5 (ref 5.0–8.0)

## 2023-06-10 LAB — I-STAT VENOUS BLOOD GAS, ED
Acid-base deficit: 1 mmol/L (ref 0.0–2.0)
Bicarbonate: 24.4 mmol/L (ref 20.0–28.0)
Calcium, Ion: 1.13 mmol/L — ABNORMAL LOW (ref 1.15–1.40)
HCT: 43 % (ref 36.0–46.0)
Hemoglobin: 14.6 g/dL (ref 12.0–15.0)
O2 Saturation: 37 %
Potassium: 3.7 mmol/L (ref 3.5–5.1)
Sodium: 136 mmol/L (ref 135–145)
TCO2: 26 mmol/L (ref 22–32)
pCO2, Ven: 44.1 mmHg (ref 44–60)
pH, Ven: 7.351 (ref 7.25–7.43)
pO2, Ven: 23 mmHg — CL (ref 32–45)

## 2023-06-10 LAB — URINALYSIS, MICROSCOPIC (REFLEX)

## 2023-06-10 LAB — BASIC METABOLIC PANEL
Anion gap: 11 (ref 5–15)
BUN: 8 mg/dL (ref 6–20)
CO2: 23 mmol/L (ref 22–32)
Calcium: 8.4 mg/dL — ABNORMAL LOW (ref 8.9–10.3)
Chloride: 99 mmol/L (ref 98–111)
Creatinine, Ser: 0.89 mg/dL (ref 0.44–1.00)
GFR, Estimated: >60 mL/min (ref >60)
Glucose, Bld: 416 mg/dL — ABNORMAL HIGH (ref 70–99)
Potassium: 3.5 mmol/L (ref 3.5–5.1)
Sodium: 133 mmol/L — ABNORMAL LOW (ref 135–145)

## 2023-06-10 LAB — SARS CORONAVIRUS 2 BY RT PCR: SARS Coronavirus 2 by RT PCR: NEGATIVE

## 2023-06-10 MED ORDER — IBUPROFEN 800 MG PO TABS
800.0000 mg | ORAL_TABLET | Freq: Once | ORAL | Status: AC
  Administered 2023-06-10: 800 mg via ORAL
  Filled 2023-06-10: qty 1

## 2023-06-10 MED ORDER — SODIUM CHLORIDE 0.9 % IV BOLUS
1000.0000 mL | Freq: Once | INTRAVENOUS | Status: AC
  Administered 2023-06-10: 1000 mL via INTRAVENOUS

## 2023-06-10 MED ORDER — INSULIN ASPART 100 UNIT/ML IJ SOLN
5.0000 [IU] | Freq: Once | INTRAMUSCULAR | Status: AC
  Administered 2023-06-10: 5 [IU] via SUBCUTANEOUS

## 2023-06-10 MED ORDER — GABAPENTIN 100 MG PO CAPS
100.0000 mg | ORAL_CAPSULE | Freq: Once | ORAL | Status: AC
  Administered 2023-06-10: 100 mg via ORAL
  Filled 2023-06-10: qty 1

## 2023-06-10 MED ORDER — INSULIN ASPART 100 UNIT/ML IJ SOLN
10.0000 [IU] | Freq: Once | INTRAMUSCULAR | Status: AC
  Administered 2023-06-10: 10 [IU] via SUBCUTANEOUS

## 2023-06-10 NOTE — Discharge Instructions (Signed)
You have been evaluated for your symptoms.  Your blood sugar is quite elevated today.  Elevated blood sugar can cause you to not feel yourself.  It is important for you to call and follow-up closely with your primary care doctor as well as your endocrinologist for further managements of your diabetes.  Make sure to take your home medications as prescribed.

## 2023-06-10 NOTE — ED Provider Notes (Signed)
[facility] Provider Note   CSN: [id number] Arrival date & time: 06/10/23  1706     History  Chief Complaint  Patient presents with   Multiple Complaints    Natalie Richard is a 57 y.o. female.  The history is provided by the patient and medical records. No language interpreter was used.     57 year old female significant history of diabetes, hypertension, hyperlipidemia, anxiety, NASH, skin cancer presenting with multiple complaints.  Patient report for the past 2 days she has not been feeling herself.  She mentions of her shaky, having sinus pressure, having recurrent diarrhea, head pressure, and feeling jittery.  She does not endorse any fever, no productive cough no sore throat no nausea vomiting diarrhea.  She does endorse some mild polyuria and polydipsia.  She mention her blood sugar has been running quite high for the past 1 to 2 months and her endocrinologist is currently aware of that.  She recently was placed on a Dexcom monitoring and blood sugars has been running in the 400s.  Her doctor has not adjusted her diabetic medication yet but she does have an appointment in the next week.  She also had her antidepressant medication changed 2 weeks ago.  She was on Celexa previously and now is currently taking Rexulti.  She denies any recent sick contact.  No report of any SI or HI.  Home Medications Prior to Admission medications   Medication Sig Start Date End Date Taking? Authorizing Provider  citalopram (CELEXA) 40 MG tablet TAKE 1 TABLET BY MOUTH EVERY DAY 04/08/23   Mozingo, Thereasa Solo, NP  BD PEN NEEDLE NANO 2ND GEN 32G X 4 MM MISC USE AS DIRECTED 08/09/22   Copland, Gwenlyn Found, MD  brexpiprazole (REXULTI) 2 MG TABS tablet Take 1 tablet (2 mg total) by mouth daily. 11/09/22   Mozingo, Thereasa Solo, NP  clonazePAM (KLONOPIN) 0.5 MG tablet Take 1 tablet (0.5 mg total) by mouth 2 (two) times daily. 11/09/22   Mozingo, Thereasa Solo, NP  Loratadine 10 MG CAPS Take by mouth.    [provider]  Multiple Vitamins-Minerals (MULTIVITAMIN WITH MINERALS) tablet Take 1 tablet by mouth daily.    [provider]  ONETOUCH VERIO test strip USE AS INSTRUCTED- MAY CHECK BLOOD SUGAR UP TO TWICE A DAY. 01/05/22   Copland, Gwenlyn Found, MD  pantoprazole (PROTONIX) 40 MG tablet TAKE 1 TABLET EVERY DAY 06/06/23   Copland, Gwenlyn Found, MD  TRESIBA FLEXTOUCH 200 UNIT/ML FlexTouch Pen Inject 75 Units into the skin in the morning and at bedtime.    [provider]      Allergies    Buspar [buspirone], Augmentin [amoxicillin-pot clavulanate], Biaxin [clarithromycin], Ceftin [cefuroxime], Ciprofloxacin, Dapagliflozin, Doxycycline, Levaquin [levofloxacin], Metformin and related, Phenazopyridine hcl, Pyridium [phenazopyridine hcl], Sulfa antibiotics, Latex, and Lorabid [loracarbef]    Review of Systems   Review of Systems  All other systems reviewed and are negative.   Physical Exam Updated Vital Signs BP 133/79 (BP Location: Left Arm)   Pulse 78   Temp 97.8 F (36.6 C) (Oral)   Resp (!) 21   Ht 5\' 6"  (1.676 m)   Wt 93.4 kg   SpO2 99%   BMI 33.25 kg/m  Physical Exam Vitals and nursing note reviewed.  Constitutional:      General: She is not in acute distress.    Appearance: She is well-developed.  HENT:     Head: Atraumatic.  Eyes:  Conjunctiva/sclera: Conjunctivae normal.  Cardiovascular:     Rate and Rhythm: Normal rate and regular rhythm.     Pulses: Normal pulses.     Heart sounds: Normal heart sounds.  Pulmonary:     Effort: Pulmonary effort is normal.  Abdominal:     Palpations: Abdomen is soft.     Tenderness: There is no abdominal tenderness.  Musculoskeletal:        General: Normal range of motion.     Cervical back: Normal range of motion and neck supple. No rigidity.  Skin:    Findings: No rash.  Neurological:     Mental Status: She is alert. Mental status is at baseline.   Psychiatric:        Mood and Affect: Mood normal.     ED Results / Procedures / Treatments   Labs (all labs ordered are listed, but only abnormal results are displayed) Labs Reviewed  BASIC METABOLIC PANEL - Abnormal; Notable for the following components:      Result Value   Sodium 133 (*)    Glucose, Bld 416 (*)    Calcium 8.4 (*)    All other components within normal limits  URINALYSIS, ROUTINE W REFLEX MICROSCOPIC - Abnormal; Notable for the following components:   Glucose, UA >=500 (*)    All other components within normal limits  URINALYSIS, MICROSCOPIC (REFLEX) - Abnormal; Notable for the following components:   Bacteria, UA RARE (*)    All other components within normal limits  CBG MONITORING, ED - Abnormal; Notable for the following components:   Glucose-Capillary 373 (*)    All other components within normal limits  CBG MONITORING, ED - Abnormal; Notable for the following components:   Glucose-Capillary 391 (*)    All other components within normal limits  I-STAT VENOUS BLOOD GAS, ED - Abnormal; Notable for the following components:   pO2, Ven 23 (*)    Calcium, Ion 1.13 (*)    All other components within normal limits  CBG MONITORING, ED - Abnormal; Notable for the following components:   Glucose-Capillary 339 (*)    All other components within normal limits  SARS CORONAVIRUS 2 BY RT PCR  CBC    EKG EKG Interpretation Date/Time:  Saturday June 10 2023 17:51:00 EDT Ventricular Rate:  75 PR Interval:  140 QRS Duration:  96 QT Interval:  500 QTC Calculation: 559 R Axis:   -6  Text Interpretation: Sinus rhythm Consider left atrial enlargement Borderline low voltage, extremity leads Abnormal R-wave progression, early transition Prolonged QT interval No significant change since last tracing Confirmed by Melene Plan [id number]) on 06/10/2023 9:03:12 PM ED ECG REPORT   Date: 06/10/2023  Rate: 75  Rhythm: normal sinus rhythm  QRS Axis: normal  Intervals: QT  prolonged  ST/T Wave abnormalities: normal  Conduction Disutrbances:none  Narrative Interpretation:   Old EKG Reviewed: unchanged  I have personally reviewed the EKG tracing and agree with the computerized printout as noted.   Radiology No results found.  Procedures Procedures    Medications Ordered in ED Medications  sodium chloride 0.9 % bolus 1,000 mL (0 mLs Intravenous Stopped 06/10/23 1906)  insulin aspart (novoLOG) injection 5 Units (5 Units Subcutaneous Given 06/10/23 1743)  gabapentin (NEURONTIN) capsule 100 mg (100 mg Oral Given 06/10/23 1812)  insulin aspart (novoLOG) injection 10 Units (10 Units Subcutaneous Given 06/10/23 1935)  sodium chloride 0.9 % bolus 1,000 mL (0 mLs Intravenous Stopped 06/10/23 2045)  ibuprofen (ADVIL) tablet 800 mg (800 mg Oral  Given 06/10/23 2010)    ED Course/ Medical Decision Making/ A&P                                 Medical Decision Making Amount and/or Complexity of Data Reviewed Labs: ordered.  Risk Prescription drug management.   BP 133/79 (BP Location: Left Arm)   Pulse 78   Temp 97.8 F (36.6 C) (Oral)   Resp (!) 21   Ht 5\' 6"  (1.676 m)   Wt 93.4 kg   SpO2 99%   BMI 33.25 kg/m   49:64 PM   57 year old female significant history of diabetes, hypertension, hyperlipidemia, anxiety, NASH, skin cancer presenting with multiple complaints.  Patient report for the past 2 days she has not been feeling herself.  She mentions of her shaky, having sinus pressure, having recurrent diarrhea, head pressure, and feeling jittery.  She does not endorse any fever, no productive cough no sore throat no nausea vomiting diarrhea.  She does endorse some mild polyuria and polydipsia.  She mention her blood sugar has been running quite high for the past 1 to 2 months and her endocrinologist is currently aware of that.  She recently was placed on a Dexcom monitoring and blood sugars has been running in the 400s.  Her doctor has not adjusted her diabetic  medication yet but she does have an appointment in the next week.  She also had her antidepressant medication changed 2 weeks ago.  She was on Celexa previously and now is currently taking Rexulti.  She denies any recent sick contact.  No report of any SI or HI.  On exam patient appears to be in no acute discomfort.  She does have a flat affect.  Heart with normal rate and rhythm, lungs are clear to auscultation bilaterally abdomen is soft nontender she is able to move all 4 extremities.  She is mentating appropriately.  -Labs ordered, independently viewed and interpreted by me.  Labs remarkable for initial CBG of 416, pt received IVF and a total of 15 units of insulin with mild improvement of her CBG.  Her CBG currently 339.  Pt without evidence of DKA or HHS.   -The patient was maintained on a cardiac monitor.  I personally viewed and interpreted the cardiac monitored which showed an underlying rhythm of: NSR -Imaging including maxillofacial CT considered but pt without significant sinus tenderness on percussion. Doubt sinusitis -This patient presents to the ED for concern of multiple complaints, this involves an extensive number of treatment options, and is a complaint that carries with it a high risk of complications and morbidity.  The differential diagnosis includes DKA, HHS, UTI, electrolytes imbalance -Co morbidities that complicate the patient evaluation includes DM, HTN, GERD, anxiety -Treatment includes IVF, insulin, gabapentin, ibuprofen -Reevaluation of the patient after these medicines showed that the patient improved -PCP office notes or outside notes reviewed -Discussion with attending Dr. Adela Lank.  Since pt's CBG is <500 and pt is not in DKA or HHS, she does not meet criteria for admission -Escalation to admission/observation considered: patient is reluctant to go home         Final Clinical Impression(s) / ED Diagnoses Final diagnoses:  Hyperglycemia    Rx / DC Orders ED  Discharge Orders     None         Fayrene Helper, PA-C 06/10/23 2113    Melene Plan, DO 06/10/23 2306

## 2023-06-10 NOTE — ED Notes (Signed)
Urine specimen in lab 

## 2023-06-10 NOTE — ED Triage Notes (Signed)
Pt has multiple complaints, including feeling shaky, hyperglycemia, sinus pressure, diarrhea, HA over the last few days

## 2023-06-10 NOTE — ED Notes (Signed)
PA notified of BGL

## 2023-06-12 ENCOUNTER — Telehealth: Payer: Self-pay | Admitting: Adult Health

## 2023-06-12 NOTE — Telephone Encounter (Signed)
Pt called at 2:03p.  She said she has been taking Trintellix, but she thinks it's not working (or not working as well) as Celexa.  She wants to go back to Celexa.  She want someone to advise her how to taper off the Trintellix.  Send Celexa to   [id number] [place], [facility] - [address] [place], [place] [address] Phone: [phone]  Fax: [phone]    Next appt 9/16

## 2023-06-12 NOTE — Telephone Encounter (Addendum)
From 8/19 visit:   Celexa 40mg  daily - discussed tapering off - take 1/2 tablet x 2 weeks then leave off   Add Trintellix 5mg  daily x 7 days, then increase to 10mg  daily - samples given  Patient reporting she is nervous all the time. Said she saw the commercials on TV for Trintellix and wanted to try it. Is now asking to taper off Trintellix and restart Celexa.  Please advise how to dose.

## 2023-06-13 NOTE — Telephone Encounter (Signed)
Patient has stopped the Trintellix, will not take any further. Has been on the 10 mg dose for approximately one week.  Told her to take 1/2 tablet of Celexa for 2 weeks and then increase it. She has an appt with Natalie Richard on 9/16.

## 2023-06-17 ENCOUNTER — Other Ambulatory Visit: Payer: Self-pay | Admitting: Adult Health

## 2023-06-17 DIAGNOSIS — F410 Panic disorder [episodic paroxysmal anxiety] without agoraphobia: Secondary | ICD-10-CM

## 2023-06-17 DIAGNOSIS — F3341 Major depressive disorder, recurrent, in partial remission: Secondary | ICD-10-CM

## 2023-06-18 NOTE — Telephone Encounter (Signed)
Appt tomorrow.

## 2023-06-19 ENCOUNTER — Encounter: Payer: Self-pay | Admitting: Adult Health

## 2023-06-19 ENCOUNTER — Ambulatory Visit: Payer: BC Managed Care – PPO | Admitting: Adult Health

## 2023-06-19 DIAGNOSIS — F331 Major depressive disorder, recurrent, moderate: Secondary | ICD-10-CM

## 2023-06-19 DIAGNOSIS — F410 Panic disorder [episodic paroxysmal anxiety] without agoraphobia: Secondary | ICD-10-CM

## 2023-06-19 DIAGNOSIS — F411 Generalized anxiety disorder: Secondary | ICD-10-CM | POA: Diagnosis not present

## 2023-06-19 DIAGNOSIS — F3341 Major depressive disorder, recurrent, in partial remission: Secondary | ICD-10-CM

## 2023-06-19 DIAGNOSIS — F428 Other obsessive-compulsive disorder: Secondary | ICD-10-CM

## 2023-06-19 MED ORDER — BREXPIPRAZOLE 2 MG PO TABS
2.0000 mg | ORAL_TABLET | Freq: Every day | ORAL | 5 refills | Status: DC
Start: 2023-06-19 — End: 2023-09-18

## 2023-06-19 MED ORDER — CITALOPRAM HYDROBROMIDE 40 MG PO TABS: 40.0000 mg | ORAL_TABLET | Freq: Every day | ORAL | 0 refills | Status: DC

## 2023-06-19 MED ORDER — CLONAZEPAM 0.5 MG PO TABS
0.5000 mg | ORAL_TABLET | Freq: Two times a day (BID) | ORAL | 2 refills | Status: DC
Start: 2023-06-19 — End: 2023-09-18

## 2023-06-19 NOTE — Progress Notes (Signed)
Natalie Richard [id number] 12-09-1965 57 y.o.  Subjective:   Patient ID:  Natalie Richard is a 57 y.o. (DOB [birth year]) female.  Chief Complaint: No chief complaint on file.   HPI ZEBA CERIO presents to the office today for follow-up of MDD, GAD, obsesional thoughts, panic disorder.  Describes mood today as "ok". Pleasant. Decreased tearfulness. Mood symptoms - denies depression, anxiety and irritability. Denies panic attacks. Reports worry, rumination, over thinking. Mood is lower. Stating "I think I could be better". 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 Varying interest and motivation. Taking medications as prescribed.  Energy levels lower. Active, does not have a regular exercise routine.  Enjoys some usual interests and activities. Married. Lives with husband - 1 cat - 1 dog. Spending time with family. Appetite increased.  Weight gain 196 to 208 pounds. Sleeps well most nights. Averages 8 or more hours. Focus and concentration "ok". Completing tasks. Managing aspects of household. Not working currently. Denies SI or HI.  Denies AH or VH. Denies self harm. Denies substance abuse.  Previously seen by Dr. Hayden Pedro  Previous medication trials: Abilify, Gabapentin, Kasandra Knudsen, Xanax, Effexor    PHQ2-9    Flowsheet Row Office Visit from 12/19/2022 in [facility] Primary Care at [facility] Office Visit from 12/15/2021 in [facility] Primary Care at [facility] Office Visit from 12/06/2021 in [facility] Primary Care at [facility] Office Visit from 09/02/2021 in [facility] Primary Care at [facility] Office Visit from 05/10/2021 in [facility] Primary Care at [facility]  PHQ-2 Total Score 0 0 0 0 2  PHQ-9 Total Score 0 0 -- 3 4      Flowsheet Row ED from 06/10/2023 in [facility] at [facility] ED from 06/20/2022 in [facility] Emergency Department at  [facility] ED from 12/15/2021 in [facility] Department at [facility]  C-SSRS RISK CATEGORY No Risk No Risk No Risk        Review of Systems:  Review of Systems  Musculoskeletal:  Negative for gait problem.  Neurological:  Negative for tremors.  Psychiatric/Behavioral:         Please refer to HPI    Medications: I have reviewed the patient's current medications.  Current Outpatient Medications  Medication Sig Dispense Refill   BD PEN NEEDLE NANO 2ND GEN 32G X 4 MM MISC USE AS DIRECTED 100 each 3   brexpiprazole (REXULTI) 2 MG TABS tablet Take 1 tablet (2 mg total) by mouth daily. 30 tablet 5   citalopram (CELEXA) 40 MG tablet Take 1 tablet (40 mg total) by mouth daily. 90 tablet 0   clonazePAM (KLONOPIN) 0.5 MG tablet Take 1 tablet (0.5 mg total) by mouth 2 (two) times daily. 60 tablet 2   Loratadine 10 MG CAPS Take by mouth.     Multiple Vitamins-Minerals (MULTIVITAMIN WITH MINERALS) tablet Take 1 tablet by mouth daily.     ONETOUCH VERIO test strip USE AS INSTRUCTED- MAY CHECK BLOOD SUGAR UP TO TWICE A DAY. 100 strip 12   pantoprazole (PROTONIX) 40 MG tablet TAKE 1 TABLET EVERY DAY 90 tablet 1   TRESIBA FLEXTOUCH 200 UNIT/ML FlexTouch Pen Inject 75 Units into the skin in the morning and at bedtime.     No current facility-administered medications for this visit.    Medication Side Effects: None  Allergies:  Allergies  Allergen Reactions   Buspar [  Buspirone] Other (See Comments)    Lip swelling and rash numbness    Augmentin [Amoxicillin-Pot Clavulanate] Nausea And Vomiting   Biaxin [Clarithromycin]    Ceftin [Cefuroxime] Swelling    Eye swelling, shortness of breath    Ciprofloxacin     Developed itching with IV form in the hospital, but has since taken PO without a problem   Dapagliflozin Other (See Comments)    Burning upon urination. Increased blood sugar.   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   Please see review of systems for further details on the patient's review from today.   Objective:   Physical Exam:  There were no vitals taken for this visit.  Physical Exam Constitutional:      General: She is not in acute distress. Musculoskeletal:        General: No deformity.  Neurological:     Mental Status: She is alert and oriented to person, place, and time.     Coordination: Coordination normal.  Psychiatric:        Attention and Perception: Attention and perception normal. She does not perceive auditory or visual hallucinations.        Mood and Affect: Affect is not labile, blunt, angry or inappropriate.        Speech: Speech normal.        Behavior: Behavior normal.        Thought Content: Thought content normal. Thought content is not paranoid or delusional. Thought content does not include homicidal or suicidal ideation. Thought content does not include homicidal or suicidal plan.        Cognition and Memory: Cognition and memory normal.        Judgment: Judgment normal.     Comments: Insight intact     Lab Review:     Component Value Date/Time   NA 136 06/10/2023 1756   NA 142 06/21/2022 0000   K 3.7 06/10/2023 1756   CL 99  06/10/2023 1736   CO2 23 06/10/2023 1736   GLUCOSE 416 (H) 06/10/2023 1736   BUN 8 06/10/2023 1736   BUN 9 06/21/2022 0000   CREATININE 0.89 06/10/2023 1736   CREATININE 0.81 12/19/2022 1538   CALCIUM 8.4 (L) 06/10/2023 1736   PROT 6.3 01/03/2022 1515   ALBUMIN 4.2 06/21/2022 0000   AST 86 (A) 06/21/2022 0000   ALT 70 (A) 06/21/2022 0000   ALKPHOS 191 (A) 06/21/2022 0000   BILITOT 0.4 01/03/2022 1515   GFRNONAA >60 06/10/2023 1736   GFRNONAA >89 04/08/2016 1426   GFRAA >60 04/02/2020 1243  GFRAA >89 04/08/2016 1426       Component Value Date/Time   WBC 7.0 06/10/2023 1736   RBC 4.83 06/10/2023 1736   HGB 14.6 06/10/2023 1756   HCT 43.0 06/10/2023 1756   PLT 314 06/10/2023 1736   MCV 88.0 06/10/2023 1736   MCV 78.8 (A) 03/29/2015 0938   MCH 29.0 06/10/2023 1736   MCHC 32.9 06/10/2023 1736   RDW 13.2 06/10/2023 1736   LYMPHSABS 2.0 06/20/2022 1453   MONOABS 0.5 06/20/2022 1453   EOSABS 0.0 06/20/2022 1453   BASOSABS 0.0 06/20/2022 1453    No results found for: "POCLITH", "LITHIUM"   No results found for: "PHENYTOIN", "PHENOBARB", "VALPROATE", "CBMZ"   .res Assessment: Plan:    Plan:  PDMP reviewed  Celexa 40mg  daily D/C Trintellix  Clonazepam 0.5mg  BID Rexulti 2mg  daily   RTC  3 months  Patient advised to contact office with any questions, adverse effects, or acute worsening in signs and symptoms.  Discussed potential benefits, risk, and side effects of benzodiazepines to include potential risk of tolerance and dependence, as well as possible drowsiness. Advised patient not to drive if experiencing drowsiness and to take lowest possible effective dose to minimize risk of dependence and tolerance.  Discussed potential metabolic side effects associated with atypical antipsychotics, as well as potential risk for movement side effects. Advised pt to contact office if movement side effects occur.   Diagnoses and all orders for this visit:  Major depressive  disorder, recurrent episode, moderate (HCC) -     brexpiprazole (REXULTI) 2 MG TABS tablet; Take 1 tablet (2 mg total) by mouth daily.  Generalized anxiety disorder  Panic disorder -     citalopram (CELEXA) 40 MG tablet; Take 1 tablet (40 mg total) by mouth daily. -     clonazePAM (KLONOPIN) 0.5 MG tablet; Take 1 tablet (0.5 mg total) by mouth 2 (two) times daily.  Obsessional thoughts  Recurrent major depressive disorder, in partial remission (HCC) -     citalopram (CELEXA) 40 MG tablet; Take 1 tablet (40 mg total) by mouth daily.     Please see After Visit Summary for patient specific instructions.  No future appointments.   No orders of the defined types were placed in this encounter.   -------------------------------

## 2023-06-21 LAB — HM DIABETES EYE EXAM

## 2023-08-09 ENCOUNTER — Other Ambulatory Visit: Payer: Self-pay | Admitting: Adult Health

## 2023-08-09 DIAGNOSIS — F3341 Major depressive disorder, recurrent, in partial remission: Secondary | ICD-10-CM

## 2023-08-09 DIAGNOSIS — F410 Panic disorder [episodic paroxysmal anxiety] without agoraphobia: Secondary | ICD-10-CM

## 2023-09-18 ENCOUNTER — Encounter: Payer: Self-pay | Admitting: Adult Health

## 2023-09-18 ENCOUNTER — Ambulatory Visit: Payer: BC Managed Care – PPO | Admitting: Adult Health

## 2023-09-18 DIAGNOSIS — F428 Other obsessive-compulsive disorder: Secondary | ICD-10-CM

## 2023-09-18 DIAGNOSIS — F411 Generalized anxiety disorder: Secondary | ICD-10-CM

## 2023-09-18 DIAGNOSIS — F410 Panic disorder [episodic paroxysmal anxiety] without agoraphobia: Secondary | ICD-10-CM | POA: Diagnosis not present

## 2023-09-18 DIAGNOSIS — F331 Major depressive disorder, recurrent, moderate: Secondary | ICD-10-CM | POA: Diagnosis not present

## 2023-09-18 MED ORDER — BREXPIPRAZOLE 2 MG PO TABS
2.0000 mg | ORAL_TABLET | Freq: Every day | ORAL | 5 refills | Status: DC
Start: 2023-09-18 — End: 2024-03-22

## 2023-09-18 MED ORDER — CLONAZEPAM 0.5 MG PO TABS
0.5000 mg | ORAL_TABLET | Freq: Two times a day (BID) | ORAL | 2 refills | Status: DC
Start: 2023-09-18 — End: 2024-03-22

## 2023-09-18 MED ORDER — CITALOPRAM HYDROBROMIDE 40 MG PO TABS
40.0000 mg | ORAL_TABLET | Freq: Every day | ORAL | 0 refills | Status: DC
Start: 2023-09-18 — End: 2024-02-11

## 2023-09-18 NOTE — Progress Notes (Signed)
Natalie Richard [id number] 28-Aug-1966 57 y.o.  Subjective:   Patient ID:  Natalie Richard is a 57 y.o. (DOB 1965-12-04) female.  Chief Complaint: No chief complaint on file.   HPI Natalie Richard presents to the office today for follow-up of MDD, GAD, obsesional thoughts, panic disorder.  Describes mood today as "ok". Pleasant. Decreased tearfulness. Mood symptoms - reports some depression, anxiety and irritability. Denies panic attacks. Denies worry, rumination, over thinking. Mood is stable. Stating "I think I'm doing ok". Feels like medications are helpful. Varying interest and motivation. Taking medications as prescribed.  Energy levels lower. Active, does not have a regular exercise routine.  Enjoys some usual interests and activities. Married. Lives with husband - 1 cat - 1 dog. Spending time with family. Appetite increased.  Weight loss - 200 pounds. Sleeps well most nights. Averages 12 hours. Focus and concentration "ok". Completing tasks. Managing aspects of household. Not working currently. Denies SI or HI.  Denies AH or VH. Denies self harm. Denies substance abuse.  Previously seen by Dr. Hayden Pedro  Previous medication trials: Abilify, Gabapentin, Kasandra Knudsen, Xanax, Effexor    PHQ2-9    Flowsheet Row Office Visit from 12/19/2022 in [facility] Primary Care at [facility] Office Visit from 12/15/2021 in [facility] Primary Care at [facility] Office Visit from 12/06/2021 in [facility] Primary Care at [facility] Office Visit from 09/02/2021 in [facility] Care at [facility] Office Visit from 05/10/2021 in [facility] Primary Care at [facility]  PHQ-2 Total Score 0 0 0 0 2  PHQ-9 Total Score 0 0 -- 3 4      Flowsheet Row ED from 06/10/2023 in [facility] Emergency Department at [facility] ED from 06/20/2022 in [facility] Emergency Department at [facility] ED from  12/15/2021 in [facility] Emergency Department at [facility]  C-SSRS RISK CATEGORY No Risk No Risk No Risk        Review of Systems:  Review of Systems  Musculoskeletal:  Negative for gait problem.  Neurological:  Negative for tremors.  Psychiatric/Behavioral:         Please refer to HPI    Medications: I have reviewed the patient's current medications.  Current Outpatient Medications  Medication Sig Dispense Refill   BD PEN NEEDLE NANO 2ND GEN 32G X 4 MM MISC USE AS DIRECTED 100 each 3   brexpiprazole (REXULTI) 2 MG TABS tablet Take 1 tablet (2 mg total) by mouth daily. 30 tablet 5   citalopram (CELEXA) 40 MG tablet TAKE 1 TABLET BY MOUTH EVERY DAY 90 tablet 0   clonazePAM (KLONOPIN) 0.5 MG tablet Take 1 tablet (0.5 mg total) by mouth 2 (two) times daily. 60 tablet 2   Loratadine 10 MG CAPS Take by mouth.     Multiple Vitamins-Minerals (MULTIVITAMIN WITH MINERALS) tablet Take 1 tablet by mouth daily.     ONETOUCH VERIO test strip USE AS INSTRUCTED- MAY CHECK BLOOD SUGAR UP TO TWICE A DAY. 100 strip 12   pantoprazole (PROTONIX) 40 MG tablet TAKE 1 TABLET EVERY DAY 90 tablet 1   TRESIBA FLEXTOUCH 200 UNIT/ML FlexTouch Pen Inject 75 Units into the skin in the morning and at bedtime.     No current facility-administered medications for this visit.    Medication Side Effects: None  Allergies:  Allergies  Allergen Reactions   Buspar [Buspirone] Other (See Comments)    Lip  swelling and rash numbness    Augmentin [Amoxicillin-Pot Clavulanate] Nausea And Vomiting   Biaxin [Clarithromycin]    Ceftin [Cefuroxime] Swelling    Eye swelling, shortness of breath    Ciprofloxacin     Developed itching with IV form in the hospital, but has since taken PO without a problem   Dapagliflozin Other (See Comments)    Burning upon urination. Increased blood sugar.   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   Please see review of systems for further details on the patient's review from today.   Objective:   Physical Exam:  There were no vitals taken for this visit.  Physical Exam Constitutional:      General: She is not in acute distress. Musculoskeletal:        General: No deformity.  Neurological:     Mental Status: She is alert and oriented to person, place, and time.     Coordination: Coordination normal.  Psychiatric:        Attention and Perception: Attention and perception normal. She does not perceive auditory or visual hallucinations.        Mood and Affect: Mood normal. Mood is not anxious or depressed. Affect is not labile, blunt, angry or inappropriate.        Speech: Speech normal.        Behavior: Behavior normal.        Thought Content: Thought content normal. Thought content is not paranoid or delusional. Thought content does not include homicidal or suicidal ideation. Thought content does not include homicidal or suicidal plan.        Cognition and Memory: Cognition and memory normal.        Judgment: Judgment normal.     Comments: Insight intact     Lab Review:     Component Value Date/Time   NA 136 06/10/2023 1756   NA 142 06/21/2022 0000   K 3.7 06/10/2023 1756   CL 99  06/10/2023 1736   CO2 23 06/10/2023 1736   GLUCOSE 416 (H) 06/10/2023 1736   BUN 8 06/10/2023 1736   BUN 9 06/21/2022 0000   CREATININE 0.89 06/10/2023 1736   CREATININE 0.81 12/19/2022 1538   CALCIUM 8.4 (L) 06/10/2023 1736   PROT 6.3 01/03/2022 1515   ALBUMIN 4.2 06/21/2022 0000   AST 86 (A) 06/21/2022 0000   ALT 70 (A) 06/21/2022 0000   ALKPHOS 191 (A) 06/21/2022 0000   BILITOT 0.4 01/03/2022 1515   GFRNONAA >60 06/10/2023 1736   GFRNONAA >89 04/08/2016 1426   GFRAA >60 04/02/2020 1243  GFRAA >89 04/08/2016 1426       Component Value Date/Time   WBC 7.0 06/10/2023 1736   RBC 4.83 06/10/2023 1736   HGB 14.6 06/10/2023 1756   HCT 43.0 06/10/2023 1756   PLT 314 06/10/2023 1736   MCV 88.0 06/10/2023 1736   MCV 78.8 (A) 03/29/2015 0938   MCH 29.0 06/10/2023 1736   MCHC 32.9 06/10/2023 1736   RDW 13.2 06/10/2023 1736   LYMPHSABS 2.0 06/20/2022 1453   MONOABS 0.5 06/20/2022 1453   EOSABS 0.0 06/20/2022 1453   BASOSABS 0.0 06/20/2022 1453    No results found for: "POCLITH", "LITHIUM"   No results found for: "PHENYTOIN", "PHENOBARB", "VALPROATE", "CBMZ"   .res Assessment: Plan:    Plan:  PDMP reviewed  Celexa 40mg  daily Clonazepam 0.5mg  BID Rexulti 2mg  daily   RTC  3 months  Patient advised to contact office with any questions, adverse effects, or acute worsening in signs and symptoms.  Discussed potential benefits, risk, and side effects of benzodiazepines to include potential risk of tolerance and dependence, as well as possible drowsiness. Advised patient not to drive if experiencing drowsiness and to take lowest possible effective dose to minimize risk of dependence and tolerance.  Discussed potential metabolic side effects associated with atypical antipsychotics, as well as potential risk for movement side effects. Advised pt to contact office if movement side effects occur.   There are no diagnoses linked to this encounter.   Please see After Visit  Summary for patient specific instructions.  Future Appointments  Date Time Provider Department Center  09/18/2023  1:00 PM [place], Thereasa Solo, NP [facility] None    No orders of the defined types were placed in this encounter.   -------------------------------

## 2023-12-02 ENCOUNTER — Other Ambulatory Visit: Payer: Self-pay | Admitting: Family Medicine

## 2023-12-20 ENCOUNTER — Ambulatory Visit: Payer: BC Managed Care – PPO | Admitting: Adult Health

## 2024-01-05 NOTE — Patient Instructions (Addendum)
 It was good to see you again today- I hope that your hip is much better soon Use the celebrex as needed for hip pain for the next 2-3 weeks Ok to use tylenol also but don't combine celebrex with ibuprofen or aleve If not getting better please let me know!

## 2024-01-05 NOTE — Progress Notes (Signed)
 [facility] at [facility] [address] [place], [place] [address] [phone]  Date:  01/08/2024   Name:  Natalie Richard   DOB:  04-16-1966   MRN:  [id number]  PCP:  Pearline Cables, MD    Chief Complaint: Medication Refill (Concerns/ questions: R leg issues, this is an issues that she has had before but it got better. Pain has worsened again in the past 2 weeks. Erroll Luna, dm urine due/Eye exam: sees Scientist, clinical (histocompatibility and immunogenetics) )   History of Present Illness:  Natalie Richard is a 58 y.o. very pleasant female patient who presents with the following:  Patient seen today for concern of hip pain.  Last seen by myself about a year and a half ago, September 2023 History of NASH, diabetes, HTN, hyperlipidemia  She is now under endocrinology care - Dr Talmage Nap She was in the ER this past September with a hyperglycemic episode- this has not occured again, she notes her diabetes control is improving  Wt Readings from Last 3 Encounters:  01/08/24 203 lb 9.6 oz (92.4 kg)  06/10/23 206 lb (93.4 kg)  03/31/23 204 lb (92.5 kg)   She also recently saw Dr. Loreta Ave for discussion of colon cancer screening- they will schedule this soon   Mammogram can be updated- pt notes this is done at [facility], most recent 01/20/21, she is advised to update this and plans to do so Pap smear-status post hysterectomy Shingrix complete Pt notes Dr Talmage Nap does her BW for her now   She has noted left leg/hip pain for 2 weeks- started out of the blue.  She describes the pain as dull, possible nerve pain The pain is waxing and waning, may be better if she rests-worse with extended walking Her back does not hurt No numbness or weakness in her leg  She has tried some Aleve that helps somewhat  She is on pantoprazole for GERD Patient Active Problem List   Diagnosis Date Noted   History of squamous cell carcinoma in situ (SCCIS) of skin 12/11/2019   NASH (nonalcoholic  steatohepatitis) 07/26/2015   BMI 39.0-39.9,adult 03/19/2012   Hematoma 03/19/2012   HTN (hypertension), benign 01/09/2012   Osteopenia 01/09/2012   Migraines 01/09/2012   Insomnia 01/09/2012   Anxiety and depression 01/09/2012   Hyperlipidemia 01/09/2012   Diabetes mellitus 01/09/2012   Allergic rhinitis 01/09/2012    Past Medical History:  Diagnosis Date   Anxiety    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   Depression    Diabetes mellitus    Diabetes mellitus, type II (HCC)    GERD (gastroesophageal reflux disease)    Hematoma    on buttock   Hyperlipidemia    Hypertension    Migraine    Seasonal allergies     Past Surgical History:  Procedure Laterality Date   ABDOMINAL HYSTERECTOMY  2004   PCOS   EYE SURGERY     INNER EAR SURGERY Left 08/2016    Social History   Tobacco Use   Smoking status: Never   Smokeless tobacco: Never  Vaping Use   Vaping status: Never Used  Substance Use Topics   Alcohol use: Not Currently    Alcohol/week: 8.0 - 10.0 standard drinks of alcohol    Types: 6 Cans of beer, 2 Shots of liquor per week   Drug use: No    Family History  Problem Relation Age of Onset  Cancer Mother 40       colon   Diabetes Mother    Rheum arthritis Mother    Dementia Mother    Depression Mother    Cancer Father 45       bone and prostate   Cancer Maternal Grandmother        breast   Diabetes Maternal Grandfather    Depression Cousin     Allergies  Allergen Reactions   Buspar [Buspirone] Other (See Comments)    Lip swelling and rash numbness    Augmentin [Amoxicillin-Pot Clavulanate] Nausea And Vomiting   Biaxin [Clarithromycin]    Ceftin [Cefuroxime] Swelling    Eye swelling, shortness of breath    Ciprofloxacin     Developed itching with IV form in the hospital, but has since taken PO without a problem   Dapagliflozin Other (See Comments)    Burning upon urination. Increased blood sugar.   Doxycycline Itching   Levaquin  [Levofloxacin] Hives   Metformin And Related Nausea And Vomiting   Phenazopyridine Hcl Other (See Comments)    Does not remember reaction   Pyridium [Phenazopyridine Hcl]     Does not remember reaction   Sulfa Antibiotics     As a younger person had itching, but has taken more recently and did ok   Latex Rash   Lorabid [Loracarbef] Rash    Medication list has been reviewed and updated.  Current Outpatient Medications on File Prior to Visit  Medication Sig Dispense Refill   BD PEN NEEDLE NANO 2ND GEN 32G X 4 MM MISC USE AS DIRECTED 100 each 3   brexpiprazole (REXULTI) 2 MG TABS tablet Take 1 tablet (2 mg total) by mouth daily. 30 tablet 5   citalopram (CELEXA) 40 MG tablet Take 1 tablet (40 mg total) by mouth daily. 90 tablet 0   clonazePAM (KLONOPIN) 0.5 MG tablet Take 1 tablet (0.5 mg total) by mouth 2 (two) times daily. 60 tablet 2   Continuous Glucose Sensor (DEXCOM G7 SENSOR) MISC      Loratadine 10 MG CAPS Take by mouth.     methimazole (TAPAZOLE) 10 MG tablet Take 10 mg by mouth daily.     MOUNJARO 7.5 MG/0.5ML Pen SMARTSIG:0.5 Milliliter(s) SUB-Q Once a Week     Multiple Vitamins-Minerals (MULTIVITAMIN WITH MINERALS) tablet Take 1 tablet by mouth daily.     ONETOUCH VERIO test strip USE AS INSTRUCTED- MAY CHECK BLOOD SUGAR UP TO TWICE A DAY. 100 strip 12   pantoprazole (PROTONIX) 40 MG tablet TAKE 1 TABLET EVERY DAY 90 tablet 1   No current facility-administered medications on file prior to visit.    Review of Systems:  As per HPI- otherwise negative. She is using aleve right now- it still hurts w walking   Physical Examination: Vitals:   01/08/24 1442  BP: 122/80  Pulse: 82  Resp: 18  Temp: 98.1 F (36.7 C)  SpO2: 97%   Vitals:   01/08/24 1442  Weight: 203 lb 9.6 oz (92.4 kg)  Height: 5\' 6"  (1.676 m)   Body mass index is 32.86 kg/m. Ideal Body Weight: Weight in (lb) to have BMI = 25: 154.6  GEN: no acute distress.  Central obesity, looks well HEENT:  Atraumatic, Normocephalic.  Ears and Nose: No external deformity. CV: RRR, No M/G/R. No JVD. No thrill. No extra heart sounds. PULM: CTA B, no wheezes, crackles, rhonchi. No retractions. No resp. distress. No accessory muscle use. ABD: S, NT, ND, +BS. No rebound. No  HSM. EXTR: No c/c/e PSYCH: Normally interactive. Conversant.  Normal thoracolumbar flexion and extension, no tenderness over the spine or sciatic notches bilaterally.  Able to raise up onto toes.  Normal strength, sensation, deep tendon reflex of both lower extremities.  Negative straight leg raise Patient displays mild tenderness over the left greater trochanteric bursa and also has some discomfort with resistance against her left hip flexor muscles.  Assessment and Plan: Left hip pain - Plan: celecoxib (CELEBREX) 200 MG capsule, DG Hip Unilat W OR W/O Pelvis 2-3 Views Left  HTN (hypertension), benign  Type 2 diabetes mellitus with hyperglycemia, with long-term current use of insulin (HCC)  Mixed hyperlipidemia  Gastroesophageal reflux disease, unspecified whether esophagitis present - Plan: pantoprazole (PROTONIX) 40 MG tablet  Patient seen today with concern of left hip pain.  Suspect this is a combination of hip flexor muscle soreness and possibly some mild bursitis.  However I hesitate to give her steroids since she has history of severe diabetes.  Will have her use Celebrex, cautioned about gastritis and GERD.  She is maintained on pantoprazole.  Blood pressure under good control  Will obtain plain films of her hip and get in touch with these results  Signed Abbe Amsterdam, MD

## 2024-01-08 ENCOUNTER — Encounter: Payer: Self-pay | Admitting: Family Medicine

## 2024-01-08 ENCOUNTER — Ambulatory Visit (HOSPITAL_BASED_OUTPATIENT_CLINIC_OR_DEPARTMENT_OTHER)
Admission: RE | Admit: 2024-01-08 | Discharge: 2024-01-08 | Disposition: A | Discharge status: Home / Self Care | Source: Ambulatory Visit | Attending: Family Medicine | Admitting: Family Medicine

## 2024-01-08 ENCOUNTER — Ambulatory Visit: Admitting: Family Medicine

## 2024-01-08 VITALS — BP 122/80 | HR 82 | Temp 98.1°F | Resp 18 | Ht 66.0 in | Wt 203.6 lb

## 2024-01-08 DIAGNOSIS — E782 Mixed hyperlipidemia: Secondary | ICD-10-CM

## 2024-01-08 DIAGNOSIS — M25552 Pain in left hip: Secondary | ICD-10-CM | POA: Diagnosis present

## 2024-01-08 DIAGNOSIS — Z794 Long term (current) use of insulin: Secondary | ICD-10-CM

## 2024-01-08 DIAGNOSIS — E1165 Type 2 diabetes mellitus with hyperglycemia: Secondary | ICD-10-CM | POA: Diagnosis not present

## 2024-01-08 DIAGNOSIS — I10 Essential (primary) hypertension: Secondary | ICD-10-CM

## 2024-01-08 DIAGNOSIS — K219 Gastro-esophageal reflux disease without esophagitis: Secondary | ICD-10-CM

## 2024-01-08 MED ORDER — PANTOPRAZOLE SODIUM 40 MG PO TBEC: 40.0000 mg | DELAYED_RELEASE_TABLET | Freq: Every day | ORAL | 3 refills | Status: AC

## 2024-01-08 MED ORDER — CELECOXIB 200 MG PO CAPS
200.0000 mg | ORAL_CAPSULE | Freq: Two times a day (BID) | ORAL | 1 refills | Status: DC | PRN
Start: 2024-01-08 — End: 2024-03-24

## 2024-02-09 ENCOUNTER — Other Ambulatory Visit: Payer: Self-pay | Admitting: Adult Health

## 2024-02-09 DIAGNOSIS — F410 Panic disorder [episodic paroxysmal anxiety] without agoraphobia: Secondary | ICD-10-CM

## 2024-02-19 ENCOUNTER — Ambulatory Visit: Admitting: Family Medicine

## 2024-02-19 VITALS — BP 118/88 | HR 94 | Temp 98.0°F | Ht 66.0 in | Wt 189.0 lb

## 2024-02-19 DIAGNOSIS — R051 Acute cough: Secondary | ICD-10-CM | POA: Diagnosis not present

## 2024-02-19 DIAGNOSIS — R432 Parageusia: Secondary | ICD-10-CM

## 2024-02-19 LAB — POC COVID19 BINAXNOW: SARS Coronavirus 2 Ag: NEGATIVE

## 2024-02-19 MED ORDER — HYDROCODONE BIT-HOMATROP MBR 5-1.5 MG/5ML PO SOLN: 5.0000 mL | Freq: Three times a day (TID) | ORAL | 0 refills | Status: AC | PRN

## 2024-02-19 MED ORDER — AMOXICILLIN 875 MG PO TABS: 875.0000 mg | ORAL_TABLET | Freq: Two times a day (BID) | ORAL | 0 refills | Status: DC

## 2024-02-19 NOTE — Patient Instructions (Addendum)
 Good to see you today Please let me know if you are not feeling better in the next few days If not seeing improvement by the end of the week you can start on the amoxicillin  antibiotic In the meantime can use the cough syrup- however be aware this will make you drowsy, do not drive after use or combine with clonazepam  or alcohol

## 2024-02-19 NOTE — Progress Notes (Signed)
 [facility] at [facility] [address] [place], [place] [address] [phone]  Date:  02/19/2024   Name:  Natalie Richard   DOB:  May 24, 1966   MRN:  [id number]  PCP:  Kaylee Partridge, MD    Chief Complaint: Cough (Started Thursday, cough - clear phlegm, runny nose, ears stopped, clammy, no taste/smell, no fever, no body aches, no sore throat)   History of Present Illness:  Natalie Richard is a 58 y.o. very pleasant female patient who presents with the following:  Pt seen today with concern of illness History of NASH, diabetes, HTN, hyperlipidemia  She is now under endocrinology care - Dr Ronelle Coffee Last seen by myself about 6 weeks ago with hip pain   She got sick 5 days ago with a cough- chest congestion, productive of clear phlegm, runny nose Feeling clammy but not definite fever or chills Ears are congested, headache No vomiting or diarrhea No ST except for a mild ST with her cough No sick contacts  She is most bothered by cough and runny nose   Lab Results  Component Value Date   HGBA1C 10.1 06/21/2022   Pt thinks her most A1c was about 10%    Patient Active Problem List   Diagnosis Date Noted   History of squamous cell carcinoma in situ (SCCIS) of skin 12/11/2019   NASH (nonalcoholic steatohepatitis) 07/26/2015   BMI 39.0-39.9,adult 03/19/2012   Hematoma 03/19/2012   HTN (hypertension), benign 01/09/2012   Osteopenia 01/09/2012   Migraines 01/09/2012   Insomnia 01/09/2012   Anxiety and depression 01/09/2012   Hyperlipidemia 01/09/2012   Diabetes mellitus 01/09/2012   Allergic rhinitis 01/09/2012    Past Medical History:  Diagnosis Date   Anxiety    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   Depression    Diabetes mellitus    Diabetes mellitus, type II (HCC)    GERD (gastroesophageal reflux disease)    Hematoma    on buttock   Hyperlipidemia    Hypertension    Migraine    Seasonal  allergies     Past Surgical History:  Procedure Laterality Date   ABDOMINAL HYSTERECTOMY  2004   PCOS   EYE SURGERY     INNER EAR SURGERY Left 08/2016    Social History   Tobacco Use   Smoking status: Never   Smokeless tobacco: Never  Vaping Use   Vaping status: Never Used  Substance Use Topics   Alcohol use: Not Currently    Alcohol/week: 8.0 - 10.0 standard drinks of alcohol    Types: 6 Cans of beer, 2 Shots of liquor per week   Drug use: No    Family History  Problem Relation Age of Onset   Cancer Mother 74       colon   Diabetes Mother    Rheum arthritis Mother    Dementia Mother    Depression Mother    Cancer Father 20       bone and prostate   Cancer Maternal Grandmother        breast   Diabetes Maternal Grandfather    Depression Cousin     Allergies  Allergen Reactions   Buspar [Buspirone] Other (See Comments)    Lip swelling and rash numbness    Augmentin [Amoxicillin -Pot Clavulanate] Nausea And Vomiting   Biaxin  [Clarithromycin ]    Ceftin [Cefuroxime] Swelling    Eye swelling, shortness of breath  Ciprofloxacin     Developed itching with IV form in the hospital, but has since taken PO without a problem   Dapagliflozin  Other (See Comments)    Burning upon urination. Increased blood sugar.   Doxycycline  Itching   Levaquin [Levofloxacin] Hives   Metformin And Related Nausea And Vomiting   Phenazopyridine Hcl Other (See Comments)    Does not remember reaction   Pyridium [Phenazopyridine Hcl]     Does not remember reaction   Sulfa Antibiotics     As a younger person had itching, but has taken more recently and did ok   Latex Rash   Lorabid [Loracarbef] Rash    Medication list has been reviewed and updated.  Current Outpatient Medications on File Prior to Visit  Medication Sig Dispense Refill   BD PEN NEEDLE NANO 2ND GEN 32G X 4 MM MISC USE AS DIRECTED 100 each 3   brexpiprazole  (REXULTI ) 2 MG TABS tablet Take 1 tablet (2 mg total) by  mouth daily. 30 tablet 5   celecoxib  (CELEBREX ) 200 MG capsule Take 1 capsule (200 mg total) by mouth 2 (two) times daily as needed. 45 capsule 1   citalopram  (CELEXA ) 40 MG tablet TAKE 1 TABLET BY MOUTH EVERY DAY 60 tablet 0   clonazePAM  (KLONOPIN ) 0.5 MG tablet Take 1 tablet (0.5 mg total) by mouth 2 (two) times daily. 60 tablet 2   Continuous Glucose Sensor (DEXCOM G7 SENSOR) MISC      Loratadine  10 MG CAPS Take by mouth.     methimazole (TAPAZOLE) 10 MG tablet Take 10 mg by mouth daily.     MOUNJARO 7.5 MG/0.5ML Pen SMARTSIG:0.5 Milliliter(s) SUB-Q Once a Week     Multiple Vitamins-Minerals (MULTIVITAMIN WITH MINERALS) tablet Take 1 tablet by mouth daily.     ONETOUCH VERIO test strip USE AS INSTRUCTED- MAY CHECK BLOOD SUGAR UP TO TWICE A DAY. 100 strip 12   pantoprazole  (PROTONIX ) 40 MG tablet Take 1 tablet (40 mg total) by mouth daily. 90 tablet 3   No current facility-administered medications on file prior to visit.    Review of Systems:  As per HPI- otherwise negative.   Physical Examination: Vitals:   02/19/24 1328  BP: 118/88  Pulse: 94  Temp: 98 F (36.7 C)  SpO2: 96%   Vitals:   02/19/24 1328  Weight: 189 lb (85.7 kg)  Height: 5\' 6"  (1.676 m)   Body mass index is 30.51 kg/m. Ideal Body Weight: Weight in (lb) to have BMI = 25: 154.6  GEN: no acute distress.  Obese, looks well  HEENT: Atraumatic, Normocephalic.  Bilateral TM wnl, oropharynx normal.  PEERL,EOMI.  Nasal cavity is slightly inflamed  Ears and Nose: No external deformity. CV: RRR, No M/G/R. No JVD. No thrill. No extra heart sounds. PULM: CTA B, no wheezes, crackles, rhonchi. No retractions. No resp. distress. No accessory muscle use. ABD: S, NT, ND EXTR: No c/c/e PSYCH: Normally interactive. Conversant.   Results for orders placed or performed in visit on 02/19/24  POC COVID-19 BinaxNow   Collection Time: 02/19/24  1:37 PM  Result Value Ref Range   SARS Coronavirus 2 Ag Negative Negative     Assessment and Plan: Acute cough - Plan: POC COVID-19 BinaxNow, HYDROcodone  bit-homatropine (HYCODAN) 5-1.5 MG/5ML syrup, amoxicillin  (AMOXIL ) 875 MG tablet  Loss of taste - Plan: POC COVID-19 BinaxNow Pt seen today with likely viral URI Covid negative Cough is her main issue- treat with hycodan as needed Gave an rx for amox-  pt notes she DOES tolerate plain amoxicillin  without any issue and has taken it in the last year or so.  She will hold onto the abx and use if not doing better by the end of the week  She will let me know if not getting better in a few days-Sooner if worse.  '  Signed Gates Kasal, MD

## 2024-03-05 ENCOUNTER — Other Ambulatory Visit: Payer: Self-pay | Admitting: Adult Health

## 2024-03-05 DIAGNOSIS — F410 Panic disorder [episodic paroxysmal anxiety] without agoraphobia: Secondary | ICD-10-CM

## 2024-03-22 ENCOUNTER — Ambulatory Visit: Admitting: Adult Health

## 2024-03-22 ENCOUNTER — Encounter: Payer: Self-pay | Admitting: Adult Health

## 2024-03-22 DIAGNOSIS — F411 Generalized anxiety disorder: Secondary | ICD-10-CM | POA: Diagnosis not present

## 2024-03-22 DIAGNOSIS — F428 Other obsessive-compulsive disorder: Secondary | ICD-10-CM | POA: Diagnosis not present

## 2024-03-22 DIAGNOSIS — F331 Major depressive disorder, recurrent, moderate: Secondary | ICD-10-CM | POA: Diagnosis not present

## 2024-03-22 DIAGNOSIS — F410 Panic disorder [episodic paroxysmal anxiety] without agoraphobia: Secondary | ICD-10-CM | POA: Diagnosis not present

## 2024-03-22 MED ORDER — BREXPIPRAZOLE 2 MG PO TABS: 2.0000 mg | ORAL_TABLET | Freq: Every day | ORAL | 5 refills | Status: DC

## 2024-03-22 MED ORDER — CLONAZEPAM 0.5 MG PO TABS: 0.5000 mg | ORAL_TABLET | Freq: Every day | ORAL | 2 refills | Status: DC

## 2024-03-22 MED ORDER — CITALOPRAM HYDROBROMIDE 40 MG PO TABS: 40.0000 mg | ORAL_TABLET | Freq: Every day | ORAL | 0 refills | Status: DC

## 2024-03-22 NOTE — Progress Notes (Signed)
 Natalie Richard [id number] Sep 04, 1966 58 y.o.  Subjective:   Patient ID:  Natalie Richard is a 58 y.o. (DOB 05/02/1966) female.  Chief Complaint: No chief complaint on file.   HPI Natalie Richard presents to the office today for follow-up of MDD, GAD, obsesional thoughts, panic disorder.  Describes mood today as ok. Pleasant. Decreased tearfulness. Mood symptoms - denies depression. Reports low interest and motivation - not wanting to get out and do things. Reports some anxiety and irritability. Denies panic attacks. Reports worry, rumination, over thinking. Reports mood is stable. Stating I feel like I'm doing ok. Feels like medications are helpful. Taking medications as prescribed.  Energy levels lower. Active, does not have a regular exercise routine.  Enjoys some usual interests and activities. Married. Lives with husband - 1 cat - 1 dog. Spending time with family. Appetite increased. Weight loss - 190 from 200 pounds. Sleeps well most nights. Averages 12 hours. Focus and concentration ok. Completing tasks. Managing aspects of household. Currently unemployed. Denies SI or HI.  Denies AH or VH. Denies self harm. Denies substance abuse.  Previously seen by Dr. Jerelene Monday  Previous medication trials: Abilify, Gabapentin , Latuda, Xanax , Effexor   PHQ2-9    Flowsheet Row Office Visit from 01/08/2024 in [facility] at [facility] Office Visit from 12/19/2022 in [facility] at [facility] Office Visit from 12/15/2021 in [facility] at [facility] Office Visit from 12/06/2021 in [facility] at [facility] Office Visit from 09/02/2021 in [facility] at [facility]  PHQ-2 Total Score 0 0 0 0 0  PHQ-9 Total Score 0 0 0 -- 3   Flowsheet Row ED from 06/10/2023 in [facility] Emergency Department at [facility] ED from 06/20/2022 in  [facility] Emergency Department at [facility] ED from 12/15/2021 in [facility] Emergency Department at [facility]  C-SSRS RISK CATEGORY No Risk No Risk No Risk     Review of Systems:  Review of Systems  Musculoskeletal:  Negative for gait problem.  Neurological:  Negative for tremors.  Psychiatric/Behavioral:         Please refer to HPI    Medications: I have reviewed the patient's current medications.  Current Outpatient Medications  Medication Sig Dispense Refill   amoxicillin  (AMOXIL ) 875 MG tablet Take 1 tablet (875 mg total) by mouth 2 (two) times daily. 20 tablet 0   BD PEN NEEDLE NANO 2ND GEN 32G X 4 MM MISC USE AS DIRECTED 100 each 3   brexpiprazole  (REXULTI ) 2 MG TABS tablet Take 1 tablet (2 mg total) by mouth daily. 30 tablet 5   celecoxib  (CELEBREX ) 200 MG capsule Take 1 capsule (200 mg total) by mouth 2 (two) times daily as needed. 45 capsule 1   citalopram  (CELEXA ) 40 MG tablet Take 1 tablet (40 mg total) by mouth daily. 90 tablet 0   clonazePAM  (KLONOPIN ) 0.5 MG tablet Take 1 tablet (0.5 mg total) by mouth daily. 30 tablet 2   Continuous Glucose Sensor (DEXCOM G7 SENSOR) MISC      Loratadine  10 MG CAPS Take by mouth.     methimazole (TAPAZOLE) 10 MG tablet Take 10 mg by mouth daily.     MOUNJARO 7.5 MG/0.5ML Pen SMARTSIG:0.5 Milliliter(s) SUB-Q Once a Week     Multiple Vitamins-Minerals (MULTIVITAMIN WITH MINERALS) tablet Take 1 tablet by mouth daily.  ONETOUCH VERIO test strip USE AS INSTRUCTED- MAY CHECK BLOOD SUGAR UP TO TWICE A DAY. 100 strip 12   pantoprazole  (PROTONIX ) 40 MG tablet Take 1 tablet (40 mg total) by mouth daily. 90 tablet 3   No current facility-administered medications for this visit.    Medication Side Effects: None  Allergies:  Allergies  Allergen Reactions   Buspar [Buspirone] Other (See Comments)    Lip swelling and rash numbness    Augmentin [Amoxicillin -Pot Clavulanate] Nausea And Vomiting   Biaxin   [Clarithromycin ]    Ceftin [Cefuroxime] Swelling    Eye swelling, shortness of breath    Ciprofloxacin     Developed itching with IV form in the hospital, but has since taken PO without a problem   Dapagliflozin  Other (See Comments)    Burning upon urination. Increased blood sugar.   Doxycycline  Itching   Levaquin [Levofloxacin] Hives   Metformin And Related Nausea And Vomiting   Phenazopyridine Hcl Other (See Comments)    Does not remember reaction   Pyridium [Phenazopyridine Hcl]     Does not remember reaction   Sulfa Antibiotics     As a younger person had itching, but has taken more recently and did ok   Latex Rash   Lorabid [Loracarbef] Rash    Past Medical History:  Diagnosis Date   Anxiety    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   Depression    Diabetes mellitus    Diabetes mellitus, type II (HCC)    GERD (gastroesophageal reflux disease)    Hematoma    on buttock   Hyperlipidemia    Hypertension    Migraine    Seasonal allergies     Past Medical History, Surgical history, Social history, and Family history were reviewed and updated as appropriate.   Please see review of systems for further details on the patient's review from today.   Objective:   Physical Exam:  There were no vitals taken for this visit.  Physical Exam Constitutional:      General: She is not in acute distress.  Musculoskeletal:        General: No deformity.   Neurological:     Mental Status: She is alert and oriented to person, place, and time.     Coordination: Coordination normal.   Psychiatric:        Attention and Perception: Attention and perception normal. She does not perceive auditory or visual hallucinations.        Mood and Affect: Mood normal. Mood is not anxious or depressed. Affect is not labile, blunt, angry or inappropriate.        Speech: Speech normal.        Behavior: Behavior normal.        Thought Content: Thought content normal. Thought content is  not paranoid or delusional. Thought content does not include homicidal or suicidal ideation. Thought content does not include homicidal or suicidal plan.        Cognition and Memory: Cognition and memory normal.        Judgment: Judgment normal.     Comments: Insight intact     Lab Review:     Component Value Date/Time   NA 136 06/10/2023 1756   NA 142 06/21/2022 0000   K 3.7 06/10/2023 1756   CL 99 06/10/2023 1736   CO2 23 06/10/2023 1736   GLUCOSE 416 (H) 06/10/2023 1736   BUN 8 06/10/2023 1736   BUN 9 06/21/2022 0000  CREATININE 0.89 06/10/2023 1736   CREATININE 0.81 12/19/2022 1538   CALCIUM  8.4 (L) 06/10/2023 1736   PROT 6.3 01/03/2022 1515   ALBUMIN 4.2 06/21/2022 0000   AST 86 (A) 06/21/2022 0000   ALT 70 (A) 06/21/2022 0000   ALKPHOS 191 (A) 06/21/2022 0000   BILITOT 0.4 01/03/2022 1515   GFRNONAA >60 06/10/2023 1736   GFRNONAA >89 04/08/2016 1426   GFRAA >60 04/02/2020 1243   GFRAA >89 04/08/2016 1426       Component Value Date/Time   WBC 7.0 06/10/2023 1736   RBC 4.83 06/10/2023 1736   HGB 14.6 06/10/2023 1756   HCT 43.0 06/10/2023 1756   PLT 314 06/10/2023 1736   MCV 88.0 06/10/2023 1736   MCV 78.8 (A) 03/29/2015 0938   MCH 29.0 06/10/2023 1736   MCHC 32.9 06/10/2023 1736   RDW 13.2 06/10/2023 1736   LYMPHSABS 2.0 06/20/2022 1453   MONOABS 0.5 06/20/2022 1453   EOSABS 0.0 06/20/2022 1453   BASOSABS 0.0 06/20/2022 1453    No results found for: POCLITH, LITHIUM   No results found for: PHENYTOIN, PHENOBARB, VALPROATE, CBMZ   .res Assessment: Plan:    Plan:  PDMP reviewed  Celexa  40mg  daily Clonazepam  0.5mg  BID Rexulti  2mg  daily   Reports a tremor that started a few months ago - it just happens and I can stop it. Reports occurrences 3 to 4 times a day. Advised to f/u with PCP.  RTC  3 months  Patient advised to contact office with any questions, adverse effects, or acute worsening in signs and symptoms.  Discussed potential  benefits, risk, and side effects of benzodiazepines to include potential risk of tolerance and dependence, as well as possible drowsiness. Advised patient not to drive if experiencing drowsiness and to take lowest possible effective dose to minimize risk of dependence and tolerance.  Discussed potential metabolic side effects associated with atypical antipsychotics, as well as potential risk for movement side effects. Advised pt to contact office if movement side effects occur.    Diagnoses and all orders for this visit:  Major depressive disorder, recurrent episode, moderate (HCC) -     brexpiprazole  (REXULTI ) 2 MG TABS tablet; Take 1 tablet (2 mg total) by mouth daily.  Panic disorder -     citalopram  (CELEXA ) 40 MG tablet; Take 1 tablet (40 mg total) by mouth daily. -     clonazePAM  (KLONOPIN ) 0.5 MG tablet; Take 1 tablet (0.5 mg total) by mouth daily.     Please see After Visit Summary for patient specific instructions.  No future appointments.  No orders of the defined types were placed in this encounter.   -------------------------------

## 2024-03-24 ENCOUNTER — Other Ambulatory Visit: Payer: Self-pay

## 2024-03-24 ENCOUNTER — Emergency Department (HOSPITAL_COMMUNITY)
Admission: EM | Admit: 2024-03-24 | Discharge: 2024-03-24 | Disposition: A | Discharge status: Home / Self Care | Attending: Emergency Medicine | Admitting: Emergency Medicine

## 2024-03-24 DIAGNOSIS — E876 Hypokalemia: Secondary | ICD-10-CM | POA: Insufficient documentation

## 2024-03-24 DIAGNOSIS — E860 Dehydration: Secondary | ICD-10-CM | POA: Diagnosis not present

## 2024-03-24 DIAGNOSIS — I959 Hypotension, unspecified: Secondary | ICD-10-CM | POA: Diagnosis present

## 2024-03-24 DIAGNOSIS — Z91040 Latex allergy status: Secondary | ICD-10-CM | POA: Diagnosis not present

## 2024-03-24 LAB — CBC WITH DIFFERENTIAL/PLATELET
Abs Immature Granulocytes: 0.02 10*3/uL (ref 0.00–0.07)
Basophils Absolute: 0 10*3/uL (ref 0.0–0.1)
Basophils Relative: 0 %
Eosinophils Absolute: 0 10*3/uL (ref 0.0–0.5)
Eosinophils Relative: 1 %
HCT: 39.5 % (ref 36.0–46.0)
Hemoglobin: 13 g/dL (ref 12.0–15.0)
Immature Granulocytes: 0 %
Lymphocytes Relative: 23 %
Lymphs Abs: 1.9 10*3/uL (ref 0.7–4.0)
MCH: 28.4 pg (ref 26.0–34.0)
MCHC: 32.9 g/dL (ref 30.0–36.0)
MCV: 86.4 fL (ref 80.0–100.0)
Monocytes Absolute: 0.6 10*3/uL (ref 0.1–1.0)
Monocytes Relative: 7 %
Neutro Abs: 5.5 10*3/uL (ref 1.7–7.7)
Neutrophils Relative %: 69 %
Platelets: 252 10*3/uL (ref 150–400)
RBC: 4.57 MIL/uL (ref 3.87–5.11)
RDW: 13.2 % (ref 11.5–15.5)
WBC: 8.1 10*3/uL (ref 4.0–10.5)
nRBC: 0 % (ref 0.0–0.2)

## 2024-03-24 LAB — URINALYSIS, ROUTINE W REFLEX MICROSCOPIC
Bilirubin Urine: NEGATIVE
Glucose, UA: 50 mg/dL — AB
Hgb urine dipstick: NEGATIVE
Ketones, ur: NEGATIVE mg/dL
Leukocytes,Ua: NEGATIVE
Nitrite: NEGATIVE
Protein, ur: NEGATIVE mg/dL
Specific Gravity, Urine: 1.006 (ref 1.005–1.030)
pH: 7 (ref 5.0–8.0)

## 2024-03-24 LAB — BASIC METABOLIC PANEL WITH GFR
Anion gap: 9 (ref 5–15)
BUN: 7 mg/dL (ref 6–20)
CO2: 22 mmol/L (ref 22–32)
Calcium: 7.9 mg/dL — ABNORMAL LOW (ref 8.9–10.3)
Chloride: 104 mmol/L (ref 98–111)
Creatinine, Ser: 1.04 mg/dL — ABNORMAL HIGH (ref 0.44–1.00)
GFR, Estimated: >60 mL/min (ref >60)
Glucose, Bld: 197 mg/dL — ABNORMAL HIGH (ref 70–99)
Potassium: 3 mmol/L — ABNORMAL LOW (ref 3.5–5.1)
Sodium: 135 mmol/L (ref 135–145)

## 2024-03-24 LAB — MAGNESIUM: Magnesium: 1.7 mg/dL (ref 1.7–2.4)

## 2024-03-24 MED ORDER — CALCIUM CARBONATE 1250 (500 CA) MG PO TABS
1.0000 | ORAL_TABLET | Freq: Once | ORAL | Status: AC
  Administered 2024-03-24: 1250 mg via ORAL
  Filled 2024-03-24: qty 1

## 2024-03-24 MED ORDER — POTASSIUM CHLORIDE CRYS ER 20 MEQ PO TBCR
40.0000 meq | EXTENDED_RELEASE_TABLET | Freq: Once | ORAL | Status: AC
Start: 2024-03-24 — End: 2024-03-24
  Administered 2024-03-24: 40 meq via ORAL
  Filled 2024-03-24: qty 2

## 2024-03-24 MED ORDER — SODIUM CHLORIDE 0.9 % IV BOLUS
1000.0000 mL | Freq: Once | INTRAVENOUS | Status: AC
  Administered 2024-03-24: 1000 mL via INTRAVENOUS

## 2024-03-24 NOTE — ED Triage Notes (Signed)
 BIBA from a bar for syncopal episode- Pt was standing up drinking a white claw and then passed out, was assisted to the ground by a friend. 60/40 initial BP, 82/60 after 500 cc NS 94% room air 80 HR 16 RR 20 g RAC 230 cbg

## 2024-03-24 NOTE — Discharge Instructions (Addendum)
 You have been seen and discharged from the emergency department.  You had a syncopal episode most likely due to low blood pressure.  I believe this is from dehydration and low electrolytes.  You were given IV medicine and electrolyte repletion.  It is important to stay hydrated with water for the next couple days.  Avoid dehydrating drinks like coffee/soda, alcohol.  Follow-up with your primary provider for further evaluation and further care.  You will need repeat blood work to evaluate electrolyte levels.  Take home medications as prescribed. If you have any worsening symptoms or further concerns for your health please return to an emergency department for further evaluation.

## 2024-03-24 NOTE — ED Provider Notes (Signed)
 [place] EMERGENCY DEPARTMENT AT [facility] Provider Note   CSN: [id number] Arrival date & time: 03/24/24  1742     Patient presents with: Hypotension and Loss of Consciousness   Natalie Richard is a 58 y.o. female.   HPI   58 year old female presents emergency department after syncopal episode.  Patient was standing at a bar while drinking a white call when she became pale, stated that she felt unwell and had syncope.  She was lowered to the ground by a bystander.  Noted to be hypotensive with EMS, slightly improved with a small fluid bolus.  Patient denies any acute illness, new medications, vomiting/diarrhea, change in health.  While laying in bed she feels improved but still fatigued.  Denied any chest pain, shortness of breath, headache, seizure-like activity, neurosymptoms.  Prior to Admission medications   Medication Sig Start Date End Date Taking? Authorizing Provider  brexpiprazole  (REXULTI ) 2 MG TABS tablet Take 1 tablet (2 mg total) by mouth daily. 03/22/24  Yes Mozingo, Regina Nattalie, NP  citalopram  (CELEXA ) 40 MG tablet Take 1 tablet (40 mg total) by mouth daily. 03/22/24  Yes Mozingo, Regina Nattalie, NP  clonazePAM  (KLONOPIN ) 0.5 MG tablet Take 1 tablet (0.5 mg total) by mouth daily. 03/22/24  Yes Mozingo, Regina Nattalie, NP  Loratadine  10 MG CAPS Take 10 mg by mouth daily.   Yes [provider]  methimazole (TAPAZOLE) 5 MG tablet Take 5 mg by mouth daily. 09/05/23  Yes [provider]  MOUNJARO 10 MG/0.5ML Pen Inject 10 mg into the skin once a week. Sundays 02/28/24  Yes [provider]  Multiple Vitamins-Minerals (MULTIVITAMIN WITH MINERALS) tablet Take 1 tablet by mouth daily.   Yes [provider]  pantoprazole  (PROTONIX ) 40 MG tablet Take 1 tablet (40 mg total) by mouth daily. 01/08/24  Yes Copland, Harlene BROCKS, MD  BD PEN NEEDLE NANO 2ND GEN 32G X 4 MM MISC USE AS DIRECTED 08/09/22   Copland, Harlene BROCKS, MD  Continuous  Glucose Sensor (DEXCOM G7 SENSOR) MISC  12/26/23   [provider]  ONETOUCH VERIO test strip USE AS INSTRUCTED- MAY CHECK BLOOD SUGAR UP TO TWICE A DAY. 01/05/22   Copland, Harlene BROCKS, MD    Allergies: Buspar [buspirone], Ceftin [cefuroxime], Biaxin  [clarithromycin ], Phenazopyridine hcl, Pyridium [phenazopyridine hcl], Sulfa antibiotics, Augmentin [amoxicillin -pot clavulanate], Ciprofloxacin, Dapagliflozin , Doxycycline , Latex, Levaquin [levofloxacin], Lorabid [loracarbef], and Metformin and related    Review of Systems  Constitutional:  Positive for diaphoresis and fatigue. Negative for fever.  Respiratory:  Negative for shortness of breath.   Cardiovascular:  Negative for chest pain.  Gastrointestinal:  Negative for abdominal pain, diarrhea and vomiting.  Skin:  Negative for rash.  Neurological:  Positive for syncope. Negative for headaches.    Updated Vital Signs BP 115/76 (BP Location: Left Arm)   Pulse 61   Temp 98.5 F (36.9 C) (Oral)   Resp 11   SpO2 95%   Physical Exam Vitals and nursing note reviewed.  Constitutional:      General: She is not in acute distress.    Appearance: Normal appearance.  HENT:     Head: Normocephalic.     Mouth/Throat:     Mouth: Mucous membranes are moist.   Eyes:     Pupils: Pupils are equal, round, and reactive to light.    Cardiovascular:     Rate and Rhythm: Normal rate.  Pulmonary:     Effort: Pulmonary effort is normal. No respiratory distress.  Abdominal:  Palpations: Abdomen is soft.     Tenderness: There is no abdominal tenderness.   Skin:    General: Skin is warm.   Neurological:     Mental Status: She is alert and oriented to person, place, and time. Mental status is at baseline.   Psychiatric:        Mood and Affect: Mood normal.     (all labs ordered are listed, but only abnormal results are displayed) Labs Reviewed  BASIC METABOLIC PANEL WITH GFR - Abnormal; Notable for the following components:       Result Value   Potassium 3.0 (*)    Glucose, Bld 197 (*)    Creatinine, Ser 1.04 (*)    Calcium  7.9 (*)    All other components within normal limits  CBC WITH DIFFERENTIAL/PLATELET  URINALYSIS, ROUTINE W REFLEX MICROSCOPIC    EKG: None  Radiology: No results found.   Procedures   Medications Ordered in the ED  sodium chloride  0.9 % bolus 1,000 mL (1,000 mLs Intravenous New Bag/Given 03/24/24 1915)                                    Medical Decision Making Amount and/or Complexity of Data Reviewed Labs: ordered.  Risk OTC drugs. Prescription drug management.   58 year old female presents emergency department after syncopal episode while standing and drinking a white cloth.  She was noted to look pale, was hypotensive with EMS.  Patient is slightly hypotensive on arrival but laying awake, conversational.  Heart rate is normal.  Afebrile.  Prior to this event she is been in her usual state of health.  Denies any preceding chest pain, shortness of breath, headache.  Blood work shows signs of mild dehydration, hypokalemia at 3 and hypocalcemia 7.9.  Magnesium  is normal.  Patient was given IV hydration as well as electrolyte replacement.  On reevaluation blood pressure is 120/79, she feels well and back to baseline.  Patient has been able to p.o.  I have encouraged oral hydration and outpatient follow-up with primary doctor for electrolyte reevaluation.  Patient understands.  Patient at this time appears safe and stable for discharge and close outpatient follow up. Discharge plan and strict return to ED precautions discussed, patient verbalizes understanding and agreement.     Final diagnoses:  None    ED Discharge Orders     None          Bari Roxie HERO, DO 03/24/24 2228

## 2024-04-02 NOTE — Progress Notes (Unsigned)
 [facility] at [facility] [address] [place], [place] [address] [phone]  Date:  04/03/2024   Name:  Natalie Richard   DOB:  Nov 21, 1965   MRN:  [id number]  PCP:  Watt Harlene BROCKS, MD    Chief Complaint: No chief complaint on file.   History of Present Illness:  LASHAWNA POCHE is a 58 y.o. very pleasant female patient who presents with the following:  Patient seen today for follow-up from recent ER visit.  I saw her most recently about 6 weeks ago for acute illness  History of NASH, diabetes, HTN, hyperlipidemia  She is now under endocrinology care - Dr Tommas  She was seen in the ER on 6/22 with syncope: 58 year old female presents emergency department after syncopal episode while standing and drinking a white claw.  She was noted to look pale, was hypotensive with EMS.  Patient is slightly hypotensive on arrival but laying awake, conversational.  Heart rate is normal.  Afebrile.  Prior to this event she is been in her usual state of health.  Denies any preceding chest pain, shortness of breath, headache. Blood work shows signs of mild dehydration, hypokalemia at 3 and hypocalcemia 7.9.  Magnesium  is normal.  Patient was given IV hydration as well as electrolyte replacement.  On reevaluation blood pressure is 120/79, she feels well and back to baseline. Patient has been able to p.o.  I have encouraged oral hydration and outpatient follow-up with primary doctor for electrolyte reevaluation.    Her potassium in the ER was 3.0, can recheck today.  She also had mild hypocalcemia and a slight bump in her creatinine-we will recheck these things today  Mammogram Patient Active Problem List   Diagnosis Date Noted   History of squamous cell carcinoma in situ (SCCIS) of skin 12/11/2019   NASH (nonalcoholic steatohepatitis) 07/26/2015   BMI 39.0-39.9,adult 03/19/2012   Hematoma 03/19/2012   HTN (hypertension), benign 01/09/2012    Osteopenia 01/09/2012   Migraines 01/09/2012   Insomnia 01/09/2012   Anxiety and depression 01/09/2012   Hyperlipidemia 01/09/2012   Diabetes mellitus 01/09/2012   Allergic rhinitis 01/09/2012    Past Medical History:  Diagnosis Date   Anxiety    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   Depression    Diabetes mellitus    Diabetes mellitus, type II (HCC)    GERD (gastroesophageal reflux disease)    Hematoma    on buttock   Hyperlipidemia    Hypertension    Migraine    Seasonal allergies     Past Surgical History:  Procedure Laterality Date   ABDOMINAL HYSTERECTOMY  2004   [facility]   EYE SURGERY     INNER EAR SURGERY Left 08/2016    Social History   Tobacco Use   Smoking status: Never   Smokeless tobacco: Never  Vaping Use   Vaping status: Never Used  Substance Use Topics   Alcohol use: Not Currently    Alcohol/week: 8.0 - 10.0 standard drinks of alcohol    Types: 6 Cans of beer, 2 Shots of liquor per week   Drug use: No    Family History  Problem Relation Age of Onset   Cancer Mother 6       colon   Diabetes Mother    Rheum arthritis Mother    Dementia Mother    Depression Mother    Cancer Father 49  bone and prostate   Cancer Maternal Grandmother        breast   Diabetes Maternal Grandfather    Depression Cousin     Allergies  Allergen Reactions   Buspar [Buspirone] Other (See Comments)    Lip swelling and rash numbness    Ceftin [Cefuroxime] Shortness Of Breath and Swelling    Eye swelling, shortness of breath    Biaxin  [Clarithromycin ] Other (See Comments)    Reaction type/severity unknown   Phenazopyridine Hcl Other (See Comments)    Reaction type/severity unknown    Pyridium [Phenazopyridine Hcl] Other (See Comments)    Reaction type/severity unknown    Sulfa Antibiotics Itching    Reaction > 28yrs ago, has tolerated PO use recently   Augmentin [Amoxicillin -Pot Clavulanate] Nausea And Vomiting   Ciprofloxacin Itching     Developed itching with IV form in the hospital, but has since taken PO without a problem   Dapagliflozin  Other (See Comments)    Burning upon urination. Increased blood sugar.   Doxycycline  Itching   Latex Rash   Levaquin [Levofloxacin] Hives   Lorabid [Loracarbef] Rash   Metformin And Related Nausea And Vomiting    Medication list has been reviewed and updated.  Current Outpatient Medications on File Prior to Visit  Medication Sig Dispense Refill   BD PEN NEEDLE NANO 2ND GEN 32G X 4 MM MISC USE AS DIRECTED 100 each 3   brexpiprazole  (REXULTI ) 2 MG TABS tablet Take 1 tablet (2 mg total) by mouth daily. 30 tablet 5   citalopram  (CELEXA ) 40 MG tablet Take 1 tablet (40 mg total) by mouth daily. 90 tablet 0   clonazePAM  (KLONOPIN ) 0.5 MG tablet Take 1 tablet (0.5 mg total) by mouth daily. 30 tablet 2   Continuous Glucose Sensor (DEXCOM G7 SENSOR) MISC      Loratadine  10 MG CAPS Take 10 mg by mouth daily.     methimazole (TAPAZOLE) 5 MG tablet Take 5 mg by mouth daily.     MOUNJARO 10 MG/0.5ML Pen Inject 10 mg into the skin once a week. Sundays     Multiple Vitamins-Minerals (MULTIVITAMIN WITH MINERALS) tablet Take 1 tablet by mouth daily.     ONETOUCH VERIO test strip USE AS INSTRUCTED- MAY CHECK BLOOD SUGAR UP TO TWICE A DAY. 100 strip 12   pantoprazole  (PROTONIX ) 40 MG tablet Take 1 tablet (40 mg total) by mouth daily. 90 tablet 3   No current facility-administered medications on file prior to visit.    Review of Systems:  As per HPI- otherwise negative.   Physical Examination: There were no vitals filed for this visit. There were no vitals filed for this visit. There is no height or weight on file to calculate BMI. Ideal Body Weight:    GEN: no acute distress. HEENT: Atraumatic, Normocephalic.  Ears and Nose: No external deformity. CV: RRR, No M/G/R. No JVD. No thrill. No extra heart sounds. PULM: CTA B, no wheezes, crackles, rhonchi. No retractions. No resp. distress. No  accessory muscle use. ABD: S, NT, ND, +BS. No rebound. No HSM. EXTR: No c/c/e PSYCH: Normally interactive. Conversant.  Foot exam  Assessment and Plan: ***  Signed Harlene Schroeder, MD

## 2024-04-03 ENCOUNTER — Ambulatory Visit: Admitting: Family Medicine

## 2024-04-03 ENCOUNTER — Encounter: Payer: Self-pay | Admitting: Family Medicine

## 2024-04-03 VITALS — BP 110/76 | HR 80 | Ht 66.0 in | Wt 184.8 lb

## 2024-04-03 DIAGNOSIS — E1165 Type 2 diabetes mellitus with hyperglycemia: Secondary | ICD-10-CM | POA: Diagnosis not present

## 2024-04-03 DIAGNOSIS — K7581 Nonalcoholic steatohepatitis (NASH): Secondary | ICD-10-CM | POA: Diagnosis not present

## 2024-04-03 DIAGNOSIS — Z7985 Long-term (current) use of injectable non-insulin antidiabetic drugs: Secondary | ICD-10-CM

## 2024-04-03 DIAGNOSIS — R55 Syncope and collapse: Secondary | ICD-10-CM

## 2024-04-03 DIAGNOSIS — E782 Mixed hyperlipidemia: Secondary | ICD-10-CM

## 2024-04-03 DIAGNOSIS — E876 Hypokalemia: Secondary | ICD-10-CM | POA: Diagnosis not present

## 2024-04-03 MED ORDER — TIRZEPATIDE 7.5 MG/0.5ML ~~LOC~~ SOAJ: 7.5000 mg | SUBCUTANEOUS | 2 refills | Status: AC

## 2024-04-03 NOTE — Patient Instructions (Signed)
 It was good to see you again today- I wonder if not eating much contributed to your fainting episode.  Let's scale your Mounajro back to 7.5 mg; please let Dr Tommas know when you see her I will make sure your potassium is back to normal today Be sure you are eating 3x a day and drinking fluids- some electrolyte powder is a good ideas as well during this hot weather (propel, gatorade packs that you can add to water)

## 2024-04-04 ENCOUNTER — Encounter: Payer: Self-pay | Admitting: Family Medicine

## 2024-04-04 LAB — COMPREHENSIVE METABOLIC PANEL WITH GFR
ALT: 14 U/L (ref 0–35)
AST: 14 U/L (ref 0–37)
Albumin: 4.4 g/dL (ref 3.5–5.2)
Alkaline Phosphatase: 126 U/L — ABNORMAL HIGH (ref 39–117)
BUN: 9 mg/dL (ref 6–23)
CO2: 23 meq/L (ref 19–32)
Calcium: 9.4 mg/dL (ref 8.4–10.5)
Chloride: 100 meq/L (ref 96–112)
Creatinine, Ser: 0.86 mg/dL (ref 0.40–1.20)
GFR: 73.78 mL/min (ref >60.00)
Glucose, Bld: 153 mg/dL — ABNORMAL HIGH (ref 70–99)
Potassium: 3.9 meq/L (ref 3.5–5.1)
Sodium: 137 meq/L (ref 135–145)
Total Bilirubin: 0.8 mg/dL (ref 0.2–1.2)
Total Protein: 7 g/dL (ref 6.0–8.3)

## 2024-04-16 ENCOUNTER — Other Ambulatory Visit (HOSPITAL_BASED_OUTPATIENT_CLINIC_OR_DEPARTMENT_OTHER): Payer: Self-pay | Admitting: Endocrinology

## 2024-04-16 DIAGNOSIS — E7800 Pure hypercholesterolemia, unspecified: Secondary | ICD-10-CM

## 2024-04-30 ENCOUNTER — Ambulatory Visit (HOSPITAL_COMMUNITY)
Admission: RE | Admit: 2024-04-30 | Discharge: 2024-04-30 | Disposition: A | Discharge status: Home / Self Care | Payer: Self-pay | Source: Ambulatory Visit | Attending: Endocrinology | Admitting: Endocrinology

## 2024-04-30 DIAGNOSIS — E7800 Pure hypercholesterolemia, unspecified: Secondary | ICD-10-CM | POA: Insufficient documentation

## 2024-05-27 ENCOUNTER — Inpatient Hospital Stay: Attending: Nurse Practitioner

## 2024-05-27 ENCOUNTER — Inpatient Hospital Stay (HOSPITAL_BASED_OUTPATIENT_CLINIC_OR_DEPARTMENT_OTHER): Admitting: Nurse Practitioner

## 2024-05-27 ENCOUNTER — Encounter: Payer: Self-pay | Admitting: Nurse Practitioner

## 2024-05-27 VITALS — BP 116/70 | HR 66 | Temp 97.9°F | Resp 17 | Wt 185.0 lb

## 2024-05-27 DIAGNOSIS — Z8042 Family history of malignant neoplasm of prostate: Secondary | ICD-10-CM | POA: Insufficient documentation

## 2024-05-27 DIAGNOSIS — D751 Secondary polycythemia: Secondary | ICD-10-CM

## 2024-05-27 DIAGNOSIS — Z800 Family history of malignant neoplasm of digestive organs: Secondary | ICD-10-CM | POA: Diagnosis not present

## 2024-05-27 DIAGNOSIS — Z808 Family history of malignant neoplasm of other organs or systems: Secondary | ICD-10-CM

## 2024-05-27 DIAGNOSIS — Z803 Family history of malignant neoplasm of breast: Secondary | ICD-10-CM | POA: Insufficient documentation

## 2024-05-27 LAB — CBC WITH DIFFERENTIAL (CANCER CENTER ONLY)
Abs Immature Granulocytes: 0.01 K/uL (ref 0.00–0.07)
Basophils Absolute: 0 K/uL (ref 0.0–0.1)
Basophils Relative: 1 %
Eosinophils Absolute: 0 K/uL (ref 0.0–0.5)
Eosinophils Relative: 0 %
HCT: 42.4 % (ref 36.0–46.0)
Hemoglobin: 14.6 g/dL (ref 12.0–15.0)
Immature Granulocytes: 0 %
Lymphocytes Relative: 37 %
Lymphs Abs: 2.6 K/uL (ref 0.7–4.0)
MCH: 28.8 pg (ref 26.0–34.0)
MCHC: 34.4 g/dL (ref 30.0–36.0)
MCV: 83.6 fL (ref 80.0–100.0)
Monocytes Absolute: 0.4 K/uL (ref 0.1–1.0)
Monocytes Relative: 5 %
Neutro Abs: 4 K/uL (ref 1.7–7.7)
Neutrophils Relative %: 57 %
Platelet Count: 301 K/uL (ref 150–400)
RBC: 5.07 MIL/uL (ref 3.87–5.11)
RDW: 13.2 % (ref 11.5–15.5)
WBC Count: 7.1 K/uL (ref 4.0–10.5)
nRBC: 0 % (ref 0.0–0.2)
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
## 2024-06-20 ENCOUNTER — Ambulatory Visit: Admitting: Adult Health

## 2024-07-01 ENCOUNTER — Encounter: Payer: Self-pay | Admitting: Adult Health

## 2024-07-01 ENCOUNTER — Telehealth: Admitting: Adult Health

## 2024-07-01 DIAGNOSIS — F411 Generalized anxiety disorder: Secondary | ICD-10-CM | POA: Diagnosis not present

## 2024-07-01 DIAGNOSIS — F331 Major depressive disorder, recurrent, moderate: Secondary | ICD-10-CM

## 2024-07-01 DIAGNOSIS — F410 Panic disorder [episodic paroxysmal anxiety] without agoraphobia: Secondary | ICD-10-CM | POA: Diagnosis not present

## 2024-07-01 DIAGNOSIS — F428 Other obsessive-compulsive disorder: Secondary | ICD-10-CM | POA: Diagnosis not present

## 2024-07-01 MED ORDER — CLONAZEPAM 0.5 MG PO TABS: 0.5000 mg | ORAL_TABLET | Freq: Every day | ORAL | 2 refills | Status: DC

## 2024-07-01 MED ORDER — CITALOPRAM HYDROBROMIDE 40 MG PO TABS: 40.0000 mg | ORAL_TABLET | Freq: Every day | ORAL | 0 refills | Status: DC
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
## 2024-07-24 ENCOUNTER — Encounter (HOSPITAL_BASED_OUTPATIENT_CLINIC_OR_DEPARTMENT_OTHER): Payer: Self-pay | Admitting: *Deleted

## 2024-07-24 ENCOUNTER — Emergency Department (HOSPITAL_BASED_OUTPATIENT_CLINIC_OR_DEPARTMENT_OTHER)
Admission: EM | Admit: 2024-07-24 | Discharge: 2024-07-24 | Disposition: A | Discharge status: Home / Self Care | Attending: Emergency Medicine | Admitting: Emergency Medicine

## 2024-07-24 ENCOUNTER — Emergency Department (HOSPITAL_BASED_OUTPATIENT_CLINIC_OR_DEPARTMENT_OTHER)

## 2024-07-24 ENCOUNTER — Ambulatory Visit: Payer: Self-pay | Admitting: *Deleted

## 2024-07-24 ENCOUNTER — Other Ambulatory Visit: Payer: Self-pay

## 2024-07-24 DIAGNOSIS — R519 Headache, unspecified: Secondary | ICD-10-CM | POA: Insufficient documentation

## 2024-07-24 DIAGNOSIS — R251 Tremor, unspecified: Secondary | ICD-10-CM | POA: Diagnosis present

## 2024-07-24 DIAGNOSIS — Z91040 Latex allergy status: Secondary | ICD-10-CM | POA: Diagnosis not present

## 2024-07-24 LAB — CBC
HCT: 43.6 % (ref 36.0–46.0)
Hemoglobin: 14.8 g/dL (ref 12.0–15.0)
MCH: 29.2 pg (ref 26.0–34.0)
MCHC: 33.9 g/dL (ref 30.0–36.0)
MCV: 86 fL (ref 80.0–100.0)
Platelets: 334 K/uL (ref 150–400)
RBC: 5.07 MIL/uL (ref 3.87–5.11)
RDW: 14.5 % (ref 11.5–15.5)
WBC: 7.2 K/uL (ref 4.0–10.5)
nRBC: 0 % (ref 0.0–0.2)

## 2024-07-24 LAB — URINALYSIS, ROUTINE W REFLEX MICROSCOPIC
Bilirubin Urine: NEGATIVE
Glucose, UA: 250 mg/dL — AB
Hgb urine dipstick: NEGATIVE
Ketones, ur: NEGATIVE mg/dL
Leukocytes,Ua: NEGATIVE
Nitrite: NEGATIVE
Protein, ur: NEGATIVE mg/dL
Specific Gravity, Urine: 1.025 (ref 1.005–1.030)
pH: 6 (ref 5.0–8.0)

## 2024-07-24 LAB — BASIC METABOLIC PANEL WITH GFR
Anion gap: 16 — ABNORMAL HIGH (ref 5–15)
BUN: 8 mg/dL (ref 6–20)
CO2: 21 mmol/L — ABNORMAL LOW (ref 22–32)
Calcium: 9.3 mg/dL (ref 8.9–10.3)
Chloride: 100 mmol/L (ref 98–111)
Creatinine, Ser: 0.82 mg/dL (ref 0.44–1.00)
GFR, Estimated: >60 mL/min (ref >60)
Glucose, Bld: 249 mg/dL — ABNORMAL HIGH (ref 70–99)
Potassium: 3.6 mmol/L (ref 3.5–5.1)
Sodium: 137 mmol/L (ref 135–145)

## 2024-07-24 MED ORDER — SODIUM CHLORIDE 0.9 % IV BOLUS
1000.0000 mL | Freq: Once | INTRAVENOUS | Status: AC
  Administered 2024-07-24: 1000 mL via INTRAVENOUS

## 2024-07-24 MED ORDER — HYDROXYZINE HCL 25 MG PO TABS
25.0000 mg | ORAL_TABLET | Freq: Once | ORAL | Status: AC
  Administered 2024-07-24: 25 mg via ORAL
  Filled 2024-07-24: qty 1

## 2024-07-24 MED ORDER — HYDROXYZINE HCL 25 MG PO TABS: 25.0000 mg | ORAL_TABLET | Freq: Three times a day (TID) | ORAL | 0 refills | Status: AC | PRN

## 2024-07-24 NOTE — ED Triage Notes (Signed)
 Pt states that she has had tremors since June and would like this evaluated.  Pt states that she feels jittery on the inside.  Tremor is in her right hand only.  Pt was seen by PCP and mentioned this to her but states that this was not addressed.

## 2024-07-24 NOTE — Discharge Instructions (Signed)
 It was a pleasure taking care of you here today  As we discussed in the room you need to follow-up with your primary care provider and neurology.  I have placed a referral.  If you do not hear from them the number resulted in your discharge paperwork.  Make sure to follow-up outpatient, return for new or worsening symptoms

## 2024-07-24 NOTE — Telephone Encounter (Signed)
FYI. Pt going to ED.  

## 2024-07-24 NOTE — Telephone Encounter (Signed)
 Recommended ED now , see NT encounter.      FYI Only or Action Required?: FYI only for provider.  Patient was last seen in primary care on 04/03/2024 by Copland, Harlene BROCKS, MD.  Called Nurse Triage reporting Tremors.  Symptoms began about a month ago.  Interventions attempted: Nothing.  Symptoms are: rapidly worsening.  Triage Disposition: Go to ED Now (Notify PCP)  Patient/caregiver understands and will follow disposition?: Yes            Copied from CRM [id number]. Topic: Clinical - Red Word Triage >> Jul 24, 2024  3:12 PM Roselie BROCKS wrote: [facility] that prompted transfer to Nurse Triage: Patient is having started  tremors Reason for Disposition  [1] Loss of speech or garbled speech AND [2] sudden onset AND [3] brief (now gone)  Answer Assessment - Initial Assessment Questions Recommended ED now due to sx of loss of speech at times but not now, feels like going to fall at times standing.     1. SYMPTOM: What is the main symptom you are concerned about? (e.g., weakness, numbness)     Tremors in hands , jittery feeling inside all day, not sleeping , dizziness 2. ONSET: When did this start? (e.g., minutes, hours, days; while sleeping)     1 month ago  3. LAST NORMAL: When was the last time you (the patient) were normal (no symptoms)?     Not for a while  4. PATTERN Does this come and go, or has it been constant since it started?  Is it present now?     Constant  5. CARDIAC SYMPTOMS: Have you had any of the following symptoms: chest pain, difficulty breathing, palpitations?     No  6. NEUROLOGIC SYMPTOMS: Have you had any of the following symptoms: headache, dizziness, vision loss, double vision, changes in speech, unsteady on your feet?     Dizziness at times with headache , loss of speech at times  happened yesterday .   7. OTHER SYMPTOMS: Do you have any other symptoms?     Tremors in hands ,  jittery feeling in side . Feels like going to fall  standing . Slow  or can't get words out.   8. PREGNANCY: Is there any chance you are pregnant? When was your last menstrual period?     na  Protocols used: Neurologic Deficit-A-AH

## 2024-07-24 NOTE — ED Provider Notes (Signed)
 [facility] Provider Note   CSN: [id number] Arrival date & time: 07/24/24  1610     Patient presents with: Tremors   Natalie Richard is a 58 y.o. female here for evaluation of tremors.  Patient states she has had a tremor to her right hand, occasionally left hand ongoing since June.  Symptoms worsened with movement, anxiety.  She is states she has known history of panic attacks.  States she sometimes will feel like she is shaking on the inside.  She is primarily concerned as she is right-hand-dominant and her shaking primarily affects her right hand.  No history of seizure-like disorder.  She denies any recent medication changes aside from new addition of Klonopin  for her anxiety which she takes once daily   HPI     Prior to Admission medications   Medication Sig Start Date End Date Taking? Authorizing Provider  BD PEN NEEDLE NANO 2ND GEN 32G X 4 MM MISC USE AS DIRECTED 08/09/22   Copland, Harlene BROCKS, MD  brexpiprazole  (REXULTI ) 2 MG TABS tablet Take 1 tablet (2 mg total) by mouth daily. 03/22/24   Mozingo, Regina Nattalie, NP  citalopram  (CELEXA ) 40 MG tablet Take 1 tablet (40 mg total) by mouth daily. 07/01/24   Mozingo, Regina Nattalie, NP  clonazePAM  (KLONOPIN ) 0.5 MG tablet Take 1 tablet (0.5 mg total) by mouth daily. 07/01/24   Mozingo, Regina Nattalie, NP  Continuous Glucose Sensor (DEXCOM G7 SENSOR) MISC  12/26/23   [provider]  Loratadine  10 MG CAPS Take 10 mg by mouth daily.    [provider]  methimazole (TAPAZOLE) 5 MG tablet Take 5 mg by mouth daily. 09/05/23   [provider]  Multiple Vitamins-Minerals (MULTIVITAMIN WITH MINERALS) tablet Take 1 tablet by mouth daily.    [provider]  ONETOUCH VERIO test strip USE AS INSTRUCTED- MAY CHECK BLOOD SUGAR UP TO TWICE A DAY. 01/05/22   Copland, Harlene BROCKS, MD  pantoprazole  (PROTONIX ) 40 MG tablet Take 1 tablet (40 mg total) by mouth daily.  01/08/24   Copland, Harlene BROCKS, MD  tirzepatide  (MOUNJARO ) 7.5 MG/0.5ML Pen Inject 7.5 mg into the skin once a week. 04/03/24   Copland, Harlene BROCKS, MD    Allergies: Buspar [buspirone], Ceftin [cefuroxime], Biaxin  [clarithromycin ], Phenazopyridine hcl, Pyridium [phenazopyridine hcl], Sulfa antibiotics, Augmentin [amoxicillin -pot clavulanate], Ciprofloxacin, Dapagliflozin , Doxycycline , Latex, Levaquin [levofloxacin], Lorabid [loracarbef], and Metformin and related    Review of Systems  Constitutional: Negative.   HENT: Negative.    Respiratory: Negative.    Cardiovascular: Negative.   Gastrointestinal: Negative.   Genitourinary: Negative.   Musculoskeletal: Negative.   Skin: Negative.   Neurological:  Positive for tremors and headaches. Negative for dizziness, seizures, syncope, facial asymmetry, speech difficulty, weakness, light-headedness and numbness.  All other systems reviewed and are negative.   Updated Vital Signs BP 128/82   Pulse 71   Temp 98.6 F (37 C) (Oral)   Resp 16   SpO2 99%   Physical Exam Physical Exam  Constitutional: Pt is oriented to person, place, and time. Pt appears well-developed and well-nourished. No distress.  HENT:  Head: Normocephalic and atraumatic.  Mouth/Throat: Oropharynx is clear and moist.  Eyes: Conjunctivae and EOM are normal. Pupils are equal, round, and reactive to light. No scleral icterus.  No horizontal, vertical or rotational nystagmus  Neck: Normal range of motion. Neck supple.  Full active and passive ROM without pain No midline or paraspinal tenderness No nuchal rigidity or  meningeal signs  Cardiovascular: Normal rate, regular rhythm and intact distal pulses.   Pulmonary/Chest: Effort normal and breath sounds normal. No respiratory distress. Pt has no wheezes. No rales.  Abdominal: Soft. Bowel sounds are normal. There is no tenderness. There is no rebound and no guarding.  Musculoskeletal: Normal range of motion.  Lymphadenopathy:     No cervical adenopathy.  Neurological: Pt. is alert and oriented to person, place, and time. He has normal reflexes. No cranial nerve deficit.  Exhibits normal muscle tone. Coordination normal.  Mental Status:  Alert, oriented, thought content appropriate. Speech fluent without evidence of aphasia. Able to follow 2 step commands without difficulty.  Cranial Nerves:  2-12 grossly intact Mild tremor right hand Motor:  5/5 in upper and lower extremities bilaterally including strong and equal grip strength and dorsiflexion/plantar flexion Sensory: Pinprick and light touch normal in all extremities.  Deep Tendon Reflexes: 2+ and symmetric  Cerebellar: normal finger-to-nose with bilateral upper extremities Gait: normal gait and balance CV: distal pulses palpable throughout   Skin: Skin is warm and dry. No rash noted. Pt is not diaphoretic.  Psychiatric: Pt has a normal mood and affect. Behavior is normal. Judgment and thought content normal.  Nursing note and vitals reviewed.  (all labs ordered are listed, but only abnormal results are displayed) Labs Reviewed  BASIC METABOLIC PANEL WITH GFR - Abnormal; Notable for the following components:      Result Value   CO2 21 (*)    Glucose, Bld 249 (*)    Anion gap 16 (*)    All other components within normal limits  URINALYSIS, ROUTINE W REFLEX MICROSCOPIC - Abnormal; Notable for the following components:   Glucose, UA 250 (*)    All other components within normal limits  CBC    EKG: None  Radiology: CT Head Wo Contrast Result Date: 07/24/2024 CLINICAL DATA:  Headache tremors EXAM: CT HEAD WITHOUT CONTRAST TECHNIQUE: Contiguous axial images were obtained from the base of the skull through the vertex without intravenous contrast. RADIATION DOSE REDUCTION: This exam was performed according to the departmental dose-optimization program which includes automated exposure control, adjustment of the mA and/or kV according to patient size and/or use  of iterative reconstruction technique. COMPARISON:  None Available. FINDINGS: Brain: No evidence of acute infarction, hemorrhage, hydrocephalus, extra-axial collection or mass lesion/mass effect. Vascular: No hyperdense vessel or unexpected calcification. Skull: Normal. Negative for fracture or focal lesion. Sinuses/Orbits: No acute finding. Other: None IMPRESSION: No CT evidence for acute intracranial abnormality Electronically Signed   By: Luke Bun M.D.   On: 07/24/2024 20:56     Procedures   Medications Ordered in the ED  hydrOXYzine  (ATARAX ) tablet 25 mg (25 mg Oral Given 07/24/24 2004)  sodium chloride  0.9 % bolus 1,000 mL (0 mLs Intravenous Stopped 07/24/24 2265)    58 year old here for evaluation of tremor which has been going on since June, approximately 5 to 6 months ago.  Symptoms have not changed.  Typically occur to right hand however occasionally occurred in the left hand.  Worse with her anxiety.  She is on Klonopin  and follows with therapy for this.  She states she occasionally has headaches however denies any blurred vision, slurred speech, numbness, weakness, sudden onset thunderclap headache, neck pain or neck stiffness.  She called her PCP and was told to come to the emergency department.  Here she has a nonfocal exam aside from a mild tremor to her right hand however her tremor does seem to  dissipate when talking with the patient.  Does not appear to have focal seizure-like activity.  Will plan on checking labs, head CT  Labs and imaging personally viewed and interpreted:  CBC without leukocytosis Metabolic panel glucose 249, anion gap 16, will give some IV fluids-low suspicion for DKA. UA negative for infection, no ketonuria CT head without significant abnormality  Patient was given hydroxyzine  here with significant improvement in her symptoms.  Question anxiety related.  She does have known history of panic disorder.  Given the length of symptoms I have placed referral  to neurology.  Will have her follow-up outpatient for this.  I also discussed finding with her PCP and her psychiatrist to rule out other etiologies of her tremor.  She will return for any worsening symptoms.  The patient has been appropriately medically screened and/or stabilized in the ED. I have low suspicion for any other emergent medical condition which would require further screening, evaluation or treatment in the ED or require inpatient management.  Patient is hemodynamically stable and in no acute distress.  Patient able to ambulate in department prior to ED.  Evaluation does not show acute pathology that would require ongoing or additional emergent interventions while in the emergency department or further inpatient treatment.  I have discussed the diagnosis with the patient and answered all questions.  Pain is been managed while in the emergency department and patient has no further complaints prior to discharge.  Patient is comfortable with plan discussed in room and is stable for discharge at this time.  I have discussed strict return precautions for returning to the emergency department.  Patient was encouraged to follow-up with PCP/specialist refer to at discharge.                                   Medical Decision Making Amount and/or Complexity of Data Reviewed External Data Reviewed: labs, radiology and notes. Labs: ordered. Decision-making details documented in ED Course. Radiology: ordered and independent interpretation performed. Decision-making details documented in ED Course.  Risk OTC drugs. Prescription drug management. Decision regarding hospitalization. Diagnosis or treatment significantly limited by social determinants of health.       Final diagnoses:  Tremors of nervous system    ED Discharge Orders          Ordered    Ambulatory referral to Neurology       Comments: An appointment is requested in approximately: 2 weeks  tremors   07/24/24 2209                Lashia Niese A, PA-C 07/24/24 2225    Bernard Drivers, MD 07/24/24 2320

## 2024-07-24 NOTE — ED Notes (Signed)
 Tremors Reports Pt.  That have been going on since June 2025 and she reports when she gets nervous it gets worse.  Pt. Reports she felt jittery.  Pt. Report her husband wanted her to get check out so she called her Dr. Lawerance and the PCP said to go to ED.  Pt. Has R hand shaking and she said she is restless.  She reports in the last month she just feels like she can't be still.

## 2024-10-24 ENCOUNTER — Other Ambulatory Visit: Payer: Self-pay | Admitting: Adult Health

## 2024-10-24 DIAGNOSIS — F410 Panic disorder [episodic paroxysmal anxiety] without agoraphobia: Secondary | ICD-10-CM

## 2024-10-24 NOTE — Telephone Encounter (Signed)
 Please call pt to schedule her appt. Was supposed to return in Dec.  Thank you

## 2024-10-25 NOTE — Telephone Encounter (Signed)
 LVM @ 4:29p to call and schedule appt

## 2024-11-04 ENCOUNTER — Encounter: Payer: Self-pay | Admitting: Adult Health

## 2024-11-04 ENCOUNTER — Telehealth: Admitting: Adult Health

## 2024-11-04 DIAGNOSIS — F410 Panic disorder [episodic paroxysmal anxiety] without agoraphobia: Secondary | ICD-10-CM

## 2024-11-04 DIAGNOSIS — F331 Major depressive disorder, recurrent, moderate: Secondary | ICD-10-CM

## 2024-11-04 MED ORDER — CITALOPRAM HYDROBROMIDE 40 MG PO TABS: 40.0000 mg | ORAL_TABLET | Freq: Every day | ORAL | 1 refills | Status: AC

## 2024-11-04 MED ORDER — BREXPIPRAZOLE 2 MG PO TABS: 2.0000 mg | ORAL_TABLET | Freq: Every day | ORAL | 5 refills | Status: AC

## 2024-11-04 MED ORDER — CLONAZEPAM 0.5 MG PO TABS: 0.5000 mg | ORAL_TABLET | Freq: Every day | ORAL | 2 refills | Status: AC

## 2024-11-08 ENCOUNTER — Telehealth: Payer: Self-pay | Admitting: Adult Health

## 2024-11-08 NOTE — Telephone Encounter (Signed)
 Clonazepam  0.5mg  BID - will take both tablets every morning - has only been taking once daily. Will call back on Friday, 02/0 with an update. Will send in new script after speaking with patient on Friday.   Pt called to report she is still having a high degree of anxiety, rates it an 8 with the increased dose. Reports there are new stressors, but did not provide any details. I asked her if she felt like she needed clonazepam  during the day and she said she didn't know. She reports the increased dose does help some, just not enough.

## 2024-11-08 NOTE — Telephone Encounter (Signed)
 Pt will take 1 clonazepam  in the AM and 1 later in the day to see how that does for her anxiety and let us  know on Monday how she did over the weekend.

## 2024-11-08 NOTE — Telephone Encounter (Signed)
 Pt called at 2:59p stating [facility] told her to call to talk to [place].  Pls call her back

## 2024-12-26 ENCOUNTER — Institutional Professional Consult (permissible substitution): Admitting: Neurology

## 2025-02-03 ENCOUNTER — Telehealth: Admitting: Adult Health
